# Patient Record
Sex: Male | Born: 1955 | Race: White | Hispanic: No | Marital: Married | Smoking: Never smoker
Health system: Southern US, Community
[De-identification: ages and names within clinical notes are randomized; demographics above are authoritative.]

## PROBLEM LIST (undated history)

## (undated) DIAGNOSIS — C01 Malignant neoplasm of base of tongue: Principal | ICD-10-CM

## (undated) DIAGNOSIS — G4730 Sleep apnea, unspecified: Secondary | ICD-10-CM

## (undated) DIAGNOSIS — Z8719 Personal history of other diseases of the digestive system: Secondary | ICD-10-CM

## (undated) DIAGNOSIS — G4733 Obstructive sleep apnea (adult) (pediatric): Secondary | ICD-10-CM

## (undated) DIAGNOSIS — R5383 Other fatigue: Secondary | ICD-10-CM

## (undated) DIAGNOSIS — Z9989 Dependence on other enabling machines and devices: Secondary | ICD-10-CM

## (undated) DIAGNOSIS — K222 Esophageal obstruction: Secondary | ICD-10-CM

## (undated) DIAGNOSIS — R413 Other amnesia: Secondary | ICD-10-CM

## (undated) DIAGNOSIS — A159 Respiratory tuberculosis unspecified: Secondary | ICD-10-CM

## (undated) DIAGNOSIS — Z8711 Personal history of peptic ulcer disease: Secondary | ICD-10-CM

## (undated) DIAGNOSIS — Z923 Personal history of irradiation: Secondary | ICD-10-CM

## (undated) DIAGNOSIS — J329 Chronic sinusitis, unspecified: Secondary | ICD-10-CM

## (undated) DIAGNOSIS — E079 Disorder of thyroid, unspecified: Secondary | ICD-10-CM

## (undated) HISTORY — PX: UPPER GASTROINTESTINAL ENDOSCOPY: SHX188

## (undated) HISTORY — DX: Malignant neoplasm of base of tongue: C01

## (undated) HISTORY — DX: Other fatigue: R53.83

## (undated) HISTORY — PX: FOOT FRACTURE SURGERY: SHX645

## (undated) HISTORY — DX: Chronic sinusitis, unspecified: J32.9

## (undated) HISTORY — DX: Personal history of irradiation: Z92.3

## (undated) HISTORY — PX: APPENDECTOMY: SHX54

## (undated) HISTORY — PX: HEEL SPUR SURGERY: SHX665

## (undated) HISTORY — PX: INTERPHALANGEAL JOINT ARTHROPLASTY: SUR71

## (undated) HISTORY — DX: Disorder of thyroid, unspecified: E07.9

---

## 1975-02-08 HISTORY — PX: CHEST WALL TUMOR EXCISION: SUR562

## 1980-02-08 HISTORY — PX: VASECTOMY: SHX75

## 1982-02-07 DIAGNOSIS — A159 Respiratory tuberculosis unspecified: Secondary | ICD-10-CM

## 1982-02-07 HISTORY — DX: Respiratory tuberculosis unspecified: A15.9

## 1997-12-06 ENCOUNTER — Emergency Department (HOSPITAL_COMMUNITY): Admission: EM | Admit: 1997-12-06 | Discharge: 1997-12-06 | Payer: Self-pay | Admitting: Emergency Medicine

## 1998-06-05 ENCOUNTER — Ambulatory Visit: Admission: RE | Admit: 1998-06-05 | Discharge: 1998-06-05 | Payer: Self-pay | Admitting: *Deleted

## 1999-05-15 ENCOUNTER — Encounter: Payer: Self-pay | Admitting: Emergency Medicine

## 1999-05-15 ENCOUNTER — Emergency Department (HOSPITAL_COMMUNITY): Admission: EM | Admit: 1999-05-15 | Discharge: 1999-05-15 | Payer: Self-pay | Admitting: *Deleted

## 2006-07-06 ENCOUNTER — Emergency Department (HOSPITAL_COMMUNITY): Admission: EM | Admit: 2006-07-06 | Discharge: 2006-07-07 | Payer: Self-pay | Admitting: Emergency Medicine

## 2007-02-15 ENCOUNTER — Ambulatory Visit (HOSPITAL_BASED_OUTPATIENT_CLINIC_OR_DEPARTMENT_OTHER): Admission: RE | Admit: 2007-02-15 | Discharge: 2007-02-15 | Payer: Self-pay | Admitting: Orthopedic Surgery

## 2010-02-03 ENCOUNTER — Emergency Department (HOSPITAL_COMMUNITY)
Admission: EM | Admit: 2010-02-03 | Discharge: 2010-02-03 | Payer: Self-pay | Source: Home / Self Care | Admitting: Emergency Medicine

## 2010-06-22 NOTE — Op Note (Signed)
NAME:  [name], [name]               ACCOUNT NO.:  [id number]   MEDICAL RECORD NO.:  [id number]          PATIENT TYPE:  AMB   LOCATION:  DSC                          FACILITY:  [facility]   PHYSICIAN:  Katy Fitch. Sypher, M.D. DATE OF BIRTH:  October 15, 1955   DATE OF PROCEDURE:  02/15/2007  DATE OF DISCHARGE:                               OPERATIVE REPORT   PREOPERATIVE DIAGNOSES:  1. Severe pain right long finger metacarpal phalangeal joint with loss      of range of motion due to bone-on-bone arthropathy.  2. 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   POSTOPERATIVE DIAGNOSES:  1. Severe pain right long finger metacarpal phalangeal joint with loss      of range of motion due to bone-on-bone arthropathy.  2. 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   OPERATION:  1. Reconstruction of right long finger metacarpal phalangeal joint      utilizing a size 30 DePuy NeuFlex implant arthroplasty.  2. Injection of right ring finger metacarpal phalangeal joint.   OPERATIONS:  Katy Fitch. Sypher, M.D.   ASSISTANT:  Molly Maduro Dasnoit PA-C.   ANESTHESIA:  General by LMA.   SUPERVISING ANESTHESIOLOGIST:  Dr. Jairo Ben.   INDICATIONS:  [name] [name] is a 55 year old gentleman referred through  the courtesy of Dr. Marcene Corning for evaluation and management of  relentless pain in the right long and ring finger metacarpal phalangeal  joints.  [name] [name] had not responded to nonoperative measures including  activity modification, anti-inflammatory medication and injection  therapy.  Clinical examination revealed signs of subluxation of the  right long finger metacarpal phalangeal joint, intrinsic tightness and  loss of range of motion compared with the opposite left long finger.  Plain x-rays including a [place] view demonstrated significant  subluxation and narrowing of the joint consistent with advanced  osteoarthrosis.   Due to a failure to respond to nonoperative measures, he is brought to  the operating room at this time for reconstruction of his right long  finger metacarpal phalangeal joint.   Due to a similar predicament in the ring finger without evidence of bone-  on-bone arthropathy, we recommended injecting the ring finger metacarpal  phalangeal joint with steroid while under anesthesia.   Preoperatively, questions were invited and answered in detail with Mr.  [name] and his wife [name] [name].   PROCEDURE:  [name] [name] is brought to the operating room and placed in  the supine position upon the table.  Following an anesthesia consult  with Dr. Jairo Ben, general anesthesia by LMA technique was  advised and accepted by [name] [name].  He was brought to room #8, placed in  the supine position on the table and under Dr. Edison Pace direct  supervision, general anesthesia by LMA technique induced.  He was  carefully positioned with his right arm on a hand table and a pneumatic  tourniquet was applied to the proximal right brachium.  There was 1 gram  of vancomycin provided as an IV prophylactic antibiotic preoperatively.  The  right arm was prepped with Betadine soap and solution and sterilely  draped.  Following exsanguination of the right arm with an Esmarch  bandage, the arterial tourniquet was inflated to 240 mmHg.  The  procedure commenced with a long curvilinear incision exposing the  extensor mechanism over the right long finger metacarpal phalangeal  joint.   The extensor mechanism was split in the midline and the capsule exposed.  The capsule was split in the midline and elevated to expose the  collateral ligaments.  The ulnar collateral ligament was released  approximately 60%, the radial collateral ligament released approximately  40%, blunt retractors were placed.  The joint surfaces were inspected.  The dorsal and radial aspect of the long finger metacarpal head  hyaline  cartilage present.  The ulnar and palmar aspect was eburnated bone.  Similarly in the ulnar and inferior quadrant of the long finger proximal  phalanx, there was complete loss of hyaline cartilage.   Taking care to plan the osteotomy of the long finger metacarpal head, a  Glorious Peach was placed to protect the volar plate and flexor tendons followed  by use of an oscillating saw to remove the cartilaginous portion of the  long finger metacarpal head at approximately a 10 degree oblique angle  short palmar.  Remnants of the hyaline cartilage within the joint were  removed and osteophytes removed with a fine rongeur.  A complete  synovectomy was accomplished and the volar plate was gently released  with a Therapist, nutritional.  The metacarpal neck and shaft and the proximal  phalangeal neck and shaft were prepped with hand reamers and a Swanson  high-speed bur to accept a size 30 DePuy NeuFlex trial.  Excellent  stability of the joint was achieved with proper tensioning of the  collateral ligaments.   After irrigation and removal of all debris, the size 30 implant was  placed with no-touch technique followed by repair of the capsule with  figure-of-eight sutures of 4-0 Vicryl, followed by reconstruction of the  extensor mechanism with figure-of-eight sutures of 3-0 Ethibond and a  running 3-0 Ethibond.  The skin was repaired with intradermal 3-0  Prolene.  The ring finger MP joint was then distracted in extension and  injected with a mixture of Depo-Medrol 40 mg/mL and 2% lidocaine for  total volume 1.5 mL.  Good distention the joint was achieved.   The hand was then immobilized in a voluminous gauze dressing with  sandwich splints maintaining the MP joint at 30 degrees flexion.  There  were no apparent complications.   [name] [name] was awakened from anesthesia and transferred to the recovery  room with stable signs.   He will be discharged prescriptions for  1. Doxycycline 1 mg p.o. b.i.d.  x4 days as a prophylactic antibiotic.  2. Motrin 600 mg one p.o. q.6h. p.r.n. pain, 30 tablets with one      refill.  3. Dilaudid 2 mg one or two tablets p.o. q.4-6h. p.r.n. pain, 30      tablets without refill.      Katy Fitch Sypher, M.D.  Electronically Signed    RVS/MEDQ  D:  02/15/2007  T:  02/15/2007  Job:  [id number]   cc:   C. Duane Lope, M.D.

## 2013-09-05 ENCOUNTER — Encounter (HOSPITAL_COMMUNITY): Payer: Self-pay | Admitting: Pharmacy Technician

## 2013-09-05 ENCOUNTER — Other Ambulatory Visit: Payer: Self-pay | Admitting: Otolaryngology

## 2013-09-05 DIAGNOSIS — R590 Localized enlarged lymph nodes: Secondary | ICD-10-CM

## 2013-09-05 DIAGNOSIS — J029 Acute pharyngitis, unspecified: Secondary | ICD-10-CM

## 2013-09-06 ENCOUNTER — Ambulatory Visit
Admission: RE | Admit: 2013-09-06 | Discharge: 2013-09-06 | Disposition: A | Discharge status: Home / Self Care | Payer: BC Managed Care – PPO | Source: Ambulatory Visit | Attending: Otolaryngology | Admitting: Otolaryngology

## 2013-09-06 DIAGNOSIS — R590 Localized enlarged lymph nodes: Secondary | ICD-10-CM

## 2013-09-06 DIAGNOSIS — J029 Acute pharyngitis, unspecified: Secondary | ICD-10-CM

## 2013-09-06 MED ORDER — IOHEXOL 300 MG/ML  SOLN
75.0000 mL | Freq: Once | INTRAMUSCULAR | Status: AC | PRN
  Administered 2013-09-06: 75 mL via INTRAVENOUS

## 2013-09-06 NOTE — H&P (Signed)
Assessment  Referred otalgia, right (388.72) (H92.01). Sore throat (462) (J02.9). Cervical lymphadenopathy (785.6) (R59.0). Orders  CT Neck with contrast; Requested for: 04 Sep 2013. 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 Recommend a CT evaluation followed by endoscopy and biopsy under anesthesia. Reason For Visit  Pt here for evaluation of right ear pain and sore throat. HPI  6-8 month history of right-sided sore throat and referred ear pain. He has been on several different antibiotics and prednisone. Prednisone helps temporarily. Otherwise the symptoms have been persistent. He is also noticed that his sense of taste is diminished. Allergies  Codeine Derivatives; Adverse Reaction; Nausea and Vomiting Velosef CAPS; Allergy; Hives. Current Meds  Aleve 220 MG Oral Tablet;TAKE 1 TABLET TWICE DAILY AS NEEDED.; RPT Ibuprofen TABS;; RPT. Active Problems  Head injury   07 Jul 2006 (959.01) (S09.90XA); playing softball Memory loss   07 Jul 2006 (780.93) (R41.3); head injury. [facility]. Family Hx  No pertinent family history: Mother. Personal Hx  Caffeine use (V49.89) (F15.90) Never a smoker No alcohol use. ROS  Systemic: Not feeling tired (fatigue).  No fever, no night sweats, and no recent weight loss. Head: No headache. Eyes: No eye symptoms. Otolaryngeal: No hearing loss.  Earache.  No tinnitus  and no purulent nasal discharge.  No nasal passage blockage (stuffiness).  Snoring.  No sneezing  and no hoarseness.  Sore throat. Cardiovascular: No chest pain or discomfort  and no palpitations. Pulmonary: No dyspnea, no cough, and no wheezing. Gastrointestinal: Dysphagia.  No heartburn.  No nausea, no abdominal pain, and no melena.  No diarrhea. Genitourinary: No dysuria. Endocrine: No muscle weakness. Musculoskeletal: No calf muscle cramps, no arthralgias, and no soft tissue swelling. Neurological: No dizziness, no fainting, no  tingling, and no numbness. Psychological: No anxiety  and no depression. Skin: No rash. 12 system ROS was obtained and reviewed on the Health Maintenance form dated today.  Positive responses are shown above.  If the symptom is not checked, the patient has denied it. Vital Signs   Recorded by Sallyanne Kuster on 04 Sep 2013 02:52 PM BP:141/80,  HR: 69 b/min,  Height: 5 ft 5 in, Weight: 205 lb , BMI: 34.1 kg/m2,  BMI Calculated: 34.11 ,  BSA Calculated: 2.00. Physical Exam  APPEARANCE: Well developed, well nourished, in no acute distress.  Normal affect, in a pleasant mood.  Oriented to time, place and person. COMMUNICATION: Normal voice   HEAD & FACE:  No scars, lesions or masses of head and face.  Sinuses nontender to palpation.  Salivary glands without mass or tenderness.  Facial strength symmetric.  No facial lesion, scars, or mass. EYES: EOMI with normal primary gaze alignment. Visual acuity grossly intact.  PERRLA EXTERNAL EAR & NOSE: No scars, lesions or masses  EAC & TYMPANIC MEMBRANE:  EAC shows no obstructing lesions or debris and tympanic membranes are normal bilaterally with good movement to insufflation. GROSS HEARING: Normal   TMJ:  Nontender  INTRANASAL EXAM: No polyps or purulence.  NASOPHARYNX: Normal, without lesions. LIPS, TEETH & GUMS: No lip lesions, normal dentition and normal gums. ORAL CAVITY/OROPHARYNX:  Oral mucosa moist without lesion or asymmetry of the palate, tongue, tonsil or posterior pharynx. Intense gag reflex precludes indirect exam. NECK:  Supple without adenopathy or mass. THYROID:  Normal with no masses palpable.  NEUROLOGIC:  No gross CN deficits. No nystagmus noted.   LYMPHATIC: 2 cm level II node anteriorly on the right, otherwise no palpable  nodes. Procedure  Fiberoptic Laryngoscopy Name: [name] Wu     Age: 58 year     The risks and benefits of this procedure have been thoroughly discussed with the patient/parent.  The most commons risks  outlined included but were not limited to: injury  to the nasal mucosa or throat irritation.  The patient/parent was further informed that there are other less common risks.  The patient/parent was given the opportunity to ask questions and all such questions were answered to the patient/parent's satisfaction.  Patient/parent acknowledged the risks and has agreed to proceed.   Performing Provider: Izora Gala The risks of the procedure are minimal and were discussed with the patient today. Pre-op Diagnosis: sore throat  Post-op Diagnosis:Same Allergy:  reviewed allergies as listed Nasal Prep:Lidocaine/Afrin   Procedure:     With the patient seated in the exam chair, the L nasal cavity was intubated with the flexible laryngoscope.  The nasal cavity mucosa, nasopharynx, hypopharynx and larynx were all examined with findings as noted.  The scope was then removed.  The patient tolerated the procedure well without complication or blood loss (unless indicated in findings).   FINDINGS: Nasopharynx has fullness of the lymphoid tissue but with good symmetry and no ulceration. Palatine tonsils are enlarged but symmetric. Base of tongue full, lingual tonsil also enlarged but symmetric. Very crowded oropharynx, difficult to evaluate spaces within structures. Vallecula, larynx, hypopharynx all clear. . Signature  Electronically signed by : Izora Gala  M.D.; 09/04/2013 3:15 PM EST.

## 2013-09-07 HISTORY — PX: TONGUE BIOPSY: SHX1075

## 2013-09-09 ENCOUNTER — Encounter (HOSPITAL_COMMUNITY)
Admission: RE | Admit: 2013-09-09 | Discharge: 2013-09-09 | Disposition: A | Discharge status: Home / Self Care | Payer: BC Managed Care – PPO | Source: Ambulatory Visit | Attending: Anesthesiology | Admitting: Anesthesiology

## 2013-09-09 ENCOUNTER — Encounter (HOSPITAL_COMMUNITY)
Admission: RE | Admit: 2013-09-09 | Discharge: 2013-09-09 | Disposition: A | Discharge status: Home / Self Care | Payer: BC Managed Care – PPO | Source: Ambulatory Visit | Attending: Otolaryngology | Admitting: Otolaryngology

## 2013-09-09 ENCOUNTER — Encounter (HOSPITAL_COMMUNITY): Payer: Self-pay

## 2013-09-09 DIAGNOSIS — Z885 Allergy status to narcotic agent status: Secondary | ICD-10-CM | POA: Diagnosis not present

## 2013-09-09 DIAGNOSIS — C01 Malignant neoplasm of base of tongue: Secondary | ICD-10-CM | POA: Diagnosis not present

## 2013-09-09 DIAGNOSIS — J029 Acute pharyngitis, unspecified: Secondary | ICD-10-CM | POA: Diagnosis present

## 2013-09-09 DIAGNOSIS — G4730 Sleep apnea, unspecified: Secondary | ICD-10-CM | POA: Diagnosis not present

## 2013-09-09 HISTORY — DX: Respiratory tuberculosis unspecified: A15.9

## 2013-09-09 HISTORY — DX: Sleep apnea, unspecified: G47.30

## 2013-09-09 HISTORY — DX: Other amnesia: R41.3

## 2013-09-09 LAB — BASIC METABOLIC PANEL
Anion gap: 10 (ref 5–15)
BUN: 21 mg/dL (ref 6–23)
CO2: 26 mEq/L (ref 19–32)
Calcium: 9.4 mg/dL (ref 8.4–10.5)
Chloride: 106 mEq/L (ref 96–112)
Creatinine, Ser: 1.41 mg/dL — ABNORMAL HIGH (ref 0.50–1.35)
GFR calc Af Amer: 62 mL/min — ABNORMAL LOW (ref >90)
GFR calc non Af Amer: 53 mL/min — ABNORMAL LOW (ref >90)
Glucose, Bld: 91 mg/dL (ref 70–99)
Potassium: 4.9 mEq/L (ref 3.7–5.3)
Sodium: 142 mEq/L (ref 137–147)

## 2013-09-09 LAB — CBC
HCT: 47.9 % (ref 39.0–52.0)
Hemoglobin: 16.3 g/dL (ref 13.0–17.0)
MCH: 31.5 pg (ref 26.0–34.0)
MCHC: 34 g/dL (ref 30.0–36.0)
MCV: 92.5 fL (ref 78.0–100.0)
Platelets: 269 10*3/uL (ref 150–400)
RBC: 5.18 MIL/uL (ref 4.22–5.81)
RDW: 13.1 % (ref 11.5–15.5)
WBC: 8.9 10*3/uL (ref 4.0–10.5)

## 2013-09-09 NOTE — Pre-Procedure Instructions (Signed)
[name] Wu  09/09/2013   Your procedure is scheduled on:  Thursday August 6 th at 0730 AM  Report to [facility] at 0530 AM.  Call this number if you have problems the morning of surgery: [phone]   Remember:   Do not eat food or drink liquids after midnight.   Take these medicines the morning of surgery with A SIP OF WATER: Tylenol if needed for pain  Stop Ibuprofen, Advil ,Motrin now.  Do not wear jewelry.  Do not wear lotions, powders, or colognes. You may wear deodorant.             Men may shave face and neck.  Do not bring valuables to the hospital.  [facility] is not responsible  for any belongings or valuables.               Contacts, dentures or bridgework may not be worn into surgery.  Leave suitcase in the car. After surgery it may be brought to your room.  For patients admitted to the hospital, discharge time is determined by your  treatment team.               Patients discharged the day of surgery will not be allowed to drive home.    Special Instructions:  - Preparing for Surgery  Before surgery, you can play an important role.  Because skin is not sterile, your skin needs to be as free of germs as possible.  You can reduce the number of germs on you skin by washing with CHG (chlorahexidine gluconate) soap before surgery.  CHG is an antiseptic cleaner which kills germs and bonds with the skin to continue killing germs even after washing.  Please DO NOT use if you have an allergy to CHG or antibacterial soaps.  If your skin becomes reddened/irritated stop using the CHG and inform your nurse when you arrive at Short Stay.  Do not shave (including legs and underarms) for at least 48 hours prior to the first CHG shower.  You may shave your face.  Please follow these instructions carefully:   1.  Shower with CHG Soap the night before surgery and the                                morning of Surgery.  2.  If you choose to wash your  hair, wash your hair first as usual with your       normal shampoo.  3.  After you shampoo, rinse your hair and body thoroughly to remove the                      Shampoo.  4.  Use CHG as you would any other liquid soap.  You can apply chg directly       to the skin and wash gently with scrungie or a clean washcloth.  5.  Apply the CHG Soap to your body ONLY FROM THE NECK DOWN.        Do not use on open wounds or open sores.  Avoid contact with your eyes,       ears, mouth and genitals (private parts).  Wash genitals (private parts)       with your normal soap.  6.  Wash thoroughly, paying special attention to the area where your surgery        will be performed.  7.  Thoroughly rinse your body with warm water from the neck down.  8.  DO NOT shower/wash with your normal soap after using and rinsing off       the CHG Soap.  9.  Pat yourself dry with a clean towel.            10.  Wear clean pajamas.            11.  Place clean sheets on your bed the night of your first shower and do not        sleep with pets.  Day of Surgery  Do not apply any lotions/deoderants the morning of surgery.  Please wear clean clothes to the hospital/surgery center.      Please read over the following fact sheets that you were given: Pain Booklet, Coughing and Deep Breathing and Surgical Site Infection Prevention

## 2013-09-09 NOTE — Progress Notes (Signed)
No sleep study at [facility]. Instructed patient to bring mask for CPAP day of surgery.

## 2013-09-10 NOTE — Progress Notes (Signed)
Call to pt., to inquire about where his sleep study was done. Last study was 10 yrs. Ago, states he thinks it was done in [place].  Setting on machine- 3.

## 2013-09-11 NOTE — Progress Notes (Signed)
Called and talked with patient's wife concerning surgery time change. Patient is to call OR at [phone] at 0800 AM for arrival time. Wife verbalized understanding.

## 2013-09-12 ENCOUNTER — Ambulatory Visit (HOSPITAL_COMMUNITY)
Admission: RE | Admit: 2013-09-12 | Discharge: 2013-09-12 | Disposition: A | Discharge status: Home Health | Payer: BC Managed Care – PPO | Source: Ambulatory Visit | Attending: Otolaryngology | Admitting: Otolaryngology

## 2013-09-12 ENCOUNTER — Encounter (HOSPITAL_COMMUNITY): Payer: Self-pay | Admitting: *Deleted

## 2013-09-12 ENCOUNTER — Encounter (HOSPITAL_COMMUNITY): Payer: BC Managed Care – PPO | Admitting: Anesthesiology

## 2013-09-12 ENCOUNTER — Encounter (HOSPITAL_COMMUNITY)
Admission: RE | Disposition: A | Discharge status: Home Health | Payer: Self-pay | Source: Ambulatory Visit | Attending: Otolaryngology

## 2013-09-12 ENCOUNTER — Ambulatory Visit (HOSPITAL_COMMUNITY): Payer: BC Managed Care – PPO | Admitting: Anesthesiology

## 2013-09-12 DIAGNOSIS — C01 Malignant neoplasm of base of tongue: Secondary | ICD-10-CM | POA: Insufficient documentation

## 2013-09-12 DIAGNOSIS — Z885 Allergy status to narcotic agent status: Secondary | ICD-10-CM | POA: Insufficient documentation

## 2013-09-12 DIAGNOSIS — G4730 Sleep apnea, unspecified: Secondary | ICD-10-CM | POA: Insufficient documentation

## 2013-09-12 HISTORY — PX: ESOPHAGOSCOPY: SHX5534

## 2013-09-12 HISTORY — PX: DIRECT LARYNGOSCOPY: SHX5326

## 2013-09-12 SURGERY — LARYNGOSCOPY, DIRECT
Anesthesia: General

## 2013-09-12 MED ORDER — PROMETHAZINE HCL 25 MG RE SUPP: 25.0000 mg | Freq: Four times a day (QID) | RECTAL | Status: DC | PRN

## 2013-09-12 MED ORDER — LIDOCAINE HCL (CARDIAC) 20 MG/ML IV SOLN
INTRAVENOUS | Status: AC
  Filled 2013-09-12: qty 5

## 2013-09-12 MED ORDER — EPINEPHRINE HCL (NASAL) 0.1 % NA SOLN
NASAL | Status: AC
  Filled 2013-09-12: qty 30

## 2013-09-12 MED ORDER — MIDAZOLAM HCL 2 MG/2ML IJ SOLN
INTRAMUSCULAR | Status: AC
  Filled 2013-09-12: qty 2

## 2013-09-12 MED ORDER — PROPOFOL 10 MG/ML IV BOLUS
INTRAVENOUS | Status: DC | PRN
  Administered 2013-09-12: 150 mg via INTRAVENOUS
  Administered 2013-09-12: 50 mg via INTRAVENOUS

## 2013-09-12 MED ORDER — FENTANYL CITRATE 0.05 MG/ML IJ SOLN
INTRAMUSCULAR | Status: DC
Start: 2013-09-12 — End: 2013-09-12
  Filled 2013-09-12: qty 2

## 2013-09-12 MED ORDER — LACTATED RINGERS IV SOLN
INTRAVENOUS | Status: DC | PRN
  Administered 2013-09-12 (×2): via INTRAVENOUS

## 2013-09-12 MED ORDER — LACTATED RINGERS IV SOLN
Freq: Once | INTRAVENOUS | Status: AC
  Administered 2013-09-12: 15:00:00 via INTRAVENOUS

## 2013-09-12 MED ORDER — LIDOCAINE-EPINEPHRINE 1 %-1:100000 IJ SOLN
INTRAMUSCULAR | Status: AC
  Filled 2013-09-12: qty 1

## 2013-09-12 MED ORDER — ONDANSETRON HCL 4 MG/2ML IJ SOLN
INTRAMUSCULAR | Status: DC | PRN
  Administered 2013-09-12: 4 mg via INTRAVENOUS

## 2013-09-12 MED ORDER — SUCCINYLCHOLINE CHLORIDE 20 MG/ML IJ SOLN
INTRAMUSCULAR | Status: DC | PRN
  Administered 2013-09-12 (×2): 100 mg via INTRAVENOUS

## 2013-09-12 MED ORDER — LIDOCAINE HCL (CARDIAC) 20 MG/ML IV SOLN
INTRAVENOUS | Status: DC | PRN
  Administered 2013-09-12: 70 mg via INTRAVENOUS

## 2013-09-12 MED ORDER — PROPOFOL 10 MG/ML IV BOLUS
INTRAVENOUS | Status: AC
  Filled 2013-09-12: qty 20

## 2013-09-12 MED ORDER — FENTANYL CITRATE 0.05 MG/ML IJ SOLN
INTRAMUSCULAR | Status: DC | PRN
  Administered 2013-09-12: 50 ug via INTRAVENOUS
  Administered 2013-09-12: 100 ug via INTRAVENOUS

## 2013-09-12 MED ORDER — MIDAZOLAM HCL 5 MG/5ML IJ SOLN
INTRAMUSCULAR | Status: DC | PRN
  Administered 2013-09-12: 2 mg via INTRAVENOUS

## 2013-09-12 MED ORDER — FENTANYL CITRATE 0.05 MG/ML IJ SOLN
INTRAMUSCULAR | Status: AC
  Filled 2013-09-12: qty 5

## 2013-09-12 MED ORDER — EPINEPHRINE HCL (NASAL) 0.1 % NA SOLN
NASAL | Status: DC | PRN
  Administered 2013-09-12: 5 mL via NASAL

## 2013-09-12 MED ORDER — FENTANYL CITRATE 0.05 MG/ML IJ SOLN
25.0000 ug | INTRAMUSCULAR | Status: DC | PRN
  Administered 2013-09-12: 50 ug via INTRAVENOUS

## 2013-09-12 MED ORDER — HYDROCODONE-ACETAMINOPHEN 7.5-325 MG PO TABS: 1.0000 | ORAL_TABLET | Freq: Four times a day (QID) | ORAL | Status: DC | PRN

## 2013-09-12 SURGICAL SUPPLY — 20 items
BALLN PULM 15 16.5 18X75 (BALLOONS)
BALLOON PULM 15 16.5 18X75 (BALLOONS) IMPLANT
CANISTER SUCTION 2500CC (MISCELLANEOUS) ×2 IMPLANT
COVER TABLE BACK 60X90 (DRAPES) ×2 IMPLANT
DRAPE PROXIMA HALF (DRAPES) ×2 IMPLANT
GAUZE SPONGE 4X4 12PLY STRL (GAUZE/BANDAGES/DRESSINGS) ×2 IMPLANT
GLOVE ECLIPSE 7.5 STRL STRAW (GLOVE) ×2 IMPLANT
GUARD TEETH (MISCELLANEOUS) ×1 IMPLANT
KIT BASIN OR (CUSTOM PROCEDURE TRAY) ×2 IMPLANT
KIT ROOM TURNOVER OR (KITS) ×2 IMPLANT
MARKER SKIN DUAL TIP RULER LAB (MISCELLANEOUS) IMPLANT
NS IRRIG 1000ML POUR BTL (IV SOLUTION) ×2 IMPLANT
PAD ARMBOARD 7.5X6 YLW CONV (MISCELLANEOUS) ×4 IMPLANT
PATTIES SURGICAL .5 X3 (DISPOSABLE) IMPLANT
SPECIMEN JAR SMALL (MISCELLANEOUS) IMPLANT
SYR CONTROL 10ML LL (SYRINGE) IMPLANT
SYR TB 1ML LUER SLIP (SYRINGE) IMPLANT
TOWEL OR 17X24 6PK STRL BLUE (TOWEL DISPOSABLE) ×2 IMPLANT
TUBE CONNECTING 12X1/4 (SUCTIONS) ×2 IMPLANT
WATER STERILE IRR 1000ML POUR (IV SOLUTION) ×2 IMPLANT

## 2013-09-12 NOTE — Anesthesia Preprocedure Evaluation (Addendum)
Anesthesia Evaluation  Patient identified by MRN, date of birth, ID band Patient awake  General Assessment Comment:History noted. CE  Reviewed: Allergy & Precautions, H&P , NPO status , Patient's Chart, lab work & pertinent test results  Airway Mallampati: II TM Distance: >3 FB Neck ROM: Full    Dental  (+) Poor Dentition, Missing, Dental Advisory Given   Pulmonary sleep apnea ,          Cardiovascular Rhythm:Regular Rate:Normal     Neuro/Psych    GI/Hepatic   Endo/Other    Renal/GU      Musculoskeletal   Abdominal   Peds  Hematology   Anesthesia Other Findings   Reproductive/Obstetrics                          Anesthesia Physical Anesthesia Plan  ASA: III  Anesthesia Plan: General   Post-op Pain Management:    Induction: Intravenous  Airway Management Planned: Oral ETT  Additional Equipment:   Intra-op Plan:   Post-operative Plan: Possible Post-op intubation/ventilation  Informed Consent: I have reviewed the patients History and Physical, chart, labs and discussed the procedure including the risks, benefits and alternatives for the proposed anesthesia with the patient or authorized representative who has indicated his/her understanding and acceptance.   Dental advisory given  Plan Discussed with: CRNA and Anesthesiologist  Anesthesia Plan Comments:        Anesthesia Quick Evaluation

## 2013-09-12 NOTE — Op Note (Signed)
OPERATIVE REPORT  DATE OF SURGERY: 09/12/2013  PATIENT:  [name] Wu,  58 y.o. male  PRE-OPERATIVE DIAGNOSIS:  SORE THROAT  POST-OPERATIVE DIAGNOSIS:  SORE THROAT  PROCEDURE:  Procedure(s): DIRECT LARYNGOSCOPY WITH BIOPSY  ESOPHAGOSCOPY  SURGEON:  Beckie Salts, MD  ASSISTANTS: none  ANESTHESIA:   General   EBL:  20 ml  DRAINS: none  LOCAL MEDICATIONS USED:  None  SPECIMEN:  Right base of tongue biopsy  COUNTS:  Correct  PROCEDURE DETAILS: The patient was taken to the operating room and placed on the operating table in the supine position. Following induction of general endotracheal anesthesia the table was turned 90 and the patient was draped in a standard fashion. A maxillary tooth protector was used throughout the case.  1. Rigid esophagoscopy. A cervical esophagoscope was entered into the oral cavity passed through cricopharyngeus and into the upper esophagus. It was slowly withdrawn while circumferentially inspecting the mucosa. Secretions were suctioned. There were no lesions identified.  2. Direct laryngoscopy with biopsy. Jako laryngoscope was used to view the larynx hypopharynx and the oropharynx. Larynx and hypopharynx were free of any lesions. The oropharynx revealed a firm friable mass involving the lateral base of tongue and inferior tonsillar fossa. Multiple biopsies were taken. It did not extend beyond the aforementioned sites. Topical adrenaline was used on pledgets for hemostasis and suction cautery used as well for completion of hemostasis. The scope was removed. The patient was awakened extubated and transferred to recovery in stable condition    PATIENT DISPOSITION:  To PACU, stable

## 2013-09-12 NOTE — Interval H&P Note (Signed)
History and Physical Interval Note:  09/12/2013 4:03 PM  [name] Wu  has presented today for surgery, with the diagnosis of SORE THROAT  The various methods of treatment have been discussed with the patient and family. After consideration of risks, benefits and other options for treatment, the patient has consented to  Procedure(s): DIRECT LARYNGOSCOPY WITH BIOPSY  (N/A) ESOPHAGOSCOPY (N/A) as a surgical intervention .  The patient's history has been reviewed, patient examined, no change in status, stable for surgery.  I have reviewed the patient's chart and labs.  Questions were answered to the patient's satisfaction.     ROSEN, JEFRY

## 2013-09-12 NOTE — Transfer of Care (Signed)
Immediate Anesthesia Transfer of Care Note  Patient: [name] Wu  Procedure(s) Performed: Procedure(s): DIRECT LARYNGOSCOPY WITH BIOPSY  (N/A) ESOPHAGOSCOPY (N/A)  Patient Location: PACU  Anesthesia Type:General  Level of Consciousness: awake, alert , oriented and patient cooperative  Airway & Oxygen Therapy: Patient Spontanous Breathing and Patient connected to nasal cannula oxygen  Post-op Assessment: Report given to PACU RN and Post -op Vital signs reviewed and stable  Post vital signs: Reviewed and stable  Complications: No apparent anesthesia complications

## 2013-09-12 NOTE — Discharge Instructions (Signed)
Resume normal activity tomorrow  Laryngoscopy A laryngoscopy is a procedure performed to view the back of the throat, vocal cords, and voice box (larynx). It may be done in order to figure out why a person has:  A cough.  Voice changes, such as new hoarseness.  Throat pain.  Problems swallowing. During a laryngoscopy, tissue samples (biopsies) can be taken or foreign bodies can be removed. A laryngoscopy can be done in the caregiver's office. It may be performed with a hand-held mirror, or it may require the use of a fiberoptic scope. Under some circumstances, it may be done in a hospital with medicine to help you sleep (general anesthesia). LET YOUR CAREGIVER KNOW ABOUT:   Allergies.  Medicines taken, including herbs, eyedrops, over-the-counter medicines, and creams.  Use of steroids (by mouth or creams).  Previous problems with anesthetics or numbing medicines.  History of bleeding or blood problems.  History of blood clots.  Possibility of pregnancy, if this applies.  Previous surgery.  Other health problems. RISKS AND COMPLICATIONS   Pain.  Gagging.  Vomiting.  Swelling.  Bleeding.  Problems from anesthesia. BEFORE THE PROCEDURE  Several days before the procedure, you may have blood tests to make sure the blood clots normally. You may be asked to stop taking blood thinners, aspirin, and/or nonsteroidal anti-inflammatory drugs (NSAIDs) before the procedure. Do not give aspirin to children. If general anesthesia is going to be used, you will usually be asked to stop eating and drinking at least 8 hours before the procedure. Have someone go with you to the procedure in order to drive you home afterward. PROCEDURE  If you are having the procedure in the caregiver's office, it is usually performed while sitting up in a special exam chair with a headrest. A numbing medicine will be sprayed into the mouth and on the back of the throat. Your caregiver will use a gauze pad  to hold the tongue out of the way. A mirror will be held at the back of the throat to allow your caregiver to see down the throat. You may be asked to make certain sounds so that vocal cord movement can be observed. If a fiberoptic scope is being used, it will be inserted into either the nose or the mouth and slipped into the throat. Your caregiver can look through an eyepiece or can see an image projected on a monitor. Pieces of tissue can be taken (biopsies) or foreign bodies removed. If you need to have the procedure performed under general anesthesia, you will be lying down on a special operating table. The same kinds of procedures will be followed, but you will not be aware of them. AFTER THE PROCEDURE   When laryngoscopy is done with only local numbing, it usually does not require any changes to your activity level after the procedure is complete.  You may have a sore throat.  You may be asked to rest the voice for some length of time after the procedure.  Do not smoke.  Follow your caregiver's directions regarding eating and drinking after the procedure.  If you had a biopsy taken, your caregiver may advise trying to avoid coughing, whispering, or clearing the throat.  If you were given a general anesthetic or a medicine to help you relax (sedative), you will be sleepy.  There may be some pain or you may feel sick to your stomach (nauseous), but this can usually be controlled with medicines taken by mouth.  You will stay in the  recovery room until awake and able to drink fluids.  You can go back to his or her usual level of activity within several days. HOME CARE INSTRUCTIONS   Take all medicines exactly as directed.  Follow any prescribed diet.  Follow instructions regarding rest (including voice rest) and physical activity.  Follow instructions for the use of throat lozenges or gargles. SEEK IMMEDIATE MEDICAL CARE IF:   You have severe pain.  You have new bleeding during  coughing, spitting, or vomiting.  You develop nausea and vomiting.  You develop new problems with swallowing.  You have difficulty breathing or have shortness of breath.  You have chest pain.  Your voice changes.  You have a fever.  You develop a cough. MAKE SURE YOU:   Understand these instructions.  Will watch your condition.  Will get help right away if you are not doing well or get worse. Document Released: 04/20/2009 Document Revised: 06/10/2013 Document Reviewed: 04/20/2009 [facility] Patient Information 2015 [place], [place]. This information is not intended to replace advice given to you by your health care provider. Make sure you discuss any questions you have with your health care provider. General Anesthesia, Care After Refer to this sheet in the next few weeks. These instructions provide you with information on caring for yourself after your procedure. Your health care provider may also give you more specific instructions. Your treatment has been planned according to current medical practices, but problems sometimes occur. Call your health care provider if you have any problems or questions after your procedure. WHAT TO EXPECT AFTER THE PROCEDURE After the procedure, it is typical to experience:  Sleepiness.  Nausea and vomiting. HOME CARE INSTRUCTIONS  For the first 24 hours after general anesthesia:  Have a responsible person with you.  Do not drive a car. If you are alone, do not take public transportation.  Do not drink alcohol.  Do not take medicine that has not been prescribed by your health care provider.  Do not sign important papers or make important decisions.  You may resume a normal diet and activities as directed by your health care provider.  Change bandages (dressings) as directed.  If you have questions or problems that seem related to general anesthesia, call the hospital and ask for the anesthetist or anesthesiologist on call. SEEK MEDICAL  CARE IF:  You have nausea and vomiting that continue the day after anesthesia.  You develop a rash. SEEK IMMEDIATE MEDICAL CARE IF:   You have difficulty breathing.  You have chest pain.  You have any allergic problems. Document Released: 05/02/2000 Document Revised: 01/29/2013 Document Reviewed: 08/09/2012 [facility] Patient Information 2015 [place], [place]. This information is not intended to replace advice given to you by your health care provider. Make sure you discuss any questions you have with your health care provider.

## 2013-09-13 ENCOUNTER — Encounter (HOSPITAL_COMMUNITY): Payer: Self-pay | Admitting: Otolaryngology

## 2013-09-13 DIAGNOSIS — C01 Malignant neoplasm of base of tongue: Secondary | ICD-10-CM

## 2013-09-13 HISTORY — DX: Malignant neoplasm of base of tongue: C01

## 2013-09-13 NOTE — Anesthesia Postprocedure Evaluation (Signed)
  Anesthesia Post-op Note  Patient: [name] Wu  Procedure(s) Performed: Procedure(s): DIRECT LARYNGOSCOPY WITH BIOPSY  (N/A) ESOPHAGOSCOPY (N/A)  Patient Location: PACU  Anesthesia Type:General  Level of Consciousness: awake  Airway and Oxygen Therapy: Patient Spontanous Breathing  Post-op Pain: mild  Post-op Assessment: Post-op Vital signs reviewed  Post-op Vital Signs: Reviewed  Last Vitals:  Filed Vitals:   09/12/13 1812  BP: 141/90  Pulse: 68  Temp: 36.2 C  Resp: 15    Complications: No apparent anesthesia complications

## 2013-09-20 ENCOUNTER — Encounter: Payer: Self-pay | Admitting: Radiation Oncology

## 2013-09-20 NOTE — Progress Notes (Signed)
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 Microscopic Comment Addendum: Immunohistochemistry is performed and the tumor shows diffuse, strong positivity with p16 (HPV).   Nutrition Status:  Weight changes:  none  Swallowing status: occasional difficulty swallowing, drinking pureed foods, but eats some solids as well  Plans, if any, for PEG tube: no  Tobacco/Marijuana/Snuff/ETOH use: no history of use  Past/Anticipated interventions by otolaryngology, if any: biopsy  Past/Anticipated interventions by medical oncology, if any: has not been seen,  no appointment scheduled as of 09/24/13  Referrals yet, to any of the following?  Social Work? no  Dentistry? Dr Enrique Sack 09/24/13  Swallowing therapy? no  Nutrition? no  Med/Onc? no  PEG placement? no  SAFETY ISSUES:  Prior radiation? no  Pacemaker/ICD? no  Possible current pregnancy? na  Is the patient on methotrexate? no  Current Complaints / other details:  Married, Midwife for [employer], children, grandchildren Patient states the right side of his face is in constant pain from beside ear to chin, 7/10 on pain scale despite taking Tramadol/ Ibuprofen every 6 hrs and Hydrocodone nightly. He states he has noticed white coating on tongue x 1 month with "different taste" in his mouth. Some white exudate noted towards back of tongue, foul odor to breath.

## 2013-09-23 ENCOUNTER — Telehealth: Payer: Self-pay | Admitting: *Deleted

## 2013-09-23 NOTE — Telephone Encounter (Signed)
Called pt to introduce myself as the oncology nurse navigator that works with Drs. Valere Dross and [place], pt at work, spoke with his wife.  I indicated that I would be joining them tomorrow during his appt with Dr. Valere Dross and Wednesday when they meet with Dr. Alvy Bimler.   I answered the wife's question about the usual tmt for this type of cancer, explained arrival/registration procedure. She verbalized understanding and expressed appreciation for my call.  Initiating navigation as L1 patient (new patient) with this encounter.  Gayleen Orem, RN, BSN, [place] at [place] [phone]

## 2013-09-24 ENCOUNTER — Other Ambulatory Visit: Payer: Self-pay | Admitting: Hematology and Oncology

## 2013-09-24 ENCOUNTER — Ambulatory Visit (HOSPITAL_BASED_OUTPATIENT_CLINIC_OR_DEPARTMENT_OTHER): Payer: BC Managed Care – PPO | Admitting: Hematology and Oncology

## 2013-09-24 ENCOUNTER — Ambulatory Visit (HOSPITAL_COMMUNITY): Payer: Self-pay | Admitting: Dentistry

## 2013-09-24 ENCOUNTER — Telehealth: Payer: Self-pay | Admitting: Hematology and Oncology

## 2013-09-24 ENCOUNTER — Ambulatory Visit
Admission: RE | Admit: 2013-09-24 | Discharge: 2013-09-24 | Disposition: A | Discharge status: Home / Self Care | Payer: BC Managed Care – PPO | Source: Ambulatory Visit | Attending: Radiation Oncology | Admitting: Radiation Oncology

## 2013-09-24 ENCOUNTER — Encounter (HOSPITAL_COMMUNITY): Payer: Self-pay | Admitting: Dentistry

## 2013-09-24 ENCOUNTER — Encounter: Payer: Self-pay | Admitting: *Deleted

## 2013-09-24 ENCOUNTER — Other Ambulatory Visit: Payer: Self-pay | Admitting: *Deleted

## 2013-09-24 ENCOUNTER — Encounter: Payer: Self-pay | Admitting: Radiation Oncology

## 2013-09-24 ENCOUNTER — Ambulatory Visit: Payer: BC Managed Care – PPO

## 2013-09-24 ENCOUNTER — Encounter: Payer: Self-pay | Admitting: Hematology and Oncology

## 2013-09-24 ENCOUNTER — Ambulatory Visit: Payer: Self-pay | Admitting: Hematology and Oncology

## 2013-09-24 VITALS — BP 131/78 | HR 58 | Temp 98.6°F | Resp 18 | Ht 65.0 in | Wt 214.0 lb

## 2013-09-24 VITALS — BP 120/87 | HR 71 | Temp 98.4°F | Resp 20 | Ht 65.0 in | Wt 214.6 lb

## 2013-09-24 VITALS — BP 126/84 | HR 60 | Temp 98.2°F

## 2013-09-24 DIAGNOSIS — K036 Deposits [accretions] on teeth: Secondary | ICD-10-CM

## 2013-09-24 DIAGNOSIS — Z9089 Acquired absence of other organs: Secondary | ICD-10-CM | POA: Diagnosis not present

## 2013-09-24 DIAGNOSIS — M264 Malocclusion, unspecified: Secondary | ICD-10-CM

## 2013-09-24 DIAGNOSIS — Z9852 Vasectomy status: Secondary | ICD-10-CM | POA: Diagnosis not present

## 2013-09-24 DIAGNOSIS — Z0189 Encounter for other specified special examinations: Secondary | ICD-10-CM

## 2013-09-24 DIAGNOSIS — R7989 Other specified abnormal findings of blood chemistry: Secondary | ICD-10-CM | POA: Insufficient documentation

## 2013-09-24 DIAGNOSIS — R070 Pain in throat: Secondary | ICD-10-CM | POA: Insufficient documentation

## 2013-09-24 DIAGNOSIS — C029 Malignant neoplasm of tongue, unspecified: Secondary | ICD-10-CM | POA: Diagnosis not present

## 2013-09-24 DIAGNOSIS — K08409 Partial loss of teeth, unspecified cause, unspecified class: Secondary | ICD-10-CM

## 2013-09-24 DIAGNOSIS — K0530 Chronic periodontitis, unspecified: Secondary | ICD-10-CM

## 2013-09-24 DIAGNOSIS — R1310 Dysphagia, unspecified: Secondary | ICD-10-CM | POA: Insufficient documentation

## 2013-09-24 DIAGNOSIS — K121 Other forms of stomatitis: Secondary | ICD-10-CM | POA: Insufficient documentation

## 2013-09-24 DIAGNOSIS — M270 Developmental disorders of jaws: Secondary | ICD-10-CM

## 2013-09-24 DIAGNOSIS — R799 Abnormal finding of blood chemistry, unspecified: Secondary | ICD-10-CM

## 2013-09-24 DIAGNOSIS — Z510 Encounter for antineoplastic radiation therapy: Secondary | ICD-10-CM | POA: Diagnosis present

## 2013-09-24 DIAGNOSIS — C01 Malignant neoplasm of base of tongue: Secondary | ICD-10-CM

## 2013-09-24 DIAGNOSIS — IMO0002 Reserved for concepts with insufficient information to code with codable children: Secondary | ICD-10-CM

## 2013-09-24 DIAGNOSIS — C099 Malignant neoplasm of tonsil, unspecified: Secondary | ICD-10-CM

## 2013-09-24 MED ORDER — HYDROCODONE-ACETAMINOPHEN 7.5-325 MG PO TABS: 1.0000 | ORAL_TABLET | Freq: Four times a day (QID) | ORAL | Status: DC | PRN

## 2013-09-24 MED ORDER — TRAMADOL HCL 50 MG PO TABS: 50.0000 mg | ORAL_TABLET | Freq: Four times a day (QID) | ORAL | Status: DC | PRN

## 2013-09-24 NOTE — Progress Notes (Signed)
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 male is here because of newly diagnosed squamous cell carcinoma. According to the patient, the first initial presentation was due to persistent sore throat, swelling of the right cheek and neck and recent hoarseness. He was treated with multiple courses of prednisone and antibiotic therapy with brief resolution of pain and swelling. He also complained of brief episode of hoarseness of voice and significant painful swallowing. 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 There are right level 2 and level 3 adenopathy as described and right peritracheal lymph node within the superior mediastinum   09/12/2013 Surgery Laryngoscopy showed the oropharynx revealed a firm friable mass involving the lateral base of tongue and inferior tonsillar fossa.    09/13/2013 Pathology Results Accession: [id number] biopsy confirmed squamous cell carcinoma, HPV positive    he denies any hearing deficit, difficulties with chewing food, or abnormal weight loss.  MEDICAL HISTORY:  Past Medical History  Diagnosis Date  . Sleep apnea     uses CPAP  . Tuberculosis 1984    INH x 84yrs  . Amnesia     after being hitting head playing baseball, lasted 1 night  . Malignant neoplasm of base of tongue 09/13/13    right base of tongue- inv squamous cell      SURGICAL HISTORY: Past Surgical History  Procedure Laterality Date  . Appendectomy  1956    at [facility]  . Finger surgery Right     knuckle removed  . Foot surgery Left     x2  . Tumor removal      left chest-bengin  . Vasectomy    . Direct laryngoscopy N/A 09/12/2013    Procedure: DIRECT LARYNGOSCOPY WITH BIOPSY ;  Surgeon: Izora Gala, MD;  Location: [place];  Service: ENT;  Laterality: N/A;  . Esophagoscopy N/A 09/12/2013    Procedure: ESOPHAGOSCOPY;  Surgeon: Izora Gala, MD;  Location: [facility];  Service: ENT;  Laterality: N/A;    SOCIAL HISTORY: History   Social History  . Marital Status: Married    Spouse Name: N/A    Number of Children: 12  . Years of Education: N/A   Occupational History  .  [employer]   Social History Main Topics  . Smoking status: Never Smoker   . Smokeless tobacco: Never Used  . Alcohol Use: No  . Drug Use: No  . Sexual Activity: Not on file   Other Topics Concern  . Not on file   Social History Narrative   09/24/2013   Patient is married.   Patient has 2 children from his previous marriage and 3 stepchildren from this married. Patient has been married to his current wife for approximately 2 years.   Patient has never smoked, never used smokeless tobacco, and indicates that he does not use illicit drugs or alcohol.    FAMILY HISTORY:  Family History  Problem Relation Age of Onset  . Cancer Paternal Grandfather     ALLERGIES:  is allergic to codeine and velosef.  MEDICATIONS:  Current Outpatient Prescriptions  Medication Sig Dispense Refill  . acetaminophen (TYLENOL) 325 MG tablet Take 650 mg by mouth every 6 (six) hours as needed for mild pain, moderate pain or fever.      Marland Kitchen HYDROcodone-acetaminophen (NORCO) 7.5-325 MG per tablet Take 1 tablet by mouth every 6 (six) hours as needed for moderate pain.  80 tablet  0  . ibuprofen (ADVIL,MOTRIN) 200 MG tablet Take 600 mg by mouth every 6 (six) hours as needed for fever,  headache or mild pain.      . promethazine (PHENERGAN) 25 MG suppository Place 1 suppository (25 mg total) rectally every 6 (six) hours as needed for nausea or vomiting.  12 suppository  1  . traMADol (ULTRAM) 50 MG tablet Take 1 tablet (50 mg total) by mouth every 6 (six) hours as needed for moderate pain.  60 tablet  0   No current facility-administered medications for this visit.    REVIEW OF SYSTEMS:   Constitutional: Denies fevers, chills or abnormal night sweats Eyes: Denies blurriness of vision, double vision or watery eyes Respiratory: Denies cough, dyspnea or wheezes Cardiovascular: Denies palpitation, chest discomfort or lower extremity swelling Gastrointestinal:  Denies nausea, heartburn or change in bowel habits Skin: Denies abnormal skin rashes Neurological:Denies numbness, tingling or new weaknesses Behavioral/Psych: Mood is stable, no new changes  All other systems were reviewed with the patient and are negative.  PHYSICAL EXAMINATION: ECOG PERFORMANCE STATUS: 1 - Symptomatic but completely ambulatory  Filed Vitals:   09/24/13 1137  BP: 131/78  Pulse: 58  Temp: 98.6 F (37 C)  Resp: 18   Filed Weights   09/24/13 1137  Weight: 214 lb (97.07 kg)    GENERAL:alert, no distress and comfortable she is mildly obese SKIN: skin color, texture, turgor are normal, no rashes or significant lesions EYES: normal, conjunctiva are pink and non-injected, sclera clear OROPHARYNX:no exudate, no erythema and lips, buccal mucosa, and tongue normal  NECK: supple, thyroid normal size, non-tender, without nodularity LYMPH:  Significant swelling of the right side of his temple region with associated right cervical lymphadenopathy. LUNGS: clear to auscultation and percussion with normal breathing effort HEART: regular rate & rhythm and no murmurs and no lower extremity edema ABDOMEN:abdomen soft, non-tender and normal bowel sounds Musculoskeletal:no cyanosis of digits and no clubbing   PSYCH: alert & oriented x 3 with fluent speech NEURO: no focal motor/sensory deficits  LABORATORY DATA:  I have reviewed the data as listed Lab Results  Component Value Date   WBC 8.9 09/09/2013   HGB 16.3 09/09/2013   HCT 47.9 09/09/2013   MCV 92.5 09/09/2013   PLT 269 09/09/2013   Lab Results  Component Value Date   NA 142 09/09/2013   K 4.9 09/09/2013   CL 106 09/09/2013   CO2 26 09/09/2013    RADIOGRAPHIC STUDIES: I have personally reviewed the radiological images as listed and agreed with the findings in the report. Dg Chest 2 View  09/09/2013   CLINICAL DATA:  Sleep apnea.  History of tuberculosis.  EXAM: CHEST  2 VIEW  COMPARISON:  Abdominal CT 02/03/2010.  FINDINGS: The heart size and mediastinal contours are normal. The lungs are clear. There is no pleural effusion or pneumothorax. No acute osseous findings are identified.  IMPRESSION: No active cardiopulmonary process.   Electronically Signed  By: Camie Patience M.D.   On: 09/09/2013 14:14   Ct Soft Tissue Neck W Contrast  09/06/2013   CLINICAL DATA:  Cervical adenopathy. Right-sided sore throat. A preop exam for biopsy.  EXAM: CT NECK WITH CONTRAST  TECHNIQUE: Multidetector CT imaging of the neck was performed using the standard protocol following the bolus administration of intravenous contrast.  CONTRAST:  51mL OMNIPAQUE IOHEXOL 300 MG/ML  SOLN  COMPARISON:  Cervical spine CT 07/07/2006  FINDINGS: A heterogeneously enhancing right level 2 lymph node measures 2.0 x 1.5 x 1.2 cm. A right level 3 node or nodal mass measures 3.0 x 1.6 x 2.3 cm. This may represent up to 3 separate adjacent nodes. No other pathologically enlarged nodes are present in the neck. There is no significant left-sided adenopathy.  A base of tongue lesion is present on the right, measuring 2.0 x 3.1 x 4.2 cm. No other significant mucosal submucosal lesions are present.  The larynx is within normal limits. The vocal cords are midline and symmetric.  The thyroid is normal. The  right peritracheal lymph node at the thoracic inlet measures 2.1 x 1.6 cm. No other significant mediastinal lymph nodes are present.  Multilevel degenerative changes are present within the cervical spine with endplate sclerotic changes and uncovertebral spurring at C3-4 through C6-7. Osseous foraminal narrowing is present on the left at C3-4 and C6-7 and on the right at C4-5. Advanced degenerative change first is a remote tip of the dens fracture is noted. The lung apices are clear.  IMPRESSION: 1. 2.0 x 3.1 x 4.2 cm right base of tongue enhancing mass lesion is most concerning for a squamous cell cancer. 2. Right level 2 and level 3 adenopathy as described. 3. Right peritracheal lymph node within the superior mediastinum. 4. Moderate spondylosis of the cervical spine.   Electronically Signed   By: Lawrence Santiago M.D.   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   Prognosis would depend on the results for the PET/CT scan, to be discussed and reviewed in the next visit.   Treatment options would include chemotherapy only, radiation only or chemotherapy in combination with radiation therapy.    In preparation for treatment, the patient will need the following tests or referrals, to be arranged: General surgery consultation for port placement and feeding tube placement, chemotherapy education class and blood work. I will see him back in 2 weeks for further assessment and discussion the plan of care.    Throat pain in adult I discussed pain management with the patient. He is comfortable with the current pain medication regimen and declined in treatment dose of narcotics prescription. I refilled both tramadol and Percocet for him.  Dysphagia I recommend frequent small meals. He has been referred to see a speech and language therapist.  Elevated serum creatinine I do not know  whether this is chronic or related to recent dehydration. I will recheck it next week. Persistent elevated creatinine will make me very concerned about prescribing cisplatin based therapy for him.     Orders Placed This Encounter  Procedures  . CBC with Differential    Standing Status: Standing     Number of Occurrences: 9     Standing Expiration Date: 09/25/2014  . Comprehensive metabolic panel    Standing Status: Standing     Number of Occurrences: 9  Standing Expiration Date: 09/25/2014  . Ambulatory referral to General Surgery    Referral Priority:  Urgent    Referral Type:  Surgical    Referral Reason:  Specialty Services Required    Requested Specialty:  General Surgery    Number of Visits Requested:  1    All questions were answered. The patient knows to call the clinic with any problems, questions or concerns. I spent 55 minutes counseling the patient face to face. The total time spent in the appointment was 70 minutes and more than 50% was on counseling.     [facility], [place], MD 09/24/2013 9:27 PM

## 2013-09-24 NOTE — Progress Notes (Signed)
Met with patient and his wife during initial consult with Dr. Alvy Bimler to provide support and continuity from this morning's appt with Dr. Valere Dross. Assisted with post-consult appt scheduling.  Gayleen Orem, RN, BSN, [place] at [place] [phone]

## 2013-09-24 NOTE — Progress Notes (Signed)
Please see the Nurse Progress Note in the MD Initial Consult Encounter for this patient. 

## 2013-09-24 NOTE — Patient Instructions (Signed)

## 2013-09-24 NOTE — Assessment & Plan Note (Signed)
I do not know whether this is chronic or related to recent dehydration. I will recheck it next week. Persistent elevated creatinine will make me very concerned about prescribing cisplatin based therapy for him.

## 2013-09-24 NOTE — Assessment & Plan Note (Signed)
Stage of the disease is to be determined, a PET/CT scan has been ordered.   Prognosis would depend on the results for the PET/CT scan, to be discussed and reviewed in the next visit.   Treatment options would include chemotherapy only, radiation only or chemotherapy in combination with radiation therapy.    In preparation for treatment, the patient will need the following tests or referrals, to be arranged: General surgery consultation for port placement and feeding tube placement, chemotherapy education class and blood work. I will see him back in 2 weeks for further assessment and discussion the plan of care.

## 2013-09-24 NOTE — Assessment & Plan Note (Signed)
I discussed pain management with the patient. He is comfortable with the current pain medication regimen and declined in treatment dose of narcotics prescription. I refilled both tramadol and Percocet for him.

## 2013-09-24 NOTE — Addendum Note (Signed)
Encounter addended by: Andria Rhein, RN on: 09/24/2013 11:01 AM<BR>     Documentation filed: Charges VN, Visit Diagnoses

## 2013-09-24 NOTE — Assessment & Plan Note (Signed)
I recommend frequent small meals. He has been referred to see a speech and language therapist.

## 2013-09-24 NOTE — Telephone Encounter (Signed)
LEFT MESSAGE AND GAVE NP APPT FOR 08/18 @ 2:45 W/DR. [place]

## 2013-09-24 NOTE — Progress Notes (Signed)
DENTAL CONSULTATION  Date of Consultation:  09/24/2013 Patient Name:   CLIF SERIO Date of Birth:   1956-01-18 Medical Record Number: [id number]  VITALS: BP 126/84  Pulse 60  Temp(Src) 98.2 F (36.8 C) (Oral)   CHIEF COMPLAINT: Patient was referred for a medically necessary pre-chemoradiation therapy dental protocol examination.  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 Patient with anticipated chemoradiation therapy. Patient now seen as part of a medically necessary pre-chemoradiation therapy dental protocol examination.  The patient currently denies acute toothache, swellings, or abscesses. Patient was last seen in March of 2015 to have a lower left molar extracted. This was tooth #19. The extraction was performed by Dr. Lewanda Rife with [facility]. Patient denies having any complications associated with that dental extraction. Patient also had a dental exam and cleaning with Dr. Oneida Arenas, his primary Dentist on April 10, 2013. Patient indicates that he sees the dentist every 6 months for an exam and cleaning.  Patient has an upper acrylic partial denture replacing tooth #7 that seems to be OK".      PROBLEM LIST: Patient Active Problem List   Diagnosis Date Noted  . 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 Sleep apnea     uses CPAP  . Tuberculosis 1984    INH x 52yrs  . Amnesia     after being hitting head playing baseball, lasted 1 night  . Malignant neoplasm of base of tongue 09/13/13    right base of tongue- inv squamous cell     PSH: Past Surgical History  Procedure Laterality Date  . Appendectomy  1956    at [facility]  . Finger surgery Right     knuckle removed  . Foot surgery Left     x2  . Tumor removal      left chest-bengin  . Vasectomy    . Direct laryngoscopy N/A 09/12/2013    Procedure:  DIRECT LARYNGOSCOPY WITH BIOPSY ;  Surgeon: Izora Gala, MD;  Location: [place];  Service: ENT;  Laterality: N/A;  . Esophagoscopy N/A 09/12/2013    Procedure: ESOPHAGOSCOPY;  Surgeon: Izora Gala, MD;  Location: [place];  Service: ENT;  Laterality: N/A;    ALLERGIES: Allergies  Allergen Reactions  . Codeine Nausea And Vomiting  . Velosef [Cephradine] Rash    MEDICATIONS: Current Outpatient Prescriptions  Medication Sig Dispense Refill  . acetaminophen (TYLENOL) 325 MG tablet Take 650 mg by mouth every 6 (six) hours as needed for mild pain, moderate pain or fever.      Marland Kitchen HYDROcodone-acetaminophen (NORCO) 7.5-325 MG per tablet Take 1 tablet by mouth every 6 (six) hours as needed for moderate pain.  30 tablet  0  . ibuprofen (ADVIL,MOTRIN) 200 MG tablet Take 600 mg by mouth every 6 (six) hours as needed for fever, headache or mild pain.      . promethazine (PHENERGAN) 25 MG suppository Place 1 suppository (25 mg total) rectally every 6 (six) hours as needed for nausea or vomiting.  12 suppository  1  . traMADol (ULTRAM) 50 MG tablet Take by mouth every 6 (six) hours as needed.       No current facility-administered medications for this visit.    LABS: Lab Results  Component Value Date   WBC 8.9 09/09/2013   HGB 16.3 09/09/2013  HCT 47.9 09/09/2013   MCV 92.5 09/09/2013   PLT 269 09/09/2013      Component Value Date/Time   NA 142 09/09/2013 1339   K 4.9 09/09/2013 1339   CL 106 09/09/2013 1339   CO2 26 09/09/2013 1339   GLUCOSE 91 09/09/2013 1339   BUN 21 09/09/2013 1339   CREATININE 1.41* 09/09/2013 1339   CALCIUM 9.4 09/09/2013 1339   GFRNONAA 53* 09/09/2013 1339   GFRAA 62* 09/09/2013 1339   No results found for this basename: INR, PROTIME   No results found for this basename: PTT    SOCIAL HISTORY: History   Social History  . Marital Status: Married    Spouse Name: N/A    Number of Children: 17  . Years of Education: N/A   Occupational History  .  [employer]   Social  History Main Topics  . Smoking status: Never Smoker   . Smokeless tobacco: Never Used  . Alcohol Use: No  . Drug Use: No  . Sexual Activity: Not on file   Other Topics Concern  . Not on file   Social History Narrative   09/24/2013   Patient is married.   Patient has 2 children from his previous marriage and 3 stepchildren from this married. Patient has been married to his current wife for approximately 2 years.   Patient has never smoked, never used smokeless tobacco, and indicates that he does not use illicit drugs or alcohol.    FAMILY HISTORY: Family History  Problem Relation Age of Onset  . Cancer Paternal Grandfather     REVIEW OF SYSTEMS: Reviewed with patient and included in dental record.   DENTAL HISTORY: CHIEF COMPLAINT: Patient was referred for a medically necessary pre-chemoradiation therapy dental protocol examination.  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 Patient with anticipated chemoradiation therapy. Patient now seen as part of a medically necessary pre-chemoradiation therapy dental protocol examination.  The patient currently denies acute toothache, swellings, or abscesses. Patient was last seen in March of 2015 to have a lower left molar extracted. This was tooth #19. The extraction was performed by Dr. Lewanda Rife with [facility]. Patient denies having any complications associated with that dental extraction. Patient also had a dental exam and cleaning with Dr. Oneida Arenas, his primary Dentist on April 10, 2013. Patient indicates that he sees the dentist every 6 months for an exam and cleaning.  Patient has an upper acrylic partial denture replacing tooth #7 that seems to be OK".     DENTAL EXAMINATION: GENERAL: The patient is a well-developed, well-nourished male in no acute distress. HEAD AND NECK: The patient has right neck adenopathy. The patient denies acute TMJ  symptoms. INTRAORAL EXAM: The patient has normal saliva. The patient has bilateral mandibular lingual tori. The patient has erythema underneath the existing maxillary acrylic partial denture. Patient was instructed to take the partial denture out at bedtime and was given oral hygiene instructions. The patient has a severe gag reflex making obtaining dental examination and radiographs difficult. DENTITION: The patient is missing tooth numbers 1, 2, 7, 15, 16, 17, 19, and 32. Tooth #5 has significant rotation and malocclusion. PERIODONTAL: The patient has chronic periodontitis with plaque and calculus relations, selective areas gingival recession and incipient mandibular anterior tooth mobility.  DENTAL CARIES/SUBOPTIMAL RESTORATIONS: There are no obvious dental caries are noted. ENDODONTIC: The patient currently denies acute pulpitis symptoms. I do not  see any evidence of periapical pathology. CROWN AND BRIDGE: There are no crown or bridge restorations. PROSTHODONTIC: The patient has a maxillary acrylic partial denture replacing tooth #7. The patient has denture stomatitis underneath the existing maxillary acrylic partial denture. Patient was instructed to take the maxillary acrylic partial denture out at bedtime. Patient was also instructed on oral hygiene measures to improve soft tissues in this area. OCCLUSION: The patient has a poor occlusal scheme secondary to multiple missing teeth, supra-eruption and drifting of the unopposed teeth into the edentulous areas, rotation of tooth #5, and lack of replacement of all missing teeth with dental prostheses.  RADIOGRAPHIC INTERPRETATION:  An orthopantogram was taken today and supplemented with 6 periapical radiographs and two panoramic bitewings. I was unable to obtain a full series of dental radiographs secondary to significant gag reflex. There are multiple missing teeth. There is supra-eruption and drifting of the unopposed teeth into the edentulous areas.  There is incipient to moderate bone loss. Multiple dental restorations are noted. Healing in the recent dental extraction site #19 is noted.   ASSESSMENTS: 1. 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 Pre-chemoradiation therapy dental protocol 3. Chronic periodontitis with bone loss 4. Gingival recession 5. Accretions 6. Incipient tooth mobility 7. Bilateral mandibular lingual tori 8. Multiple missing teeth 9. Maxillary acrylic partial denture replacing tooth #7 10. Denture stomatitis underneath the maxillary acrylic partial denture 11. Supra-eruption and drifting of the unopposed teeth into the edentulous areas 12. Poor occlusal scheme but a stable malocclusion 13. Severe gag reflex  PLAN/RECOMMENDATIONS: 1. I discussed the risks, benefits, and complications of various treatment options with the patient in relationship to his medical and dental conditions, anticipated chemoradiation therapy, and chemoradiation therapy side effects to include xerostomia, radiation caries, trismus, mucositis, taste changes, gum and jawbone changes, and risk for infection, bleeding, and osteoradionecrosis. We discussed various treatment options to include no treatment, multiple extractions with alveoloplasty, bilateral mandibular lingual tori, periodontal therapy, dental restorations, root canal therapy, crown and bridge therapy, implant therapy, and replacement of missing teeth as indicated.  We also discussed fabrication of fluoride trays and scatter protection devices.The patient currently wishes to proceed with referral to an oral surgeon, Dr. Consuella Lose, for evaluation for extraction of tooth numbers 18, 30, and 31 with alveoloplasty and bilateral mandibular lingual tori reductions.  The patient is scheduled for consultation this Friday at 10:30 AM on 09/27/2013. The patient also will see if Dr. Oneida Arenas is able to see the patient for periodontal therapy this coming Thursday or Friday  morning.  The patient will then return to dental medicine for insertion of fluoride trays and scatter protection devices prior to the simulation appointment with Dr. Valere Dross. Patient scheduled to see Dr. Alvy Bimler today and have a PET scan on Wednesday at 1 PM. The patient and wife have been informed of these appointment dates.  2. Discussion of findings with medical team and coordination of future medical and dental care as needed.  I spent 75 minutes face to face with patient and more than 50% of time was spent in counseling and /or coordination of care.   Lenn Cal, DDS

## 2013-09-24 NOTE — Progress Notes (Signed)
[facility] NEW PATIENT EVALUATION  Name: [name] Wu MRN: [id number]  Date:   09/24/2013           DOB: 1955/11/11  Status: outpatient   CC: [place], Luane School, NP  Izora Gala, MD , Dr. Heath Lark, Dr. 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 male who is seen today through the courtesy of Dr. Constance Holster for consideration of radiation therapy in the management of his stage IV (T3 N2b MX) squamous cell carcinoma the right tonsil/base of tongue. He gives an 8 month history of intermittent sore throats and otalgia. After failing to respond to corticosteroids and antibiotics he was referred to Dr. Constance Holster for a further evaluation. When he was seen on 09/04/2013 he had flexible laryngoscopy, and there was fullness noted along the base of tongue but there is no significant asymmetry. A CT of his neck on 09/06/2013 showed a 2.0 x 3.1 x 4.2 cm right base of tongue enhancing mass lesion concerning for carcinoma. Right level II and level III adenopathy was seen with a level II node measuring 2.0 x 1.5 x 1.2 cm and a right level III node measuring 3.0 x 1.6 x 2.3 cm. There also appeared to be a 2.1 x 1.6 cm right peritracheal lymph node at the thoracic inlet. On 09/12/2013 he underwent direct laryngoscopy/endoscopy with biopsy. Along the right oropharynx was a firm friable mass involving the lateral base of the tongue and inferior tonsillar fossa. Biopsy was diagnostic for invasive squamous cell carcinoma which was P16 positive. He is been taking hydrocodone/APAP, ibuprofen, and tramadol when necessary pain. He is not had any weight loss of the past 3 months. His appetite is fair to good. He sees Dr. Enrique Sack of dental oncology later today. His appointment with Dr. Alvy Bimler is pending, and his staging PET scan is  also pending.  PREVIOUS RADIATION THERAPY: No   PAST MEDICAL HISTORY:  has a past medical history of Sleep apnea; Tuberculosis (1984); Amnesia; and Malignant neoplasm of base of tongue (09/13/13).     PAST SURGICAL HISTORY:  Past Surgical History  Procedure Laterality Date  . Appendectomy  1956    at [facility]  . Finger surgery Right     knuckle removed  . Foot surgery Left     x2  . Tumor removal      left chest-bengin  . Vasectomy    . Direct laryngoscopy N/A 09/12/2013    Procedure: DIRECT LARYNGOSCOPY WITH BIOPSY ;  Surgeon: Izora Gala, MD;  Location: [place];  Service: ENT;  Laterality: N/A;  . Esophagoscopy N/A 09/12/2013    Procedure: ESOPHAGOSCOPY;  Surgeon: Izora Gala, MD;  Location: [place];  Service: ENT;  Laterality: N/A;     FAMILY HISTORY: family history includes Cancer in his paternal grandfather. Is peripheral grandfather had some type of malignancy. His mother is alive and well at 92 and his father is alive and well at 58. Strong family history of heart disease.   SOCIAL HISTORY:  reports that he has never smoked. He has never used smokeless tobacco. He reports that he does not drink alcohol or use illicit drugs. Remarried 2 years ago, 2 children from his first marriage. He works as a Information systems manager.   ALLERGIES: Codeine and Velosef  MEDICATIONS:  Current Outpatient Prescriptions  Medication Sig Dispense Refill  . acetaminophen (TYLENOL) 325 MG tablet Take 650 mg by mouth every 6 (six) hours as needed for mild pain, moderate pain or fever.      Marland Kitchen HYDROcodone-acetaminophen (NORCO) 7.5-325 MG per tablet Take 1 tablet by mouth every 6 (six) hours as needed for moderate pain.  30 tablet  0  . ibuprofen (ADVIL,MOTRIN) 200 MG tablet Take 600 mg by mouth every 6 (six) hours as needed for fever, headache or mild pain.      . promethazine (PHENERGAN) 25 MG suppository Place 1 suppository (25 mg total) rectally every 6 (six) hours as needed for nausea or vomiting.  12  suppository  1  . traMADol (ULTRAM) 50 MG tablet Take by mouth every 6 (six) hours as needed.       No current facility-administered medications for this encounter.     REVIEW OF SYSTEMS:  Pertinent items are noted in HPI.    PHYSICAL EXAM:  height is 5\' 5"  (1.651 m) and weight is 214 lb 9.6 oz (97.342 kg). His oral temperature is 98.4 F (36.9 C). His blood pressure is 120/87 and his pulse is 71. His respiration is 20.   Alert and oriented 58 year old white male appearing his stated age. Head and neck examination: On inspection oral cavity, there are numerous restorations and missing teeth. There is slightly whitish discoloration of his oral tongue. He has a brisk gag reflex but I am able to see erythema and ulcerations along the right inferior tonsil. Indirect mirror examination is difficult secondary to a brisk gag reflex and I do not fully visualize the base of tongue. Cranial nerves intact. Nodes: he has a broad neck. There are is a 2.5 cm right level II node, but I can feel a 2 cm right level III node. No adenopathy on the left.   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 Even though the epicenter of his disease appears to be along the lateral right base of tongue, it is traditional to label his diagnosis as a tonsil primary with secondary involvement of his base of tongue. I explained to the patient and his wife that we need to proceed with a PET scan as soon as possible for further staging and planning purposes. He'll see Dr. Enrique Sack today, her for Dr. Alvy Bimler later this week. We'll have him see  nutrition, speech therapy, and also physical therapy. I see that Dr. Alvy Bimler will schedule a Port-A-Cath and PEG tube. We discussed the role of chemoradiation in this setting. We discussed the potential acute and late toxicities of radiation therapy. My initial concern is that he has mediastinal adenopathy, which can certainly be treated, but may indicate a higher risk for hematogenous spread. I just spoke with Dr. Enrique Sack who recommends a left mandibular molar extraction. Once we know the date of his dental surgery, we can schedule him for CT simulation. Again, we need a PET scan as soon as possible.  PLAN: As discussed above.  I spent 60 minutes minutes face to face with the patient and more than 50% of that time was spent in counseling and/or coordination of care.

## 2013-09-24 NOTE — Progress Notes (Signed)
Patient's wife brought a 'Critical Illness Claim Form' to me late this afternoon for Dr. Charlton Amor completion.  She also brought a copy of his HCPOA and Living Will for his records.  I will forward these items to [facility] tomorrow morning.  Gayleen Orem, RN, BSN, [place] at [place] [phone]

## 2013-09-24 NOTE — Progress Notes (Signed)
Met with patient and his wife during initial consult with Dr. Valere Dross.  Further explained my role as their Navigator, provided contact information, encouraged them to contact me with questions/concerns as treatments/procedures begin.  Provided introductory explanation of radiation treatment including SIM planning and fitting of head mask, showed them example of mask.  They indicated understanding.    Provided them a tour of SIM and Tomo areas, explained treatments and arrival procedures.  They verbalized understanding.  Escorted them to [facility], showed them location of Radiology for upcoming PET, explained arrival procedure;  escorted them to Dr. Ritta Slot office for scheduled appt.   Gayleen Orem, RN, BSN, [place] at [place] [phone]

## 2013-09-25 ENCOUNTER — Ambulatory Visit (HOSPITAL_COMMUNITY)
Admission: RE | Admit: 2013-09-25 | Discharge: 2013-09-25 | Disposition: A | Discharge status: Home / Self Care | Payer: BC Managed Care – PPO | Source: Ambulatory Visit | Attending: Radiation Oncology | Admitting: Radiation Oncology

## 2013-09-25 DIAGNOSIS — C099 Malignant neoplasm of tonsil, unspecified: Secondary | ICD-10-CM | POA: Diagnosis not present

## 2013-09-25 DIAGNOSIS — R599 Enlarged lymph nodes, unspecified: Secondary | ICD-10-CM | POA: Insufficient documentation

## 2013-09-25 LAB — GLUCOSE, CAPILLARY: Glucose-Capillary: 104 mg/dL — ABNORMAL HIGH (ref 70–99)

## 2013-09-25 MED ORDER — FLUDEOXYGLUCOSE F - 18 (FDG) INJECTION
12.2000 | Freq: Once | INTRAVENOUS | Status: AC | PRN
  Administered 2013-09-25: 12.2 via INTRAVENOUS

## 2013-09-26 ENCOUNTER — Telehealth (INDEPENDENT_AMBULATORY_CARE_PROVIDER_SITE_OTHER): Payer: Self-pay

## 2013-09-26 ENCOUNTER — Ambulatory Visit: Payer: BC Managed Care – PPO | Attending: Radiation Oncology | Admitting: Physical Therapy

## 2013-09-26 DIAGNOSIS — IMO0001 Reserved for inherently not codable concepts without codable children: Secondary | ICD-10-CM | POA: Insufficient documentation

## 2013-09-26 DIAGNOSIS — I890 Lymphedema, not elsewhere classified: Secondary | ICD-10-CM | POA: Insufficient documentation

## 2013-09-26 NOTE — Progress Notes (Signed)
To provide support, encouragement and care continuity, met with patient and his wife during appt with Dr. Alvy Bimler.  Assisted them through appt scheduling afterwards.  Gayleen Orem, RN, BSN, [place] at [place] [phone]

## 2013-09-27 NOTE — Telephone Encounter (Signed)
No notes in this encounter. 

## 2013-09-30 ENCOUNTER — Telehealth (HOSPITAL_COMMUNITY): Payer: Self-pay | Admitting: Dentistry

## 2013-09-30 ENCOUNTER — Other Ambulatory Visit: Payer: BC Managed Care – PPO

## 2013-09-30 ENCOUNTER — Encounter: Payer: Self-pay | Admitting: Radiation Oncology

## 2013-09-30 ENCOUNTER — Ambulatory Visit: Payer: BC Managed Care – PPO | Admitting: Nutrition

## 2013-09-30 ENCOUNTER — Encounter (INDEPENDENT_AMBULATORY_CARE_PROVIDER_SITE_OTHER): Payer: Self-pay | Admitting: General Surgery

## 2013-09-30 ENCOUNTER — Other Ambulatory Visit (HOSPITAL_BASED_OUTPATIENT_CLINIC_OR_DEPARTMENT_OTHER): Payer: BC Managed Care – PPO

## 2013-09-30 ENCOUNTER — Ambulatory Visit (INDEPENDENT_AMBULATORY_CARE_PROVIDER_SITE_OTHER): Payer: BC Managed Care – PPO | Admitting: General Surgery

## 2013-09-30 VITALS — BP 130/82 | HR 73 | Temp 98.7°F | Ht 65.0 in | Wt 216.0 lb

## 2013-09-30 DIAGNOSIS — C01 Malignant neoplasm of base of tongue: Secondary | ICD-10-CM

## 2013-09-30 LAB — CBC WITH DIFFERENTIAL/PLATELET
BASO%: 0.9 % (ref 0.0–2.0)
Basophils Absolute: 0.1 10*3/uL (ref 0.0–0.1)
EOS%: 5 % (ref 0.0–7.0)
Eosinophils Absolute: 0.4 10*3/uL (ref 0.0–0.5)
HCT: 45 % (ref 38.4–49.9)
HGB: 14.8 g/dL (ref 13.0–17.1)
LYMPH%: 24.2 % (ref 14.0–49.0)
MCH: 30.6 pg (ref 27.2–33.4)
MCHC: 32.9 g/dL (ref 32.0–36.0)
MCV: 93.1 fL (ref 79.3–98.0)
MONO#: 0.9 10*3/uL (ref 0.1–0.9)
MONO%: 11.3 % (ref 0.0–14.0)
NEUT#: 4.5 10*3/uL (ref 1.5–6.5)
NEUT%: 58.6 % (ref 39.0–75.0)
Platelets: 300 10*3/uL (ref 140–400)
RBC: 4.83 10*6/uL (ref 4.20–5.82)
RDW: 12.9 % (ref 11.0–14.6)
WBC: 7.6 10*3/uL (ref 4.0–10.3)
lymph#: 1.9 10*3/uL (ref 0.9–3.3)

## 2013-09-30 LAB — COMPREHENSIVE METABOLIC PANEL (CC13)
ALT: 15 U/L (ref 0–55)
AST: 15 U/L (ref 5–34)
Albumin: 3.5 g/dL (ref 3.5–5.0)
Alkaline Phosphatase: 66 U/L (ref 40–150)
Anion Gap: 7 mEq/L (ref 3–11)
BUN: 18.7 mg/dL (ref 7.0–26.0)
CO2: 27 mEq/L (ref 22–29)
Calcium: 9.2 mg/dL (ref 8.4–10.4)
Chloride: 108 mEq/L (ref 98–109)
Creatinine: 1.4 mg/dL — ABNORMAL HIGH (ref 0.7–1.3)
Glucose: 102 mg/dl (ref 70–140)
Potassium: 4.3 mEq/L (ref 3.5–5.1)
Sodium: 142 mEq/L (ref 136–145)
Total Bilirubin: 0.57 mg/dL (ref 0.20–1.20)
Total Protein: 6.7 g/dL (ref 6.4–8.3)

## 2013-09-30 NOTE — Assessment & Plan Note (Addendum)
Will plan port a cath and g tube at first available opportunity.    Reviewed port and g tube surgery, risks, benefits.   Discussed risks of bleeding, infection, damage to adjacent structures, malfunction, need for additional procedures, and other.  We will do this at the first available opportunity.  His mass is not visible anteriorly.  Anesthesia may need to evaluate his airway up front.

## 2013-09-30 NOTE — Progress Notes (Signed)
58 year old male diagnosed with tonsil cancer.  He is a patient of Dr. Alvy Bimler.  Past medical history includes sleep apnea, tuberculosis, and amnesia.  Medications include Phenergan.  Labs were reviewed.  Height: 65 inches. Weight: 214 pounds. Usual body weight: 224 pounds September 12, 2013. BMI: 35.61.  Denies nutrition issues.  He is eating a regular diet.  He has no difficulty swallowing or chewing at this time.  Nutrition diagnosis: Predicted suboptimal energy intake related to new diagnosis of tonsil cancer and associated treatments as evidenced by the history of this condition for which research shows suboptimal energy intake.  Intervention: Patient educated on adequate calories and protein in small, frequent meals and snacks.  Educated patient on high-protein foods. Educated patient on strategies for nausea and constipation.  Provided fact sheets for patient.   Encouraged patient to strive for weight maintenance. Provided oral nutrition supplement samples, and recipes. Questions answered.  Teach back method used.  Contact information was given.  Monitoring, evaluation,Goals: Patient will tolerate adequate calories and protein to promote weight maintenance.  Next visit: To be scheduled  **Disclaimer: This note was dictated with voice recognition software. Similar sounding words can inadvertently be transcribed and this note may contain transcription errors which may not have been corrected upon publication of note.**

## 2013-09-30 NOTE — Patient Instructions (Signed)
Implanted Port Insertion An implanted port is a central line that has a round shape and is placed under the skin. It is used as a long-term IV access for:   Medicines, such as chemotherapy.   Fluids.   Liquid nutrition, such as total parenteral nutrition (TPN).   Blood samples.  LET YOUR HEALTH CARE PROVIDER KNOW ABOUT:  Allergies to food or medicine.   Medicines taken, including vitamins, herbs, eye drops, creams, and over-the-counter medicines.   Any allergies to heparin.  Use of steroids (by mouth or creams).   Previous problems with anesthetics or numbing medicines.   History of bleeding problems or blood clots.   Previous surgery.   Other health problems, including diabetes and kidney problems.   Possibility of pregnancy, if this applies. RISKS AND COMPLICATIONS Generally, this is a safe procedure. However, as with any procedure, problems can occur. Possible problems include:  Damage to the blood vessel, bruising, or bleeding at the puncture site.   Infection.  Blood clot in the vessel that the port is in.  Breakdown of the skin over your port.  Very rarely a person may develop a condition called a pneumothorax, a collection of air in the chest that may cause one of the lungs to collapse. The placement of these catheters with the appropriate imaging guidance significantly decreases the risk of a pneumothorax.  BEFORE THE PROCEDURE   Your health care provider may want you to have blood tests. These tests can help tell how well your kidneys and liver are working. They can also show how well your blood clots.   If you take blood thinners (anticoagulant medicines), ask your health care provider when you should stop taking them.   Make arrangements for someone to drive you home. This is necessary if you have been sedated for your procedure.  PROCEDURE  Port insertion usually takes about 30-45 minutes.   An IV needle will be inserted in your arm.  Medicine for pain and medicine to help relax you (sedative) will flow directly into your body through this needle.   You will lie on an exam table, and you will be connected to monitors to keep track of your heart rate, blood pressure, and breathing throughout the procedure.  An oxygen monitoring device may be attached to your finger. Oxygen will be given.   Everything will be kept as germ free (sterile) as possible during the procedure. The skin near the point of the incision will be cleansed with antiseptic, and the area will be draped with sterile towels. The skin and deeper tissues over the port area will be made numb with a local anesthetic.  Two small cuts (incisions) will be made in the skin to insert the port. One will be made in the neck to obtain access to the vein where the catheter will lie.   Because the port reservoir will be placed under the skin, a small skin incision will be made in the upper chest, and a small pocket for the port will be made under the skin. The catheter that will be connected to the port tunnels to a large central vein in the chest. A small, raised area will remain on your body at the site of the reservoir when the procedure is complete.  The port placement will be done under imaging guidance to ensure the proper placement.  The reservoir has a silicone covering that can be punctured with a special needle.   The port will be flushed with normal   saline, and blood will be drawn to make sure it is working properly.  There will be nothing remaining outside the skin when the procedure is finished.   Incisions will be held together by stitches, surgical glue, or a special tape. AFTER THE PROCEDURE  You will stay in a recovery area until the anesthesia has worn off. Your blood pressure and pulse will be checked.  A final chest X-ray will be taken to check the placement of the port and to ensure that there is no injury to your lung.     Gastrostomy  Tube Home Guide A gastrostomy tube is a tube that is surgically placed into the stomach. It is also called a "G-tube." G-tubes are used when a person is unable to eat and drink enough on their own to stay healthy. The tube is inserted into the stomach through a small cut (incision) in the skin. This tube is used for:  Feeding.  Giving medication. GASTROSTOMY TUBE CARE  Wash your hands with soap and water.  Remove the old dressing (if any). Some styles of G-tubes may need a dressing inserted between the skin and the G-tube. Other types of G-tubes do not require a dressing. Ask your health care provider if a dressing is needed.  Check the area where the tube enters the skin (insertion site) for redness, swelling, or pus-like (purulent) drainage. A small amount of clear or tan liquid drainage is normal. Check to make sure scar tissue (skin) is not growing around the insertion site. This could have a raised, bumpy appearance.  A cotton swab can be used to clean the skin around the tube:  When the G-tube is first put in, a normal saline solution or water can be used to clean the skin.  Mild soap and warm water can be used when the skin around the G-tube site has healed.  Roll the cotton swab around the G-tube insertion site to remove any drainage or crusting at the insertion site. STOMACH RESIDUALS Feeding tube residuals are the amount of liquids that are in the stomach at any given time. Residuals may be checked before giving feedings, medications, or as instructed by your health care provider.  Ask your health care provider if there are instances when you would not start tube feedings depending on the amount or type of contents withdrawn from the stomach.  Check residuals by attaching a syringe to the G-tube and pulling back on the syringe plunger. Note the amount, and return the residual back into the stomach. FLUSHING THE G-TUBE  The G-tube should be periodically flushed with clean warm  water to keep it from clogging.  Flush the G-tube after feedings or medications. Draw up 30 mL of warm water in a syringe. Connect the syringe to the G-tube and slowly push the water into the tube.  Do not push feedings, medications, or flushes rapidly. Flush the G-tube gently and slowly.  Only use syringes made for G-tubes to flush medications or feedings.  Your health care provider may want the G-tube flushed more often or with more water. If this is the case, follow your health care provider's instructions. FEEDINGS Your health care provider will determine whether feedings are given as a bolus (a certain amount given at one time and at scheduled times) or whether feedings will be given continuously on a feeding pump.   Formulas should be given at room temperature.  If feedings are continuous, no more than 4 hours worth of feedings should be placed  in the feeding bag. This helps prevent spoilage or accidental excess infusion.  Cover and place unused formula in the refrigerator.  If feedings are continuous, stop the feedings when medications or flushes are given. Be sure to restart the feedings.  Feeding bags and syringes should be replaced as instructed by your health care provider. GIVING MEDICATION   In general, it is best if all medications are in a liquid form for G-tube administration. Liquid medications are less likely to clog the G-tube.  Mix the liquid medication with 30 mL (or amount recommended by your health care provider) of warm water.  Draw up the medication into the syringe.  Attach the syringe to the G-tube and slowly push the mixture into the G-tube.  After giving the medication, draw up 30 mL of warm water in the syringe and slowly flush the G-tube.  For pills or capsules, check with your health care provider first before crushing medications. Some pills are not effective if they are crushed. Some capsules are sustained-release medications.  If appropriate,  crush the pill or capsule and mix with 30 mL of warm water. Using the syringe, slowly push the medication through the tube, then flush the tube with another 30 mL of tap water. G-TUBE PROBLEMS G-tube was pulled out.  Cause: May have been pulled out accidentally.  Solutions: Cover the opening with clean dressing and tape. Call your health care provider right away. The G-tube should be put in as soon as possible (within 4 hours) so the G-tube opening (tract) does not close. The G-tube needs to be put in at a health care setting. An X-ray needs to be done to confirm placement before the G-tube can be used again. Redness, irritation, soreness, or foul odor around the gastrostomy site.  Cause: May be caused by leakage or infection.  Solutions: Call your health care provider right away. Large amount of leakage of fluid or mucus-like liquid present (a large amount means it soaks clothing).  Cause: Many reasons could cause the G-tube to leak.  Solutions: Call your health care provider to discuss the amount of leakage. Skin or scar tissue appears to be growing where tube enters skin.   Cause: Tissue growth may develop around the insertion site if the G-tube is moved or pulled on excessively.  Solutions: Secure tube with tape so that excess movement does not occur. Call your health care provider. G-tube is clogged.  Cause: Thick formula or medication.  Solutions: Try to slowly push warm water into the tube with a large syringe. Never try to push any object into the tube to unclog it. Do not force fluid into the G-tube. If you are unable to unclog the tube, call your health care provider right away. TIPS  Head of bed (HOB) position refers to the upright position of a person's upper body.  When giving medications or a feeding bolus, keep the [facility] up as told by your health care provider. Do this during the feeding and for 1 hour after the feeding or medication administration.  If continuous  feedings are being given, it is best to keep the [facility] up as told by your health care provider. When ADLs (activities of daily living) are performed and the [facility] needs to be flat, be sure to turn the feeding pump off. Restart the feeding pump when the [facility] is returned to the recommended height.  Do not pull or put tension on the tube.  To prevent fluid backflow, kink the G-tube before removing  the cap or disconnecting a syringe.  Check the G-tube length every day. Measure from the insertion site to the end of the G-tube. If the length is longer than previous measurements, the tube may be coming out. Call your health care provider if you notice increasing G-tube length.  Oral care, such as brushing teeth, must be continued.  You may need to remove excess air (vent) from the G-tube. Your health care provider will tell you if this is needed.  Always call your health care provider if you have questions or problems with the G-tube. SEEK IMMEDIATE MEDICAL CARE IF:   You have severe abdominal pain, tenderness, or abdominal bloating (distension).  You have nausea or vomiting.  You are constipated or have problems moving your bowels.  The G-tube insertion site is red, swollen, has a foul smell, or has yellow or brown drainage.  You have difficulty breathing or shortness of breath.  You have a fever.  You have a large amount of feeding tube residuals.  The G-tube is clogged and cannot be flushed. MAKE SURE YOU:   Understand these instructions.  Will watch your condition.  Will get help right away if you are not doing well or get worse. Marland Kitchen

## 2013-09-30 NOTE — Progress Notes (Signed)
CC: Dr. Jorja Loa, Dr. Heath Lark   Dr. Enrique Sack informed me that the patient's surgery will be on August 27. Will have him return for CT simulation in the afternoon of September 1 or September 2. I anticipate beginning his radiation therapy along with chemotherapy on September 14.

## 2013-09-30 NOTE — Telephone Encounter (Signed)
09/30/2013  Patient:            [name] Wu Date of Birth:  10-12-1955 MRN:                [id number]  I called the patient's mobile phone and discussed plan of care with the patient's wife, Marcellas Marchant. She indicated that the patient was only having extraction of tooth numbers 18, 30, 31 and not having the bilateral mandibular lingual tori reductions. I informed her that the tori reductions should be performed (in my opinion) if he is ever contemplating fabrication of a lower partial denture. The wife indicated that she will discuss this with her husband further and contact dental medicine with her husband's decision on whether to have the bilateral mandibular lingual tori reductions or not. Currently the patient is scheduled for oral surgery on Thursday, August 27 at 8:30 AM with Dr. Lewanda Rife. Lenn Cal, DDS

## 2013-09-30 NOTE — Progress Notes (Signed)
Chief Complaint  Patient presents with  . Eval for [facility]    Referring MD: Gorsuch  HISTORY:  The patient is a 58 year old male who is referred by Dr. 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 He has need for adjuvant therapy and feeding tube placement.  He has not had any recent weight loss. He is not ever had difficulty with his airway. Has had prior abdominal surgery. He had a ruptured appendix and required 2 surgeries for this.  He does abdominal pain, nausea, or vomiting. He is currently having a small amount of difficulty eating.   Past Medical History  Diagnosis Date  . Sleep apnea     uses CPAP  . Tuberculosis 1984    INH x 5yrs  . Amnesia     after being hitting head playing baseball, lasted 1 night  . Malignant neoplasm of base of tongue 09/13/13    right base of tongue- inv squamous cell     Past Surgical History  Procedure Laterality Date  . Appendectomy  1956    at [facility]  . Finger surgery Right     knuckle removed  . Foot surgery Left     x2  . Tumor removal      left chest-bengin  . Vasectomy    . Direct laryngoscopy N/A 09/12/2013    Procedure: DIRECT LARYNGOSCOPY WITH BIOPSY ;  Surgeon: Izora Gala, MD;  Location: [facility];  Service: ENT;  Laterality: N/A;  . Esophagoscopy N/A 09/12/2013    Procedure: ESOPHAGOSCOPY;  Surgeon: Izora Gala, MD;  Location: [facility] OR;  Service: ENT;  Laterality: N/A;    Current Outpatient Prescriptions  Medication Sig Dispense Refill  . HYDROcodone-acetaminophen (NORCO) 7.5-325 MG per tablet Take 1 tablet by mouth every 6 (six) hours as needed for moderate pain.  80 tablet  0  . ibuprofen (ADVIL,MOTRIN) 200 MG tablet Take 600 mg by mouth every 6 (six) hours as needed for fever, headache or mild pain.      . traMADol (ULTRAM) 50 MG tablet Take 1 tablet (50 mg total) by mouth every 6 (six) hours as needed for moderate pain.  60 tablet  0  . promethazine (PHENERGAN) 25 MG suppository Place 1 suppository (25 mg  total) rectally every 6 (six) hours as needed for nausea or vomiting.  12 suppository  1   No current facility-administered medications for this visit.     Allergies  Allergen Reactions  . Codeine Nausea And Vomiting  . Velosef [Cephradine] Rash     Family History  Problem Relation Age of Onset  . Cancer Paternal Grandfather      History   Social History  . Marital Status: Married    Spouse Name: N/A    Number of Children: 61  . Years of Education: N/A   Occupational History  .  [employer]   Social History Main Topics  . Smoking status: Never Smoker   . Smokeless tobacco: Never Used  . Alcohol Use: No  . Drug Use: No  . Sexual Activity: None   Other Topics Concern  . None   Social History Narrative   09/24/2013   Patient is married.   Patient has 2 children from his previous marriage and 3 stepchildren from this married. Patient has been married to his current wife for approximately 2 years.   Patient has never smoked, never used smokeless tobacco, and indicates that he does not use illicit drugs or alcohol.  REVIEW OF SYSTEMS - PERTINENT POSITIVES ONLY: 12 point review of systems negative other than HPI and PMH except for sore throat, trouble swallowing.  EXAM: Filed Vitals:   09/30/13 1331  BP: 130/82  Pulse: 73  Temp: 98.7 F (37.1 C)    Wt Readings from Last 3 Encounters:  09/30/13 216 lb (97.977 kg)  09/24/13 214 lb (97.07 kg)  09/12/13 224 lb 11.2 oz (101.923 kg)     Gen:  No acute distress.  Well nourished and well groomed.   Neurological: Alert and oriented to person, place, and time. Coordination normal.  Head: Normocephalic and atraumatic.  Eyes: Conjunctivae are normal. Pupils are equal, round, and reactive to light. No scleral icterus.  Neck: Normal range of motion. Neck supple. No tracheal deviation or thyromegaly present.  Cardiovascular: Normal rate, regular rhythm, normal heart sounds and intact distal pulses.   Exam reveals no gallop and no friction rub.  No murmur heard. Respiratory: Effort normal.  No respiratory distress. No chest wall tenderness. Breath sounds normal.  No wheezes, rales or rhonchi.  GI: Soft. Bowel sounds are normal. The abdomen is soft and nontender.  There is no rebound and no guarding. prior RLQ scar and right paramedian infraumbilical scar. Musculoskeletal: Normal range of motion. Extremities are nontender.  Lymphadenopathy: No cervical, preauricular, postauricular or axillary adenopathy is present Skin: Skin is warm and dry. No rash noted. No diaphoresis. No erythema. No pallor. No clubbing, cyanosis, or edema.   Psychiatric: Normal mood and affect. Behavior is normal. Judgment and thought content normal.    LABORATORY RESULTS: Available labs are reviewed   Recent Results (from the past 2160 hour(s))  BASIC METABOLIC PANEL     Status: Abnormal   Collection Time    09/09/13  1:39 PM      Result Value Ref Range   Sodium 142  137 - 147 mEq/L   Potassium 4.9  3.7 - 5.3 mEq/L   Chloride 106  96 - 112 mEq/L   CO2 26  19 - 32 mEq/L   Glucose, Bld 91  70 - 99 mg/dL   BUN 21  6 - 23 mg/dL   Creatinine, Ser 1.41 (*) 0.50 - 1.35 mg/dL   Calcium 9.4  8.4 - 10.5 mg/dL   GFR calc non Af Amer 53 (*) >90 mL/min   GFR calc Af Amer 62 (*) >90 mL/min   Comment: (NOTE)     The eGFR has been calculated using the CKD EPI equation.     This calculation has not been validated in all clinical situations.     eGFR's persistently <90 mL/min signify possible Chronic Kidney     Disease.   Anion gap 10  5 - 15  CBC     Status: None   Collection Time    09/09/13  1:39 PM      Result Value Ref Range   WBC 8.9  4.0 - 10.5 K/uL   RBC 5.18  4.22 - 5.81 MIL/uL   Hemoglobin 16.3  13.0 - 17.0 g/dL   HCT 47.9  39.0 - 52.0 %   MCV 92.5  78.0 - 100.0 fL   MCH 31.5  26.0 - 34.0 pg   MCHC 34.0  30.0 - 36.0 g/dL   RDW 13.1  11.5 - 15.5 %   Platelets 269  150 - 400 K/uL  GLUCOSE, CAPILLARY      Status: Abnormal   Collection Time    09/25/13 12:41 PM  Result Value Ref Range   Glucose-Capillary 104 (*) 70 - 99 mg/dL  CBC WITH DIFFERENTIAL     Status: None   Collection Time    09/30/13  9:52 AM      Result Value Ref Range   WBC 7.6  4.0 - 10.3 10e3/uL   NEUT# 4.5  1.5 - 6.5 10e3/uL   HGB 14.8  13.0 - 17.1 g/dL   HCT 45.0  38.4 - 49.9 %   Platelets 300  140 - 400 10e3/uL   MCV 93.1  79.3 - 98.0 fL   MCH 30.6  27.2 - 33.4 pg   MCHC 32.9  32.0 - 36.0 g/dL   RBC 4.83  4.20 - 5.82 10e6/uL   RDW 12.9  11.0 - 14.6 %   lymph# 1.9  0.9 - 3.3 10e3/uL   MONO# 0.9  0.1 - 0.9 10e3/uL   Eosinophils Absolute 0.4  0.0 - 0.5 10e3/uL   Basophils Absolute 0.1  0.0 - 0.1 10e3/uL   NEUT% 58.6  39.0 - 75.0 %   LYMPH% 24.2  14.0 - 49.0 %   MONO% 11.3  0.0 - 14.0 %   EOS% 5.0  0.0 - 7.0 %   BASO% 0.9  0.0 - 2.0 %  COMPREHENSIVE METABOLIC PANEL (WG95)     Status: Abnormal   Collection Time    09/30/13  9:52 AM      Result Value Ref Range   Sodium 142  136 - 145 mEq/L   Potassium 4.3  3.5 - 5.1 mEq/L   Chloride 108  98 - 109 mEq/L   CO2 27  22 - 29 mEq/L   Glucose 102  70 - 140 mg/dl   BUN 18.7  7.0 - 26.0 mg/dL   Creatinine 1.4 (*) 0.7 - 1.3 mg/dL   Total Bilirubin 0.57  0.20 - 1.20 mg/dL   Alkaline Phosphatase 66  40 - 150 U/L   AST 15  5 - 34 U/L   ALT 15  0 - 55 U/L   Total Protein 6.7  6.4 - 8.3 g/dL   Albumin 3.5  3.5 - 5.0 g/dL   Calcium 9.2  8.4 - 10.4 mg/dL   Anion Gap 7  3 - 11 mEq/L     RADIOLOGY RESULTS: See E-Chart or I-Site for most recent results.  Images and reports are reviewed.  Dg Chest 2 View  09/09/2013   CLINICAL DATA:  Sleep apnea.  History of tuberculosis.  EXAM: CHEST  2 VIEW  COMPARISON:  Abdominal CT 02/03/2010.  FINDINGS: The heart size and mediastinal contours are normal. The lungs are clear. There is no pleural effusion or pneumothorax. No acute osseous findings are identified.  IMPRESSION: No active cardiopulmonary process.   Electronically Signed    By: Camie Patience M.D.   On: 09/09/2013 14:14   Ct Soft Tissue Neck W Contrast  09/06/2013   CLINICAL DATA:  Cervical adenopathy. Right-sided sore throat. A preop exam for biopsy.  EXAM: CT NECK WITH CONTRAST  TECHNIQUE: Multidetector CT imaging of the neck was performed using the standard protocol following the bolus administration of intravenous contrast.  CONTRAST:  55mL OMNIPAQUE IOHEXOL 300 MG/ML  SOLN  COMPARISON:  Cervical spine CT 07/07/2006  FINDINGS: A heterogeneously enhancing right level 2 lymph node measures 2.0 x 1.5 x 1.2 cm. A right level 3 node or nodal mass measures 3.0 x 1.6 x 2.3 cm. This may represent up to 3 separate adjacent nodes. No other pathologically enlarged  nodes are present in the neck. There is no significant left-sided adenopathy.  A base of tongue lesion is present on the right, measuring 2.0 x 3.1 x 4.2 cm. No other significant mucosal submucosal lesions are present.  The larynx is within normal limits. The vocal cords are midline and symmetric.  The thyroid is normal. The right peritracheal lymph node at the thoracic inlet measures 2.1 x 1.6 cm. No other significant mediastinal lymph nodes are present.  Multilevel degenerative changes are present within the cervical spine with endplate sclerotic changes and uncovertebral spurring at C3-4 through C6-7. Osseous foraminal narrowing is present on the left at C3-4 and C6-7 and on the right at C4-5. Advanced degenerative change first is a remote tip of the dens fracture is noted. The lung apices are clear.  IMPRESSION: 1. 2.0 x 3.1 x 4.2 cm right base of tongue enhancing mass lesion is most concerning for a squamous cell cancer. 2. Right level 2 and level 3 adenopathy as described. 3. Right peritracheal lymph node within the superior mediastinum. 4. Moderate spondylosis of the cervical spine.   Electronically Signed   By: Lawrence Santiago M.D.   On: 09/06/2013 13:44   Nm Pet Image Initial (pi) Skull Base To Thigh  09/25/2013    CLINICAL DATA:  Initial treatment strategy for base of tongue mass.  EXAM: NUCLEAR MEDICINE PET SKULL BASE TO THIGH  TECHNIQUE: 12.2 mCi F-18 FDG was injected intravenously. Full-ring PET imaging was performed from the skull base to thigh after the radiotracer. CT data was obtained and used for attenuation correction and anatomic localization.  FASTING BLOOD GLUCOSE:  Value: 104 mg/dl  COMPARISON:  None.  FINDINGS: NECK  Increased radiotracer uptake associated with the right base of tongue mass has an SUV max equal to 14.8. There are several enlarged and hypermetabolic right sided level-II lymph nodes. Index lymph node measures 1.8 cm and has an SUV max equal to 11.1. No contralateral hypermetabolic adenopathy.  CHEST  There is a 1.2 cm right paratracheal lymph node, image 57/series 4. This has an SUV max equal to 1.9. No suspicious pulmonary nodules on the CT scan.  ABDOMEN/PELVIS  No abnormal hypermetabolic activity within the liver, pancreas, adrenal glands, or spleen. No hypermetabolic lymph nodes in the abdomen or pelvis.  SKELETON  No focal hypermetabolic activity to suggest skeletal metastasis.  IMPRESSION: 1. Right base of tongue mass exhibits intense FDG uptake compatible with primary head neck carcinoma. 2. Hypermetabolic right level to adenopathy. 3. Right paratracheal lymph node is abnormal by size criteria but does not exhibit significant malignant range FDG uptake. This is equivocal for lymph node metastasis.   Electronically Signed   By: Kerby Moors M.D.   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    Reviewed port and g tube surgery, risks, benefits.   Discussed risks of bleeding, infection, damage to adjacent structures, malfunction, need for additional procedures, and other.  We will do this at the first available opportunity.  His mass is not visible anteriorly.   Anesthesia may need to evaluate his airway up front.       Milus Height MD [facility]      Visit Diagnoses: 1. 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

## 2013-10-02 ENCOUNTER — Telehealth (HOSPITAL_COMMUNITY): Payer: Self-pay

## 2013-10-02 NOTE — Telephone Encounter (Signed)
10/02/2013    Called patient to discuss plan of treatment for removal of tori. Patient's wife , May Ozment confirmed that patient will not have tori removed.  LRI

## 2013-10-07 ENCOUNTER — Ambulatory Visit: Payer: BC Managed Care – PPO

## 2013-10-07 DIAGNOSIS — IMO0001 Reserved for inherently not codable concepts without codable children: Secondary | ICD-10-CM | POA: Diagnosis not present

## 2013-10-08 ENCOUNTER — Encounter: Payer: Self-pay | Admitting: *Deleted

## 2013-10-08 ENCOUNTER — Telehealth: Payer: Self-pay | Admitting: Hematology and Oncology

## 2013-10-08 ENCOUNTER — Other Ambulatory Visit (HOSPITAL_COMMUNITY): Payer: Self-pay | Admitting: Dentistry

## 2013-10-08 ENCOUNTER — Encounter: Payer: Self-pay | Admitting: Hematology and Oncology

## 2013-10-08 ENCOUNTER — Encounter (HOSPITAL_COMMUNITY): Payer: Self-pay | Admitting: Dentistry

## 2013-10-08 ENCOUNTER — Ambulatory Visit (HOSPITAL_COMMUNITY): Payer: Medicaid - Dental | Admitting: Dentistry

## 2013-10-08 ENCOUNTER — Ambulatory Visit
Admission: RE | Admit: 2013-10-08 | Discharge: 2013-10-08 | Disposition: A | Discharge status: Home / Self Care | Payer: BC Managed Care – PPO | Source: Ambulatory Visit | Attending: Radiation Oncology | Admitting: Radiation Oncology

## 2013-10-08 ENCOUNTER — Ambulatory Visit (HOSPITAL_BASED_OUTPATIENT_CLINIC_OR_DEPARTMENT_OTHER): Payer: BC Managed Care – PPO | Admitting: Hematology and Oncology

## 2013-10-08 VITALS — BP 121/71 | HR 67 | Temp 98.4°F

## 2013-10-08 VITALS — BP 118/83 | HR 66 | Temp 98.5°F | Resp 19 | Ht 65.0 in | Wt 215.5 lb

## 2013-10-08 DIAGNOSIS — K08409 Partial loss of teeth, unspecified cause, unspecified class: Secondary | ICD-10-CM

## 2013-10-08 DIAGNOSIS — C01 Malignant neoplasm of base of tongue: Secondary | ICD-10-CM

## 2013-10-08 DIAGNOSIS — M270 Developmental disorders of jaws: Secondary | ICD-10-CM

## 2013-10-08 DIAGNOSIS — Z510 Encounter for antineoplastic radiation therapy: Secondary | ICD-10-CM | POA: Diagnosis not present

## 2013-10-08 DIAGNOSIS — R7989 Other specified abnormal findings of blood chemistry: Secondary | ICD-10-CM

## 2013-10-08 DIAGNOSIS — R070 Pain in throat: Secondary | ICD-10-CM

## 2013-10-08 DIAGNOSIS — Z09 Encounter for follow-up examination after completed treatment for conditions other than malignant neoplasm: Secondary | ICD-10-CM

## 2013-10-08 DIAGNOSIS — R799 Abnormal finding of blood chemistry, unspecified: Secondary | ICD-10-CM

## 2013-10-08 DIAGNOSIS — Z0189 Encounter for other specified special examinations: Secondary | ICD-10-CM

## 2013-10-08 MED ORDER — HYDROCODONE-ACETAMINOPHEN 7.5-325 MG PO TABS: 1.0000 | ORAL_TABLET | Freq: Four times a day (QID) | ORAL | Status: DC | PRN

## 2013-10-08 MED ORDER — LIDOCAINE-PRILOCAINE 2.5-2.5 % EX CREA: 1.0000 "application " | TOPICAL_CREAM | CUTANEOUS | Status: DC | PRN

## 2013-10-08 MED ORDER — TRAMADOL HCL 50 MG PO TABS: 50.0000 mg | ORAL_TABLET | Freq: Four times a day (QID) | ORAL | Status: DC | PRN

## 2013-10-08 MED ORDER — ONDANSETRON HCL 8 MG PO TABS: 8.0000 mg | ORAL_TABLET | Freq: Three times a day (TID) | ORAL | Status: DC | PRN

## 2013-10-08 MED ORDER — PROCHLORPERAZINE MALEATE 10 MG PO TABS: 10.0000 mg | ORAL_TABLET | Freq: Four times a day (QID) | ORAL | Status: DC | PRN

## 2013-10-08 MED ORDER — SODIUM FLUORIDE 1.1 % DT GEL: DENTAL | Status: DC

## 2013-10-08 NOTE — Assessment & Plan Note (Signed)
I discussed pain management with the patient. He is comfortable with the current pain medication regimen and declined higher dose of narcotics prescription. I refilled both tramadol and Percocet for him.

## 2013-10-08 NOTE — Progress Notes (Signed)
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 There are right level 2 and level 3 adenopathy as described and right peritracheal lymph node within the superior mediastinum   09/12/2013 Surgery Laryngoscopy showed the oropharynx revealed a firm friable mass involving the lateral base of tongue and inferior tonsillar fossa.    09/13/2013 Pathology Results Accession: [id number] biopsy confirmed squamous cell carcinoma, HPV positive   10/03/2013 Procedure He had dental extraction    INTERVAL HISTORY: Please see below for problem oriented charting. He returns today to followup on test results. His pain is under control and he has not lost any weight.  REVIEW OF SYSTEMS:   Constitutional: Denies fevers, chills or abnormal weight loss Eyes: Denies blurriness of vision Respiratory: Denies cough, dyspnea or wheezes Cardiovascular: Denies palpitation, chest discomfort or lower extremity swelling Gastrointestinal:  Denies nausea, heartburn or change in bowel habits Skin: Denies abnormal skin rashes Lymphatics: Denies new lymphadenopathy or easy bruising Neurological:Denies numbness, tingling or new weaknesses Behavioral/Psych: Mood is stable, no new changes  All other systems were reviewed with the patient and are negative.  I have reviewed the past medical history, past surgical history, social history and family history with the patient and they are unchanged from previous note.  ALLERGIES:  is allergic to codeine and  velosef.  MEDICATIONS:  Current Outpatient Prescriptions  Medication Sig Dispense Refill  . HYDROcodone-acetaminophen (NORCO) 7.5-325 MG per tablet Take 1 tablet by mouth every 6 (six) hours as needed for moderate pain.  80 tablet  0  . ibuprofen (ADVIL,MOTRIN) 200 MG tablet Take 600 mg by mouth every 6 (six) hours as needed for fever, headache or mild pain.      . promethazine (PHENERGAN) 25 MG suppository Place 1 suppository (25 mg total) rectally every 6 (six) hours as needed for nausea or vomiting.  12 suppository  1  . sodium fluoride (FLUORISHIELD) 1.1 % GEL dental gel Instill one drop per toothspace in fluoride trays. Place over teeth for 5 minutes. Remove. Spit out excess. Do not swallow. Repeat nightly.  120 mL  prn  . traMADol (ULTRAM) 50 MG tablet Take 1 tablet (50 mg total) by mouth every 6 (six) hours as needed for moderate pain.  60 tablet  0  . lidocaine-prilocaine (EMLA) cream Apply 1 application topically as needed.  30 g  6   No current facility-administered medications for this visit.    PHYSICAL EXAMINATION: ECOG PERFORMANCE STATUS: 1 - Symptomatic but completely ambulatory  Filed Vitals:   10/08/13 1319  BP: 118/83  Pulse: 66  Temp: 98.5 F (36.9 C)  Resp: 19   Filed Weights   10/08/13 1319  Weight: 215 lb 8 oz (97.75 kg)    GENERAL:alert, no distress and comfortable SKIN: skin color, texture, turgor are normal, no rashes or significant lesions EYES: normal, Conjunctiva are pink and non-injected, sclera clear Musculoskeletal:no cyanosis of digits and no clubbing  NEURO: alert & oriented x 3 with fluent speech, no focal motor/sensory deficits  LABORATORY DATA:  I have reviewed the data as  listed    Component Value Date/Time   NA 142 09/30/2013 0952   NA 142 09/09/2013 1339   K 4.3 09/30/2013 0952   K 4.9 09/09/2013 1339   CL 106 09/09/2013 1339   CO2 27 09/30/2013 0952   CO2 26 09/09/2013 1339   GLUCOSE 102 09/30/2013 0952   GLUCOSE 91 09/09/2013 1339   BUN  18.7 09/30/2013 0952   BUN 21 09/09/2013 1339   CREATININE 1.4* 09/30/2013 0952   CREATININE 1.41* 09/09/2013 1339   CALCIUM 9.2 09/30/2013 0952   CALCIUM 9.4 09/09/2013 1339   PROT 6.7 09/30/2013 0952   ALBUMIN 3.5 09/30/2013 0952   AST 15 09/30/2013 0952   ALT 15 09/30/2013 0952   ALKPHOS 66 09/30/2013 0952   BILITOT 0.57 09/30/2013 0952   GFRNONAA 53* 09/09/2013 1339   GFRAA 62* 09/09/2013 1339    No results found for this basename: SPEP, UPEP,  kappa and lambda light chains    Lab Results  Component Value Date   WBC 7.6 09/30/2013   NEUTROABS 4.5 09/30/2013   HGB 14.8 09/30/2013   HCT 45.0 09/30/2013   MCV 93.1 09/30/2013   PLT 300 09/30/2013      Chemistry      Component Value Date/Time   NA 142 09/30/2013 0952   NA 142 09/09/2013 1339   K 4.3 09/30/2013 0952   K 4.9 09/09/2013 1339   CL 106 09/09/2013 1339   CO2 27 09/30/2013 0952   CO2 26 09/09/2013 1339   BUN 18.7 09/30/2013 0952   BUN 21 09/09/2013 1339   CREATININE 1.4* 09/30/2013 0952   CREATININE 1.41* 09/09/2013 1339      Component Value Date/Time   CALCIUM 9.2 09/30/2013 0952   CALCIUM 9.4 09/09/2013 1339   ALKPHOS 66 09/30/2013 0952   AST 15 09/30/2013 0952   ALT 15 09/30/2013 0952   BILITOT 0.57 09/30/2013 0952      ASSESSMENT & PLAN:  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 I plan to start him on 10/21/2013. We discussed the role of chemotherapy. The intent is for cure.  We discussed some of the risks, benefits, side-effects of cisplatin. Some of the short term side-effects included, though not limited to, including weight loss, life threatening infections, risk of allergic reactions, need for transfusions of blood products, nausea, vomiting, change in bowel habits, loss of hair, admission to hospital for various reasons, and risks of death.   Long term side-effects are also discussed including risks of infertility, permanent damage to nerve  function, hearing loss, chronic fatigue, kidney damage with possibility needing hemodialysis, and rare secondary malignancy including bone marrow disorders.  The patient is aware that the response rates discussed earlier is not guaranteed.  After a long discussion, patient made an informed decision to proceed with the prescribed plan of care and went ahead to sign the consent form today.   Patient education material was dispensed.  Treatment decision is based on NCCN guidelines, 100 mg/m2 every 3 weeks for total of 3 cycles with concurrent radiation treatment. References as follows:  Postoperative Concurrent Radiotherapy and Chemotherapy for High-Risk Squamous-Cell Carcinoma of the Head and Neck Ulice Dash S. Burt Knack, M.D., Lucille Passy. Georjean Mode, Ph.D., Arlene A. Jasper Loser, M.D., Blase Mess, M.D., Darrick Penna. Megan Salon, M.D., [place] Jonna Munro, M.D., Domingo Cocking. Lyndon Code, M.D., Sallye Lat, M.D., Luciano Cutter, M.D., Polly Cobia, M.D., Minerva Ends, M.D., Fulton Mole.  Patrina Levering, M.D., K.S. Mariane Duval, M.D., Trinidad Curet, M.D., Meryl Dare, M.D., and Danae Orleans. Fu, M.D. for the [facility] [address] [employer] [facility] 2004; [phone] 6, 2004DOI: [id number]     Throat pain in adult I discussed pain management with the patient. He is comfortable with the current pain medication regimen and declined higher dose of narcotics prescription. I refilled both tramadol and Percocet for him.    Elevated serum creatinine This is stable. I recommend discontinuation of ibuprofen and all over-the-counter medication. After his treatment is started, he would need weekly blood work to monitor his kidney function carefully.   No orders of the defined types were placed in this encounter.   All questions were answered. The patient knows to call the clinic with any problems, questions or concerns. No barriers to learning was detected. I spent 40 minutes counseling the patient face to  face. The total time spent in the appointment was 55 minutes and more than 50% was on counseling and review of test results     [facility], [place], MD 10/08/2013 8:29 PM

## 2013-10-08 NOTE — Assessment & Plan Note (Signed)
He is waiting for port placement and feeding tube before the start of treatment. I plan to start him on 10/21/2013. We discussed the role of chemotherapy. The intent is for cure.  We discussed some of the risks, benefits, side-effects of cisplatin. Some of the short term side-effects included, though not limited to, including weight loss, life threatening infections, risk of allergic reactions, need for transfusions of blood products, nausea, vomiting, change in bowel habits, loss of hair, admission to hospital for various reasons, and risks of death.   Long term side-effects are also discussed including risks of infertility, permanent damage to nerve function, hearing loss, chronic fatigue, kidney damage with possibility needing hemodialysis, and rare secondary malignancy including bone marrow disorders.  The patient is aware that the response rates discussed earlier is not guaranteed.  After a long discussion, patient made an informed decision to proceed with the prescribed plan of care and went ahead to sign the consent form today.   Patient education material was dispensed.  Treatment decision is based on NCCN guidelines, 100 mg/m2 every 3 weeks for total of 3 cycles with concurrent radiation treatment. References as follows:  Postoperative Concurrent Radiotherapy and Chemotherapy for High-Risk Squamous-Cell Carcinoma of the Head and Neck Ulice Dash S. Burt Knack, M.D., Lucille Passy. Georjean Mode, Ph.D., Arlene A. Jasper Loser, M.D., Blase Mess, M.D., Darrick Penna. Megan Salon, M.D., [place] Jonna Munro, M.D., Domingo Cocking. Lyndon Code, M.D., Sallye Lat, M.D., Luciano Cutter, M.D., Polly Cobia, M.D., Minerva Ends, M.D., Sandre Kitty, M.D., K.S. Mariane Duval, M.D., Trinidad Curet, M.D., Meryl Dare, M.D., and Danae Orleans. Fu, M.D. for the [facility] [address] [facility] 2004; [id number], 2004DOI: [id number]

## 2013-10-08 NOTE — Patient Instructions (Signed)
Plan: 1. Proceed with insertion of fluoride trays and scatter protection devices. Patient then to have simulation later today with Dr. Valere Dross. 2. Brush teeth after meals and at bedtime. Floss at bedtime. 3. Use fluoride in trays at bedtime as instructed. 4. Use trismus device and exercises as instructed daily 5. Continue saltwater rinses as needed to aid healing for the dental extractions 6 . A prescription for FluoriSHIELD was sent to the outpatient pharmacy at [facility]. 7. Patient to be cleared for radiation therapy by Dr. Richrd Sox  Trismus is a condition where the jaw does not allow the mouth to open as wide as it usually does.  This can happen almost suddenly, or in other cases the process is so slow, it is hard to notice it-until it is too far along.  When the jaw joints and/or muscles have been exposed to radiation treatments, the onset of Trismus is very slow.  This is because the muscles are losing their stretching ability over a long period of time, as long as 2 YEARS after the end of radiation.  It is therefore important to exercise these muscles and joints.  TRISMUS EXERCISES   Stack of tongue depressors measuring the same or a little less than the last documented MIO (Maximum Interincisal Opening).  Secure them with a rubber band on both ends.  Place the stack in the patient's mouth, supporting the other end.  Allow 30 seconds for muscle stretching.  Rest for a few seconds.  Repeat 3-5 times  For all radiation patients, this exercise is recommended in the mornings and evenings unless otherwise instructed.  The exercise should be done for a period of 2 YEARS after the end of radiation.  MIO should be checked routinely on recall dental visits by the general dentist or the hospital dentist.  The patient is advised to report any changes, soreness, or difficulties encountered when doing the exercises.  FLUORIDE TRAYS PATIENT INSTRUCTIONS    Obtain  prescription from the pharmacy.  Don't be surprised if it needs to be ordered.  Be sure to let the pharmacy know when you are close to needing a new refill for them to have it ready for you without interruption of Fluoride use.  The best time to use your Fluoride is before bed time.  You must brush your teeth very well and floss before using the Fluoride in order to get the best use out of the Fluoride treatments.  Place 1 drop of Fluoride gel per tooth in the tray.  Place the tray on your lower teeth and your upper teeth.  Make sure the trays are seated all the way.  Remember, they only fit one way on your teeth.  Insert for 5 full minutes.  At the end of the 5 minutes, take the trays out.  SPIT OUT excess. .  Do NOT rinse your mouth!  Do NOT eat or drink after treatments for at least 30 minutes.  This is why the best time for your treatments is before bedtime.  Clean the inside of your Fluoride trays using COLD WATER and a toothbrush.  In order to keep your Trays from discoloring and free from odors, soak them overnight in denture cleaners such as Efferdent.  Do not use bleach or non denture products.  Store the trays in a safe dry place AWAY from any heat until your next treatment.  If anything happens to your Fluoride trays, or they don't fit as well after any dental  work, please let us know as soon as possible.

## 2013-10-08 NOTE — Assessment & Plan Note (Signed)
This is stable. I recommend discontinuation of ibuprofen and all over-the-counter medication. After his treatment is started, he would need weekly blood work to monitor his kidney function carefully.

## 2013-10-08 NOTE — Progress Notes (Signed)
10/08/2013  Patient:            [name] Wu Date of Birth:  [birth year] MRN:                [id number]  BP 121/71  Pulse 67  Temp(Src) 98.4 F (36.9 C) (Oral)  [name] now presents for insertion of upper and lower fluoride trays and scatter protection devices. The patient recently had tooth numbers 18, 30, and 31 extracted with alveoloplasty by Dr. Lewanda Rife on 8//27/15.  Patient refused to proceed with bilateral mandibular tori reductions at the time of the surgery. Patient also had his teeth cleaned by Dr. Oneida Arenas on Wednesday, October 02, 2013.  SUBJECTIVE: Patient denies having any significant oral discomfort from the surgery. OBJECTIVE: There is no sign of infection, heme, or ooze. Sutures present on right side. 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  Patient cautioned about traumatizing mandibular edentulous alveolar ridges with food.  ASSESSMENT: 1. Loss of teeth secondary to extraction as part of a preradiation therapy dental protocol  Plan: 1. Proceed with insertion of fluoride trays and scatter protection devices. Patient then to have simulation later today with Dr. Valere Dross. 2. Brush teeth after meals and at bedtime. Floss at bedtime. 3. Use fluoride in trays at bedtime as instructed. 4. Use trismus device and exercises as instructed daily 5. Continue salt water rinses as needed to aid healing for the dental extractions 6 . A prescription for FluoriSHIELD was sent to the outpatient pharmacy at [facility]. 7. Patient to be cleared for radiation therapy by Dr. Lewanda Rife  PROCEDURE: Appliances were tried in and adjusted as needed. [place] Trismus device was fabricated 40 mm using 25 sticks. Postop instructions were provided and a written and verbal format concerning the use and care of appliances. All questions were answered. Patient to return to clinic for periodic oral examination in approximately 2-3 weeks during radiation  therapy. Patient to call if questions or problems arise before then.  Lenn Cal, DDS

## 2013-10-08 NOTE — Telephone Encounter (Signed)
gv and printed appt sched and avs for pt for Sept.....sed added tx °

## 2013-10-10 ENCOUNTER — Other Ambulatory Visit: Payer: Self-pay | Admitting: Hematology and Oncology

## 2013-10-10 ENCOUNTER — Telehealth: Payer: Self-pay | Admitting: *Deleted

## 2013-10-10 NOTE — Telephone Encounter (Signed)
Patient's wife called to inform that surgery for PEG and port placement is scheduled for 9/10.  She requested that [place] for PEG instructed be arranged accordingly.  Dr. Jacklynn Lewis and RN Cameo informed.  Gayleen Orem, RN, BSN, [place] at [place] [phone]

## 2013-10-11 ENCOUNTER — Encounter (HOSPITAL_COMMUNITY): Payer: Self-pay | Admitting: Pharmacy Technician

## 2013-10-11 NOTE — Progress Notes (Signed)
To provide support and encouragement, care continuity and to assess for needs, met with patient during [place] appt with Dr. Alvy Bimler.  Patient wife agreed to inform me when appt for [facility] and PEG placement scheduled so arrangements can be made for home health visit.  Gayleen Orem, RN, BSN, [place] at [place] [phone]

## 2013-10-15 ENCOUNTER — Other Ambulatory Visit: Payer: Self-pay | Admitting: *Deleted

## 2013-10-15 DIAGNOSIS — C01 Malignant neoplasm of base of tongue: Secondary | ICD-10-CM

## 2013-10-15 NOTE — Progress Notes (Signed)
Left VM for Lurlean Leyden, RN w/ [facility] to notify of new order for PEG tube teaching.

## 2013-10-16 ENCOUNTER — Telehealth: Payer: Self-pay | Admitting: Hematology and Oncology

## 2013-10-16 ENCOUNTER — Encounter (HOSPITAL_COMMUNITY): Payer: Self-pay

## 2013-10-16 ENCOUNTER — Encounter (HOSPITAL_COMMUNITY)
Admission: RE | Admit: 2013-10-16 | Discharge: 2013-10-16 | Disposition: A | Discharge status: Home / Self Care | Payer: BC Managed Care – PPO | Source: Ambulatory Visit | Attending: General Surgery | Admitting: General Surgery

## 2013-10-16 DIAGNOSIS — G4730 Sleep apnea, unspecified: Secondary | ICD-10-CM | POA: Diagnosis not present

## 2013-10-16 DIAGNOSIS — C01 Malignant neoplasm of base of tongue: Secondary | ICD-10-CM | POA: Diagnosis present

## 2013-10-16 DIAGNOSIS — Z8611 Personal history of tuberculosis: Secondary | ICD-10-CM | POA: Diagnosis not present

## 2013-10-16 LAB — CBC
HCT: 42.6 % (ref 39.0–52.0)
Hemoglobin: 14.5 g/dL (ref 13.0–17.0)
MCH: 31 pg (ref 26.0–34.0)
MCHC: 34 g/dL (ref 30.0–36.0)
MCV: 91 fL (ref 78.0–100.0)
Platelets: 269 10*3/uL (ref 150–400)
RBC: 4.68 MIL/uL (ref 4.22–5.81)
RDW: 12.9 % (ref 11.5–15.5)
WBC: 7.3 10*3/uL (ref 4.0–10.5)

## 2013-10-16 LAB — BASIC METABOLIC PANEL
Anion gap: 13 (ref 5–15)
BUN: 14 mg/dL (ref 6–23)
CO2: 24 mEq/L (ref 19–32)
Calcium: 9.3 mg/dL (ref 8.4–10.5)
Chloride: 102 mEq/L (ref 96–112)
Creatinine, Ser: 1.19 mg/dL (ref 0.50–1.35)
GFR calc Af Amer: 76 mL/min — ABNORMAL LOW (ref >90)
GFR calc non Af Amer: 66 mL/min — ABNORMAL LOW (ref >90)
Glucose, Bld: 98 mg/dL (ref 70–99)
Potassium: 4.4 mEq/L (ref 3.7–5.3)
Sodium: 139 mEq/L (ref 137–147)

## 2013-10-16 MED ORDER — CIPROFLOXACIN IN D5W 400 MG/200ML IV SOLN
400.0000 mg | INTRAVENOUS | Status: AC
  Administered 2013-10-17: 400 mg via INTRAVENOUS
  Filled 2013-10-16: qty 200

## 2013-10-16 NOTE — Telephone Encounter (Signed)
s.w. pt and advised on new sched...they will pick up new sched friday

## 2013-10-16 NOTE — Progress Notes (Signed)
Primary -milsap No cardiologist No recent cardiac testing

## 2013-10-16 NOTE — Pre-Procedure Instructions (Signed)
[name] Wu  10/16/2013   Your procedure is scheduled on:  Thursday, September 10th  Report to [facility] Admitting at 1030 AM.  Call this number if you have problems the morning of surgery: [phone]   Remember:   Do not eat food or drink liquids after midnight.   Take these medicines the morning of surgery with A SIP OF WATER: hydrocodone if needed, zofran if needed   Do not wear jewelry.  Do not wear lotions, powders, or perfumes. You may wear deodorant.  Do not shave 48 hours prior to surgery. Men may shave face and neck.  Do not bring valuables to the hospital.  [facility] is not responsible  for any belongings or valuables.               Contacts, dentures or bridgework may not be worn into surgery.  Leave suitcase in the car. After surgery it may be brought to your room.  For patients admitted to the hospital, discharge time is determined by your  treatment team.               Patients discharged the day of surgery will not be allowed to drive home.  Please read over the following fact sheets that you were given: Pain Booklet, Coughing and Deep Breathing and Surgical Site Infection Prevention [place] - Preparing for Surgery  Before surgery, you can play an important role.  Because skin is not sterile, your skin needs to be as free of germs as possible.  You can reduce the number of germs on you skin by washing with CHG (chlorahexidine gluconate) soap before surgery.  CHG is an antiseptic cleaner which kills germs and bonds with the skin to continue killing germs even after washing.  Please DO NOT use if you have an allergy to CHG or antibacterial soaps.  If your skin becomes reddened/irritated stop using the CHG and inform your nurse when you arrive at Short Stay.  Do not shave (including legs and underarms) for at least 48 hours prior to the first CHG shower.  You may shave your face.  Please follow these instructions carefully:   1.  Shower with CHG  Soap the night before surgery and the morning of Surgery.  2.  If you choose to wash your hair, wash your hair first as usual with your normal shampoo.  3.  After you shampoo, rinse your hair and body thoroughly to remove the shampoo.  4.  Use CHG as you would any other liquid soap.  You can apply CHG directly to the skin and wash gently with scrungie or a clean washcloth.  5.  Apply the CHG Soap to your body ONLY FROM THE NECK DOWN.  Do not use on open wounds or open sores.  Avoid contact with your eyes, ears, mouth and genitals (private parts).  Wash genitals (private parts) with your normal soap.  6.  Wash thoroughly, paying special attention to the area where your surgery will be performed.  7.  Thoroughly rinse your body with warm water from the neck down.  8.  DO NOT shower/wash with your normal soap after using and rinsing off the CHG Soap.  9.  Pat yourself dry with a clean towel.            10.  Wear clean pajamas.            11.  Place clean sheets on your bed the night of your first  shower and do not sleep with pets.  Day of Surgery  Do not apply any lotions/deoderants the morning of surgery.  Please wear clean clothes to the hospital/surgery center.

## 2013-10-17 ENCOUNTER — Encounter: Payer: Self-pay | Admitting: Radiation Oncology

## 2013-10-17 ENCOUNTER — Encounter (HOSPITAL_COMMUNITY): Payer: BC Managed Care – PPO | Admitting: Certified Registered"

## 2013-10-17 ENCOUNTER — Ambulatory Visit (HOSPITAL_COMMUNITY): Payer: BC Managed Care – PPO

## 2013-10-17 ENCOUNTER — Encounter (HOSPITAL_COMMUNITY)
Admission: RE | Disposition: A | Discharge status: Home / Self Care | Payer: Self-pay | Source: Ambulatory Visit | Attending: General Surgery

## 2013-10-17 ENCOUNTER — Ambulatory Visit (HOSPITAL_COMMUNITY)
Admission: RE | Admit: 2013-10-17 | Discharge: 2013-10-18 | Disposition: A | Discharge status: Home / Self Care | Payer: BC Managed Care – PPO | Source: Ambulatory Visit | Attending: General Surgery | Admitting: General Surgery

## 2013-10-17 ENCOUNTER — Ambulatory Visit (HOSPITAL_COMMUNITY): Payer: BC Managed Care – PPO | Admitting: Certified Registered"

## 2013-10-17 ENCOUNTER — Encounter (HOSPITAL_COMMUNITY): Payer: Self-pay | Admitting: *Deleted

## 2013-10-17 DIAGNOSIS — C01 Malignant neoplasm of base of tongue: Secondary | ICD-10-CM | POA: Diagnosis not present

## 2013-10-17 DIAGNOSIS — Z510 Encounter for antineoplastic radiation therapy: Secondary | ICD-10-CM | POA: Diagnosis not present

## 2013-10-17 DIAGNOSIS — G4730 Sleep apnea, unspecified: Secondary | ICD-10-CM | POA: Insufficient documentation

## 2013-10-17 DIAGNOSIS — C029 Malignant neoplasm of tongue, unspecified: Secondary | ICD-10-CM

## 2013-10-17 DIAGNOSIS — Z8611 Personal history of tuberculosis: Secondary | ICD-10-CM | POA: Insufficient documentation

## 2013-10-17 HISTORY — PX: GASTROSTOMY TUBE PLACEMENT: SHX655

## 2013-10-17 HISTORY — PX: GASTROJEJUNOSTOMY: SHX1697

## 2013-10-17 HISTORY — DX: Personal history of other diseases of the digestive system: Z87.19

## 2013-10-17 HISTORY — DX: Dependence on other enabling machines and devices: Z99.89

## 2013-10-17 HISTORY — PX: PORTACATH PLACEMENT: SHX2246

## 2013-10-17 HISTORY — DX: Obstructive sleep apnea (adult) (pediatric): G47.33

## 2013-10-17 HISTORY — DX: Personal history of peptic ulcer disease: Z87.11

## 2013-10-17 LAB — CBC
HCT: 46.3 % (ref 39.0–52.0)
Hemoglobin: 15.5 g/dL (ref 13.0–17.0)
MCH: 30.9 pg (ref 26.0–34.0)
MCHC: 33.5 g/dL (ref 30.0–36.0)
MCV: 92.2 fL (ref 78.0–100.0)
Platelets: 282 10*3/uL (ref 150–400)
RBC: 5.02 MIL/uL (ref 4.22–5.81)
RDW: 13 % (ref 11.5–15.5)
WBC: 11.6 10*3/uL — ABNORMAL HIGH (ref 4.0–10.5)

## 2013-10-17 LAB — CREATININE, SERUM
Creatinine, Ser: 1.12 mg/dL (ref 0.50–1.35)
GFR calc Af Amer: 82 mL/min — ABNORMAL LOW (ref >90)
GFR calc non Af Amer: 71 mL/min — ABNORMAL LOW (ref >90)

## 2013-10-17 SURGERY — INSERTION, TUNNELED CENTRAL VENOUS DEVICE, WITH PORT
Anesthesia: General | Site: Chest

## 2013-10-17 MED ORDER — LACTATED RINGERS IV SOLN
INTRAVENOUS | Status: DC
  Administered 2013-10-17: 11:00:00 via INTRAVENOUS

## 2013-10-17 MED ORDER — NEOSTIGMINE METHYLSULFATE 10 MG/10ML IV SOLN
INTRAVENOUS | Status: DC | PRN
  Administered 2013-10-17: 5 mg via INTRAVENOUS

## 2013-10-17 MED ORDER — ONDANSETRON HCL 4 MG PO TABS: 4.0000 mg | ORAL_TABLET | Freq: Four times a day (QID) | ORAL | Status: DC | PRN

## 2013-10-17 MED ORDER — FENTANYL CITRATE 0.05 MG/ML IJ SOLN
INTRAMUSCULAR | Status: AC
  Filled 2013-10-17: qty 5

## 2013-10-17 MED ORDER — ONDANSETRON HCL 4 MG PO TABS: 8.0000 mg | ORAL_TABLET | Freq: Three times a day (TID) | ORAL | Status: DC | PRN

## 2013-10-17 MED ORDER — LIDOCAINE HCL 1 % IJ SOLN
INTRAMUSCULAR | Status: DC | PRN
  Administered 2013-10-17: 13:00:00

## 2013-10-17 MED ORDER — OXYCODONE HCL 5 MG PO TABS: 5.0000 mg | ORAL_TABLET | Freq: Once | ORAL | Status: DC | PRN

## 2013-10-17 MED ORDER — TRAMADOL HCL 50 MG PO TABS: 50.0000 mg | ORAL_TABLET | Freq: Four times a day (QID) | ORAL | Status: DC | PRN

## 2013-10-17 MED ORDER — FENTANYL CITRATE 0.05 MG/ML IJ SOLN
INTRAMUSCULAR | Status: DC | PRN
  Administered 2013-10-17 (×2): 50 ug via INTRAVENOUS
  Administered 2013-10-17: 150 ug via INTRAVENOUS

## 2013-10-17 MED ORDER — MIDAZOLAM HCL 2 MG/2ML IJ SOLN
INTRAMUSCULAR | Status: AC
  Filled 2013-10-17: qty 2

## 2013-10-17 MED ORDER — ONDANSETRON HCL 4 MG/2ML IJ SOLN
INTRAMUSCULAR | Status: DC | PRN
  Administered 2013-10-17: 4 mg via INTRAVENOUS

## 2013-10-17 MED ORDER — ONDANSETRON HCL 4 MG/2ML IJ SOLN
INTRAMUSCULAR | Status: AC
Start: 2013-10-17 — End: 2013-10-17
  Filled 2013-10-17: qty 2

## 2013-10-17 MED ORDER — DEXAMETHASONE SODIUM PHOSPHATE 10 MG/ML IJ SOLN
INTRAMUSCULAR | Status: DC | PRN
  Administered 2013-10-17: 10 mg via INTRAVENOUS

## 2013-10-17 MED ORDER — ROCURONIUM BROMIDE 50 MG/5ML IV SOLN
INTRAVENOUS | Status: AC
  Filled 2013-10-17: qty 1

## 2013-10-17 MED ORDER — ENOXAPARIN SODIUM 40 MG/0.4ML ~~LOC~~ SOLN
40.0000 mg | SUBCUTANEOUS | Status: DC
  Filled 2013-10-17: qty 0.4

## 2013-10-17 MED ORDER — LACTATED RINGERS IV SOLN
INTRAVENOUS | Status: DC | PRN
  Administered 2013-10-17 (×2): via INTRAVENOUS

## 2013-10-17 MED ORDER — MIDAZOLAM HCL 5 MG/5ML IJ SOLN
INTRAMUSCULAR | Status: DC | PRN
  Administered 2013-10-17: 2 mg via INTRAVENOUS

## 2013-10-17 MED ORDER — SODIUM FLUORIDE 1.1 % DT GEL
1.0000 "application " | Freq: Every day | DENTAL | Status: DC
  Filled 2013-10-17: qty 56

## 2013-10-17 MED ORDER — MORPHINE SULFATE 2 MG/ML IJ SOLN: 1.0000 mg | INTRAMUSCULAR | Status: DC | PRN

## 2013-10-17 MED ORDER — FENTANYL CITRATE 0.05 MG/ML IJ SOLN: 25.0000 ug | INTRAMUSCULAR | Status: DC | PRN

## 2013-10-17 MED ORDER — SODIUM CHLORIDE 0.9 % IR SOLN
Status: DC | PRN
  Administered 2013-10-17 (×2)

## 2013-10-17 MED ORDER — GLYCOPYRROLATE 0.2 MG/ML IJ SOLN
INTRAMUSCULAR | Status: DC | PRN
  Administered 2013-10-17: .8 mg via INTRAVENOUS

## 2013-10-17 MED ORDER — HEPARIN SOD (PORK) LOCK FLUSH 100 UNIT/ML IV SOLN
INTRAVENOUS | Status: DC | PRN
  Administered 2013-10-17: 500 [IU] via INTRAVENOUS

## 2013-10-17 MED ORDER — LIDOCAINE HCL (CARDIAC) 20 MG/ML IV SOLN
INTRAVENOUS | Status: AC
  Filled 2013-10-17: qty 5

## 2013-10-17 MED ORDER — PROCHLORPERAZINE MALEATE 10 MG PO TABS
10.0000 mg | ORAL_TABLET | Freq: Four times a day (QID) | ORAL | Status: DC | PRN
  Filled 2013-10-17: qty 1

## 2013-10-17 MED ORDER — BUPIVACAINE-EPINEPHRINE 0.25% -1:200000 IJ SOLN: INTRAMUSCULAR | Status: DC | PRN

## 2013-10-17 MED ORDER — SUCCINYLCHOLINE CHLORIDE 20 MG/ML IJ SOLN
INTRAMUSCULAR | Status: DC | PRN
  Administered 2013-10-17: 100 mg via INTRAVENOUS

## 2013-10-17 MED ORDER — DIPHENHYDRAMINE HCL 50 MG/ML IJ SOLN
10.0000 mg | Freq: Once | INTRAMUSCULAR | Status: AC
  Administered 2013-10-17: 10 mg via INTRAVENOUS

## 2013-10-17 MED ORDER — INFLUENZA VAC SPLIT QUAD 0.5 ML IM SUSY
0.5000 mL | PREFILLED_SYRINGE | INTRAMUSCULAR | Status: AC
  Administered 2013-10-18: 0.5 mL via INTRAMUSCULAR
  Filled 2013-10-17: qty 0.5

## 2013-10-17 MED ORDER — CHLORHEXIDINE GLUCONATE 0.12 % MT SOLN: 15.0000 mL | Freq: Every day | OROMUCOSAL | Status: DC

## 2013-10-17 MED ORDER — LIDOCAINE HCL (CARDIAC) 20 MG/ML IV SOLN
INTRAVENOUS | Status: DC | PRN
  Administered 2013-10-17: 100 mg via INTRAVENOUS

## 2013-10-17 MED ORDER — PROMETHAZINE HCL 25 MG RE SUPP: 25.0000 mg | Freq: Four times a day (QID) | RECTAL | Status: DC | PRN

## 2013-10-17 MED ORDER — CIPROFLOXACIN IN D5W 400 MG/200ML IV SOLN
400.0000 mg | Freq: Two times a day (BID) | INTRAVENOUS | Status: AC
  Administered 2013-10-17: 400 mg via INTRAVENOUS
  Filled 2013-10-17: qty 200

## 2013-10-17 MED ORDER — ROCURONIUM BROMIDE 100 MG/10ML IV SOLN
INTRAVENOUS | Status: DC | PRN
  Administered 2013-10-17: 25 mg via INTRAVENOUS
  Administered 2013-10-17: 40 mg via INTRAVENOUS
  Administered 2013-10-17: 10 mg via INTRAVENOUS

## 2013-10-17 MED ORDER — MEPERIDINE HCL 25 MG/ML IJ SOLN: 6.2500 mg | INTRAMUSCULAR | Status: DC | PRN

## 2013-10-17 MED ORDER — ONDANSETRON HCL 4 MG/2ML IJ SOLN: 4.0000 mg | Freq: Four times a day (QID) | INTRAMUSCULAR | Status: DC | PRN

## 2013-10-17 MED ORDER — KCL IN DEXTROSE-NACL 20-5-0.45 MEQ/L-%-% IV SOLN
INTRAVENOUS | Status: AC
  Administered 2013-10-17: 19:00:00 via INTRAVENOUS
  Filled 2013-10-17 (×2): qty 1000

## 2013-10-17 MED ORDER — PROMETHAZINE HCL 25 MG/ML IJ SOLN: 6.2500 mg | INTRAMUSCULAR | Status: DC | PRN

## 2013-10-17 MED ORDER — OXYCODONE HCL 5 MG/5ML PO SOLN: 5.0000 mg | Freq: Once | ORAL | Status: DC | PRN

## 2013-10-17 MED ORDER — 0.9 % SODIUM CHLORIDE (POUR BTL) OPTIME
TOPICAL | Status: DC | PRN
  Administered 2013-10-17: 1000 mL

## 2013-10-17 MED ORDER — ARTIFICIAL TEARS OP OINT
TOPICAL_OINTMENT | OPHTHALMIC | Status: DC | PRN
  Administered 2013-10-17: 1 via OPHTHALMIC

## 2013-10-17 MED ORDER — PROPOFOL 10 MG/ML IV BOLUS
INTRAVENOUS | Status: AC
  Filled 2013-10-17: qty 20

## 2013-10-17 MED ORDER — HYDROCODONE-ACETAMINOPHEN 7.5-325 MG PO TABS
1.0000 | ORAL_TABLET | Freq: Four times a day (QID) | ORAL | Status: DC | PRN
  Administered 2013-10-17 – 2013-10-18 (×2): 1 via ORAL
  Filled 2013-10-17: qty 2
  Filled 2013-10-17: qty 1

## 2013-10-17 MED ORDER — PROPOFOL 10 MG/ML IV BOLUS
INTRAVENOUS | Status: DC | PRN
  Administered 2013-10-17: 130 mg via INTRAVENOUS

## 2013-10-17 SURGICAL SUPPLY — 113 items
ADH SKN CLS APL DERMABOND .7 (GAUZE/BANDAGES/DRESSINGS) ×4
APPLIER CLIP LOGIC TI 5 (MISCELLANEOUS) IMPLANT
APPLIER CLIP ROT 10 11.4 M/L (STAPLE)
APR CLP MED LRG 11.4X10 (STAPLE)
APR CLP MED LRG 33X5 (MISCELLANEOUS)
BAG DECANTER FOR FLEXI CONT (MISCELLANEOUS) ×3 IMPLANT
BAG URINE DRAINAGE (UROLOGICAL SUPPLIES) ×1 IMPLANT
BLADE SURG 11 STRL SS (BLADE) ×1 IMPLANT
BLADE SURG 15 STRL LF DISP TIS (BLADE) IMPLANT
BLADE SURG 15 STRL SS (BLADE) ×3
CABLE HIGH FREQUENCY MONO STRZ (ELECTRODE) IMPLANT
CANISTER SUCTION 2500CC (MISCELLANEOUS) ×3 IMPLANT
CHLORAPREP W/TINT 10.5 ML (MISCELLANEOUS) ×3 IMPLANT
CHLORAPREP W/TINT 26ML (MISCELLANEOUS) ×3 IMPLANT
CLAMP ENDO BABCK 10MM (STAPLE) IMPLANT
CLIP APPLIE ROT 10 11.4 M/L (STAPLE) IMPLANT
COVER SURGICAL LIGHT HANDLE (MISCELLANEOUS) ×3 IMPLANT
COVER TRANSDUCER ULTRASND (DRAPES) IMPLANT
CRADLE DONUT ADULT HEAD (MISCELLANEOUS) ×3 IMPLANT
DECANTER SPIKE VIAL GLASS SM (MISCELLANEOUS) ×6 IMPLANT
DERMABOND ADVANCED (GAUZE/BANDAGES/DRESSINGS) ×2
DERMABOND ADVANCED .7 DNX12 (GAUZE/BANDAGES/DRESSINGS) ×2 IMPLANT
DEVICE SUT CK QUICK LOAD MINI (Prosthesis & Implant Heart) ×1 IMPLANT
DEVICE SUTURE ENDOST 10MM (ENDOMECHANICALS) ×1 IMPLANT
DISSECTOR BLUNT TIP ENDO 5MM (MISCELLANEOUS) IMPLANT
DRAIN CHANNEL 19F RND (DRAIN) IMPLANT
DRAIN PENROSE 1/2X36 STERILE (WOUND CARE) IMPLANT
DRAPE C-ARM 42X72 X-RAY (DRAPES) ×3 IMPLANT
DRAPE CHEST BREAST 15X10 FENES (DRAPES) ×3 IMPLANT
DRAPE UTILITY 15X26 W/TAPE STR (DRAPE) ×6 IMPLANT
DRAPE WARM FLUID 44X44 (DRAPE) IMPLANT
ELECT CAUTERY BLADE 6.4 (BLADE) ×1 IMPLANT
ELECT COATED BLADE 2.86 ST (ELECTRODE) ×3 IMPLANT
ELECT REM PT RETURN 9FT ADLT (ELECTROSURGICAL) ×3
ELECTRODE REM PT RTRN 9FT ADLT (ELECTROSURGICAL) ×2 IMPLANT
EVACUATOR SILICONE 100CC (DRAIN) IMPLANT
GAUZE SPONGE 4X4 12PLY STRL (GAUZE/BANDAGES/DRESSINGS) ×1 IMPLANT
GAUZE SPONGE 4X4 16PLY XRAY LF (GAUZE/BANDAGES/DRESSINGS) ×4 IMPLANT
GLOVE BIO SURGEON STRL SZ 6 (GLOVE) ×3 IMPLANT
GLOVE BIO SURGEON STRL SZ 6.5 (GLOVE) ×5 IMPLANT
GLOVE BIO SURGEON STRL SZ7 (GLOVE) ×3 IMPLANT
GLOVE BIOGEL PI IND STRL 6.5 (GLOVE) ×2 IMPLANT
GLOVE BIOGEL PI IND STRL 7.0 (GLOVE) IMPLANT
GLOVE BIOGEL PI IND STRL 8 (GLOVE) ×2 IMPLANT
GLOVE BIOGEL PI INDICATOR 6.5 (GLOVE) ×1
GLOVE BIOGEL PI INDICATOR 7.0 (GLOVE) ×5
GLOVE BIOGEL PI INDICATOR 8 (GLOVE) ×1
GLOVE ECLIPSE 8.0 STRL XLNG CF (GLOVE) ×3 IMPLANT
GOWN STRL REUS W/ TWL LRG LVL3 (GOWN DISPOSABLE) ×4 IMPLANT
GOWN STRL REUS W/ TWL XL LVL3 (GOWN DISPOSABLE) ×2 IMPLANT
GOWN STRL REUS W/TWL 2XL LVL3 (GOWN DISPOSABLE) ×3 IMPLANT
GOWN STRL REUS W/TWL LRG LVL3 (GOWN DISPOSABLE) ×6
GOWN STRL REUS W/TWL XL LVL3 (GOWN DISPOSABLE) ×3
KIT BASIN OR (CUSTOM PROCEDURE TRAY) ×3 IMPLANT
KIT DEVICE SUT COR-KNOT MIS 5 (INSTRUMENTS) ×1 IMPLANT
KIT PORT POWER 8FR ISP CVUE (Catheter) ×1 IMPLANT
KIT PORT POWER 9.6FR MRI PREA (Catheter) IMPLANT
KIT PORT POWER ISP 8FR (Catheter) IMPLANT
KIT POWER CATH 8FR (Catheter) IMPLANT
KIT ROOM TURNOVER OR (KITS) ×3 IMPLANT
LEGGING LITHOTOMY PAIR STRL (DRAPES) ×3 IMPLANT
LIGASURE 5MM LAPAROSCOPIC (INSTRUMENTS) IMPLANT
NDL HYPO 25GX1X1/2 BEV (NEEDLE) ×2 IMPLANT
NDL SPNL 22GX3.5 QUINCKE BK (NEEDLE) IMPLANT
NEEDLE 22X1 1/2 (OR ONLY) (NEEDLE) ×3 IMPLANT
NEEDLE HYPO 25GX1X1/2 BEV (NEEDLE) ×3 IMPLANT
NEEDLE SPNL 22GX3.5 QUINCKE BK (NEEDLE) ×3 IMPLANT
NS IRRIG 1000ML POUR BTL (IV SOLUTION) ×3 IMPLANT
PACK SURGICAL SETUP 50X90 (CUSTOM PROCEDURE TRAY) ×3 IMPLANT
PAD ARMBOARD 7.5X6 YLW CONV (MISCELLANEOUS) ×6 IMPLANT
PENCIL BUTTON HOLSTER BLD 10FT (ELECTRODE) ×4 IMPLANT
RELOAD ENDO STITCH 2.0 (ENDOMECHANICALS) ×15
RELOAD SUT SNGL STCH BLK 2-0 (ENDOMECHANICALS) IMPLANT
SCALPEL HARMONIC ACE (MISCELLANEOUS) ×3 IMPLANT
SCISSORS LAP 5X35 DISP (ENDOMECHANICALS) ×3 IMPLANT
SET IRRIG TUBING LAPAROSCOPIC (IRRIGATION / IRRIGATOR) ×3 IMPLANT
SET IRRIG Y TYPE TUR BLADDER L (SET/KITS/TRAYS/PACK) ×1 IMPLANT
SLEEVE ENDOPATH XCEL 5M (ENDOMECHANICALS) ×6 IMPLANT
STAPLER VISISTAT 35W (STAPLE) IMPLANT
SUT ETHIBOND CT1 BRD #0 30IN (SUTURE) ×12 IMPLANT
SUT ETHIBOND NAB CT1 #1 30IN (SUTURE) IMPLANT
SUT ETHILON 2 0 FS 18 (SUTURE) ×2 IMPLANT
SUT MNCRL AB 4-0 PS2 18 (SUTURE) ×4 IMPLANT
SUT MON AB 4-0 PC3 18 (SUTURE) ×3 IMPLANT
SUT PROLENE 2 0 SH DA (SUTURE) ×6 IMPLANT
SUT RELOAD ENDO STITCH 2.0 (ENDOMECHANICALS) ×10
SUT SILK 2 0 SH (SUTURE) IMPLANT
SUT SURGIDAC NAB ES-9 0 48 120 (SUTURE) IMPLANT
SUT VIC AB 2-0 SH 27 (SUTURE)
SUT VIC AB 2-0 SH 27X BRD (SUTURE) IMPLANT
SUT VIC AB 3-0 SH 27 (SUTURE) ×3
SUT VIC AB 3-0 SH 27X BRD (SUTURE) ×2 IMPLANT
SUT VICRYL 0 TIES 12 18 (SUTURE) IMPLANT
SUTURE RELOAD ENDO STITCH 2.0 (ENDOMECHANICALS) ×10 IMPLANT
SYR 20ML ECCENTRIC (SYRINGE) ×6 IMPLANT
SYR 5ML LUER SLIP (SYRINGE) ×3 IMPLANT
SYR CONTROL 10ML LL (SYRINGE) ×3 IMPLANT
TIP INNERVISION DETACH 40FR (MISCELLANEOUS) IMPLANT
TIP INNERVISION DETACH 50FR (MISCELLANEOUS) IMPLANT
TIP INNERVISION DETACH 56FR (MISCELLANEOUS) IMPLANT
TIPS INNERVISION DETACH 40FR (MISCELLANEOUS)
TOWEL OR 17X24 6PK STRL BLUE (TOWEL DISPOSABLE) ×3 IMPLANT
TOWEL OR 17X26 10 PK STRL BLUE (TOWEL DISPOSABLE) ×3 IMPLANT
TRAY FOLEY CATH 14FRSI W/METER (CATHETERS) ×3 IMPLANT
TRAY LAPAROSCOPIC (CUSTOM PROCEDURE TRAY) ×3 IMPLANT
TROCAR KII 12MM C0R29 THR SEP (TROCAR) ×3 IMPLANT
TROCAR XCEL BLUNT TIP 100MML (ENDOMECHANICALS) ×1 IMPLANT
TROCAR XCEL NON-BLD 5MMX100MML (ENDOMECHANICALS) ×3 IMPLANT
TUBE CONNECTING 12X1/4 (SUCTIONS) IMPLANT
TUBING FILTER THERMOFLATOR (ELECTROSURGICAL) ×3 IMPLANT
TUBING INSUFF HIGH FLOW RTP (TUBING) ×1 IMPLANT
WATER STERILE IRR 1000ML POUR (IV SOLUTION) IMPLANT
YANKAUER SUCT BULB TIP NO VENT (SUCTIONS) IMPLANT

## 2013-10-17 NOTE — Discharge Instructions (Addendum)
[facility], [place] [phone]  ABDOMINAL SURGERY: POST OP INSTRUCTIONS  Always review your discharge instruction sheet given to you by the facility where your surgery was performed.  IF YOU HAVE DISABILITY OR FAMILY LEAVE FORMS, YOU MUST BRING THEM TO THE OFFICE FOR PROCESSING.  PLEASE DO NOT GIVE THEM TO YOUR DOCTOR.  1. A prescription for pain medication may be given to you upon discharge.  Take your pain medication as prescribed, if needed.  If narcotic pain medicine is not needed, then you may take acetaminophen (Tylenol) or ibuprofen (Advil) as needed. 2. Take your usually prescribed medications unless otherwise directed. 3. If you need a refill on your pain medication, please contact your pharmacy. They will contact our office to request authorization.  Prescriptions will not be filled after 5pm or on week-ends. 4. You should follow a light diet the first few days after arrival home, such as soup and crackers, pudding, etc.unless your doctor has advised otherwise. A high-fiber, low fat diet can be resumed as tolerated.   Be sure to include lots of fluids daily. Most patients will experience some swelling and bruising on the chest and neck area.  Ice packs will help.  Swelling and bruising can take several days to resolve 5. Most patients will experience some swelling and bruising in the area of the incision. Ice pack will help. Swelling and bruising can take several days to resolve..  6. It is common to experience some constipation if taking pain medication after surgery.  Increasing fluid intake and taking a stool softener will usually help or prevent this problem from occurring.  A mild laxative (Milk of Magnesia or Miralax) should be taken according to package directions if there are no bowel movements after 48 hours. 7.  You may have steri-strips (small skin tapes) in place directly over the incision.  These strips should be left on the skin for 10-14 days.  If your  surgeon used skin glue on the incision, you may shower in 48 hours.  The glue will flake off over the next 2-3 weeks.  Any sutures or staples will be removed at the office during your follow-up visit. You may find that a light gauze bandage over your incision may keep your staples from being rubbed or pulled. You may shower and replace the bandage daily. 8. ACTIVITIES:  You may resume regular (light) daily activities beginning the next day--such as daily self-care, walking, climbing stairs--gradually increasing activities as tolerated.  You may have sexual intercourse when it is comfortable.  Refrain from any heavy lifting or straining until approved by your doctor. a. You may drive when you no longer are taking prescription pain medication, you can comfortably wear a seatbelt, and you can safely maneuver your car and apply brakes b. Return to Work: __________3-7 weeks if applicable_________________________ 9. You should see your doctor in the office for a follow-up appointment approximately two weeks after your surgery.  Make sure that you call for this appointment within a day or two after you arrive home to insure a convenient appointment time. OTHER INSTRUCTIONS:  _____________________________________________________________ _____________________________________________________________  WHEN TO CALL YOUR DOCTOR: 1. Fever over 101.0 2. Inability to urinate 3. Nausea and/or vomiting 4. Extreme swelling or bruising 5. Continued bleeding from incision. 6. Increased pain, redness, or drainage from the incision. 7. Difficulty swallowing or breathing 8. Muscle cramping or spasms. 9. Numbness or tingling in hands or feet or around lips.  The clinic staff is  available to answer your questions during regular business hours.  Please dont hesitate to call and ask to speak to one of the nurses if you have concerns.  For further questions, please visit [email or web address]   Gastrostomy  Tube Home Guide A gastrostomy tube is a tube that is surgically placed into the stomach. It is also called a "G-tube." G-tubes are used when a person is unable to eat and drink enough on their own to stay healthy. The tube is inserted into the stomach through a small cut (incision) in the skin. This tube is used for:  Feeding.  Giving medication. GASTROSTOMY TUBE CARE  Wash your hands with soap and water.  Remove the old dressing (if any). Some styles of G-tubes may need a dressing inserted between the skin and the G-tube. Other types of G-tubes do not require a dressing. Ask your health care provider if a dressing is needed.  Check the area where the tube enters the skin (insertion site) for redness, swelling, or pus-like (purulent) drainage. A small amount of clear or tan liquid drainage is normal. Check to make sure scar tissue (skin) is not growing around the insertion site. This could have a raised, bumpy appearance.  A cotton swab can be used to clean the skin around the tube:  When the G-tube is first put in, a normal saline solution or water can be used to clean the skin.  Mild soap and warm water can be used when the skin around the G-tube site has healed.  Roll the cotton swab around the G-tube insertion site to remove any drainage or crusting at the insertion site. STOMACH RESIDUALS Feeding tube residuals are the amount of liquids that are in the stomach at any given time. Residuals may be checked before giving feedings, medications, or as instructed by your health care provider.  Ask your health care provider if there are instances when you would not start tube feedings depending on the amount or type of contents withdrawn from the stomach.  Check residuals by attaching a syringe to the G-tube and pulling back on the syringe plunger. Note the amount, and return the residual back into the stomach. FLUSHING THE G-TUBE  The G-tube should be periodically flushed with clean warm  water to keep it from clogging.  Flush the G-tube after feedings or medications. Draw up 30 mL of warm water in a syringe. Connect the syringe to the G-tube and slowly push the water into the tube.  Do not push feedings, medications, or flushes rapidly. Flush the G-tube gently and slowly.  Only use syringes made for G-tubes to flush medications or feedings.  Your health care provider may want the G-tube flushed more often or with more water. If this is the case, follow your health care provider's instructions. FEEDINGS Your health care provider will determine whether feedings are given as a bolus (a certain amount given at one time and at scheduled times) or whether feedings will be given continuously on a feeding pump.   Formulas should be given at room temperature.  If feedings are continuous, no more than 4 hours worth of feedings should be placed in the feeding bag. This helps prevent spoilage or accidental excess infusion.  Cover and place unused formula in the refrigerator.  If feedings are continuous, stop the feedings when medications or flushes are given. Be sure to restart the feedings.  Feeding bags and syringes should be replaced as instructed by your health care provider. GIVING MEDICATION  In general, it is best if all medications are in a liquid form for G-tube administration. Liquid medications are less likely to clog the G-tube.  Mix the liquid medication with 30 mL (or amount recommended by your health care provider) of warm water.  Draw up the medication into the syringe.  Attach the syringe to the G-tube and slowly push the mixture into the G-tube.  After giving the medication, draw up 30 mL of warm water in the syringe and slowly flush the G-tube.  For pills or capsules, check with your health care provider first before crushing medications. Some pills are not effective if they are crushed. Some capsules are sustained-release medications.  If appropriate,  crush the pill or capsule and mix with 30 mL of warm water. Using the syringe, slowly push the medication through the tube, then flush the tube with another 30 mL of tap water. G-TUBE PROBLEMS G-tube was pulled out.  Cause: May have been pulled out accidentally.  Solutions: Cover the opening with clean dressing and tape. Call your health care provider right away. The G-tube should be put in as soon as possible (within 4 hours) so the G-tube opening (tract) does not close. The G-tube needs to be put in at a health care setting. An X-ray needs to be done to confirm placement before the G-tube can be used again. Redness, irritation, soreness, or foul odor around the gastrostomy site.  Cause: May be caused by leakage or infection.  Solutions: Call your health care provider right away. Large amount of leakage of fluid or mucus-like liquid present (a large amount means it soaks clothing).  Cause: Many reasons could cause the G-tube to leak.  Solutions: Call your health care provider to discuss the amount of leakage. Skin or scar tissue appears to be growing where tube enters skin.   Cause: Tissue growth may develop around the insertion site if the G-tube is moved or pulled on excessively.  Solutions: Secure tube with tape so that excess movement does not occur. Call your health care provider. G-tube is clogged.  Cause: Thick formula or medication.  Solutions: Try to slowly push warm water into the tube with a large syringe. Never try to push any object into the tube to unclog it. Do not force fluid into the G-tube. If you are unable to unclog the tube, call your health care provider right away. TIPS  Head of bed (HOB) position refers to the upright position of a person's upper body.  When giving medications or a feeding bolus, keep the [facility] up as told by your health care provider. Do this during the feeding and for 1 hour after the feeding or medication administration.  If continuous  feedings are being given, it is best to keep the [facility] up as told by your health care provider. When ADLs (activities of daily living) are performed and the [facility] needs to be flat, be sure to turn the feeding pump off. Restart the feeding pump when the [facility] is returned to the recommended height.  Do not pull or put tension on the tube.  To prevent fluid backflow, kink the G-tube before removing the cap or disconnecting a syringe.  Check the G-tube length every day. Measure from the insertion site to the end of the G-tube. If the length is longer than previous measurements, the tube may be coming out. Call your health care provider if you notice increasing G-tube length.  Oral care, such as brushing teeth, must be continued.  You may need to remove excess air (vent) from the G-tube. Your health care provider will tell you if this is needed.  Always call your health care provider if you have questions or problems with the G-tube. SEEK IMMEDIATE MEDICAL CARE IF:   You have severe abdominal pain, tenderness, or abdominal bloating (distension).  You have nausea or vomiting.  You are constipated or have problems moving your bowels.  The G-tube insertion site is red, swollen, has a foul smell, or has yellow or brown drainage.  You have difficulty breathing or shortness of breath.  You have a fever.  You have a large amount of feeding tube residuals.  The G-tube is clogged and cannot be flushed. MAKE SURE YOU:   Understand these instructions.  Will watch your condition.  Will get help right away if you are not doing well or get worse. Document Released: 04/04/2001 Document Revised: 06/10/2013 Document Reviewed: 10/01/2012 [facility] Patient Information 2015 [place], [place]. This information is not intended to replace advice given to you by your health care provider. Make sure you discuss any questions you have with your health care provider.

## 2013-10-17 NOTE — Op Note (Signed)
PREOPERATIVE DIAGNOSIS:  Tongue cancer     POSTOPERATIVE DIAGNOSIS:  Same     PROCEDURE: Left subclavian port placement, Bard ClearVue  Power Port, MRI safe, 8-French, laparoscopic G tube placement, 22 Fr.      SURGEON:  Stark Klein, MD      ANESTHESIA:  General   FINDINGS:  Good venous return, easy flush, and tip of the catheter and   SVC 28 cm.      SPECIMEN:  None.      ESTIMATED BLOOD LOSS:  Minimal.      COMPLICATIONS:  None known.      PROCEDURE:  Pt was identified in the holding area and taken to   the operating room, where patient was placed supine on the operating room   table.  General anesthesia was induced.  Patient's arms were tucked and the upper   chest and neck were prepped and draped in sterile fashion.  Time-out was   performed according to the surgical safety check list.  When all was   correct, we continued.   Local anesthetic was administered over this   area at the angle of the clavicle.  The vein was accessed with 3 passes of the needle. There was good venous return and the wire passed easily with no ectopy.   Fluoroscopy was used to confirm that the wire was in the vena cava.      The patient was placed back level and the area for the pocket was anethetized   with local anesthetic.  A 3-cm transverse incision was made with a #15   blade.  Cautery was used to divide the subcutaneous tissues down to the   pectoralis muscle.  An Army-Nav[employer] retractor was used to elevate the skin   while a pocket was created on top of the pectoralis fascia.  The port   was placed into the pocket to confirm that it was of adequate size.  The   catheter was preattached to the port.  The port was then secured to the   pectoralis fascia with four 2-0 Prolene sutures.  These were clamped and   not tied down yet.    The catheter was tunneled through to the wire exit   site.  The catheter was placed along the wire to determine what length it should be to be in the SVC.  The  catheter was cut at 28 cm.  The tunneler sheath and dilator were passed over the wire and the dilator and wire were removed.  The catheter was advanced through the tunneler sheath and the tunneler sheath was pulled away.  Care was taken to keep the catheter in the tunneler sheath as this occurred. This was advanced and the tunneler sheath was removed.  There was good venous   return and easy flush of the catheter.  The Prolene sutures were tied   down to the pectoral fascia.  The skin was reapproximated using 3-0   Vicryl interrupted deep dermal sutures.    Fluoroscopy was used to re-confirm good position of the catheter.  The skin   was then closed using 4-0 Monocryl in a subcuticular fashion.  The port was flushed with concentrated heparin flush as well.  The wounds were then cleaned, dried, and dressed with Dermabond.    The abdomen was then addressed.  An infraumbilical incision was made approximately 1.5 cm in length after administration of local.  The subcutaneous tissues were divided with a Kelly clamp.  Two Kocher clamps  were used to elevate the fascia.  The midline fascia was incised with a #11 blade.  A pursestring suture was placed around the fascial incision.  The tails of the suture were used to hold the trocar in place. Pneumoperitoneum was achieved to a pressure of 15 mm Hg.    Two 5 mm trocars were placed into the right upper quadrant.  The stomach was grasped and elevated to the abdominal wall.  The endostitch was used to place two pexy sutures laterally.  A small incision was made in the abdominal wall, and the G tube was pulled through the abdominal wall.  The gastrotomy was made with the hook cautery.  The tube was advanced into the stomach and the balloon inflated.  The two lateral pexy sutures were secured with a core-knot.  Two additional pexy sutures were placed medially and secured with a Core-knot.  The tube was then pulled up to the abdominal wall and secured in place with two  2-0 nylons.    The skin incisions were closed with 4-0 monocryl in subcuticular fashion.   The patient was awakened from anesthesia and taken to the PACU in stable condition.  Needle, sponge, and instrument counts were correct.               Stark Klein, MD

## 2013-10-17 NOTE — Anesthesia Procedure Notes (Addendum)
Procedure Name: Intubation Date/Time: 10/17/2013 12:44 PM Performed by: Ollen Bowl Pre-anesthesia Checklist: Patient identified, Emergency Drugs available, Suction available, Patient being monitored and Timeout performed Patient Re-evaluated:Patient Re-evaluated prior to inductionOxygen Delivery Method: Circle system utilized and Simple face mask Preoxygenation: Pre-oxygenation with 100% oxygen Intubation Type: IV induction Ventilation: Mask ventilation with difficulty and Oral airway inserted - appropriate to patient size Laryngoscope Size: Miller and 3 Grade View: Grade II Tube type: Oral Tube size: 7.5 mm Number of attempts: 1 Airway Equipment and Method: Patient positioned with wedge pillow and Stylet Placement Confirmation: ETT inserted through vocal cords under direct vision,  positive ETCO2 and breath sounds checked- equal and bilateral Secured at: 23 cm Tube secured with: Tape Dental Injury: Teeth and Oropharynx as per pre-operative assessment

## 2013-10-17 NOTE — Interval H&P Note (Signed)
History and Physical Interval Note:  10/17/2013 12:24 PM  [name] Wu  has presented today for surgery, with the diagnosis of TONGUE CANCER  The various methods of treatment have been discussed with the patient and family. After consideration of risks, benefits and other options for treatment, the patient has consented to  Procedure(s) with comments: INSERTION PORT-A-CATH (N/A) LAPAROSCOPIC GASTROJEJUNOSTOMY (N/A) - Dr. Clyda Greener card was used please make a preference card for Dr. Barry Dienes. as a surgical intervention .  The patient's history has been reviewed, patient examined, no change in status, stable for surgery.  I have reviewed the patient's chart and labs.  Questions were answered to the patient's satisfaction.     [name],[name]

## 2013-10-17 NOTE — Anesthesia Preprocedure Evaluation (Addendum)
Anesthesia Evaluation  Patient identified by MRN, date of birth, ID band Patient awake    Reviewed: Allergy & Precautions, H&P , NPO status , Patient's Chart, lab work & pertinent test results  History of Anesthesia Complications Negative for: history of anesthetic complications  Airway Mallampati: II TM Distance: >3 FB Neck ROM: Full    Dental  (+) Edentulous Upper, Edentulous Lower   Pulmonary neg pulmonary ROS, sleep apnea and Continuous Positive Airway Pressure Ventilation ,  breath sounds clear to auscultation        Cardiovascular negative cardio ROS  Rhythm:Regular Rate:Normal     Neuro/Psych negative neurological ROS     GI/Hepatic negative GI ROS, Neg liver ROS, GERD-  Medicated and Controlled,  Endo/Other  Morbid obesity  Renal/GU negative Renal ROS     Musculoskeletal   Abdominal (+) + obese,   Peds  Hematology negative hematology ROS (+)   Anesthesia Other Findings Tongue cancer  Reproductive/Obstetrics                          Anesthesia Physical Anesthesia Plan  ASA: III  Anesthesia Plan: General   Post-op Pain Management:    Induction: Intravenous  Airway Management Planned: Oral ETT  Additional Equipment:   Intra-op Plan:   Post-operative Plan: Extubation in OR  Informed Consent: I have reviewed the patients History and Physical, chart, labs and discussed the procedure including the risks, benefits and alternatives for the proposed anesthesia with the patient or authorized representative who has indicated his/her understanding and acceptance.     Plan Discussed with: CRNA and Surgeon  Anesthesia Plan Comments:         Anesthesia Quick Evaluation

## 2013-10-17 NOTE — Anesthesia Postprocedure Evaluation (Signed)
  Anesthesia Post-op Note  Patient: [name] Wu  Procedure(s) Performed: Procedure(s) with comments: INSERTION PORT-A-CATH (Left) LAPAROSCOPIC GASTROJEJUNOSTOMY (N/A) - Dr. Clyda Greener card was used please make a preference card for Dr. Barry Dienes.  Patient Location: PACU  Anesthesia Type:General  Level of Consciousness: awake, alert  and oriented  Airway and Oxygen Therapy: Patient Spontanous Breathing  Post-op Pain: none  Post-op Assessment: Post-op Vital signs reviewed  Post-op Vital Signs: Reviewed  Last Vitals:  Filed Vitals:   10/17/13 1500  BP:   Pulse: 62  Temp:   Resp: 12    Complications: No apparent anesthesia complications

## 2013-10-17 NOTE — Progress Notes (Signed)
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 IMRT was chosen to decrease the risk for both acute and late toxicity compared to 3-D conformal or conventional radiation therapy. With IMRT we are better able to spare his left parotid gland and reduce the risk for permanent xerostomia. Dose volume histograms were obtained for the target structures including the base of tongue/tonsil PTV, and nodal PTV's. We also obtained dose volume histograms for avoidance structures including the spinal cord, brainstem, glottic larynx, parotid glands, mandible, and oral cavity. We met our departmental guidelines. I prescribing 7000 cGy in 35 sessions to his primary and gross total disease and 5950 cGy in 35 sessions to his high risk disease and 5600 cGy in 35 sessions to his low risk nodal disease. I requesting daily MV CT, setting up to his upper cervical spine. He will receive concomitant chemotherapy in this constitutes a special treatment procedure. This requires more intensive care because of the expected degree of mucositis and nutritional support. Subspecialty consultations have been made.

## 2013-10-17 NOTE — H&P (View-Only) (Signed)
Chief Complaint  Patient presents with  . Eval for [facility]    Referring MD: Gorsuch  HISTORY:  The patient is a 58 year old male who is referred by Dr. 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 He has need for adjuvant therapy and feeding tube placement.  He has not had any recent weight loss. He is not ever had difficulty with his airway. Has had prior abdominal surgery. He had a ruptured appendix and required 2 surgeries for this.  He does abdominal pain, nausea, or vomiting. He is currently having a small amount of difficulty eating.   Past Medical History  Diagnosis Date  . Sleep apnea     uses CPAP  . Tuberculosis 1984    INH x 81yrs  . Amnesia     after being hitting head playing baseball, lasted 1 night  . Malignant neoplasm of base of tongue 09/13/13    right base of tongue- inv squamous cell     Past Surgical History  Procedure Laterality Date  . Appendectomy  1956    at [facility]  . Finger surgery Right     knuckle removed  . Foot surgery Left     x2  . Tumor removal      left chest-bengin  . Vasectomy    . Direct laryngoscopy N/A 09/12/2013    Procedure: DIRECT LARYNGOSCOPY WITH BIOPSY ;  Surgeon: Izora Gala, MD;  Location: [facility];  Service: ENT;  Laterality: N/A;  . Esophagoscopy N/A 09/12/2013    Procedure: ESOPHAGOSCOPY;  Surgeon: Izora Gala, MD;  Location: [facility] OR;  Service: ENT;  Laterality: N/A;    Current Outpatient Prescriptions  Medication Sig Dispense Refill  . HYDROcodone-acetaminophen (NORCO) 7.5-325 MG per tablet Take 1 tablet by mouth every 6 (six) hours as needed for moderate pain.  80 tablet  0  . ibuprofen (ADVIL,MOTRIN) 200 MG tablet Take 600 mg by mouth every 6 (six) hours as needed for fever, headache or mild pain.      . traMADol (ULTRAM) 50 MG tablet Take 1 tablet (50 mg total) by mouth every 6 (six) hours as needed for moderate pain.  60 tablet  0  . promethazine (PHENERGAN) 25 MG suppository Place 1 suppository (25 mg  total) rectally every 6 (six) hours as needed for nausea or vomiting.  12 suppository  1   No current facility-administered medications for this visit.     Allergies  Allergen Reactions  . Codeine Nausea And Vomiting  . Velosef [Cephradine] Rash     Family History  Problem Relation Age of Onset  . Cancer Paternal Grandfather      History   Social History  . Marital Status: Married    Spouse Name: N/A    Number of Children: 9  . Years of Education: N/A   Occupational History  .  [employer]   Social History Main Topics  . Smoking status: Never Smoker   . Smokeless tobacco: Never Used  . Alcohol Use: No  . Drug Use: No  . Sexual Activity: None   Other Topics Concern  . None   Social History Narrative   09/24/2013   Patient is married.   Patient has 2 children from his previous marriage and 3 stepchildren from this married. Patient has been married to his current wife for approximately 2 years.   Patient has never smoked, never used smokeless tobacco, and indicates that he does not use illicit drugs or alcohol.  REVIEW OF SYSTEMS - PERTINENT POSITIVES ONLY: 12 point review of systems negative other than HPI and PMH except for sore throat, trouble swallowing.  EXAM: Filed Vitals:   09/30/13 1331  BP: 130/82  Pulse: 73  Temp: 98.7 F (37.1 C)    Wt Readings from Last 3 Encounters:  09/30/13 216 lb (97.977 kg)  09/24/13 214 lb (97.07 kg)  09/12/13 224 lb 11.2 oz (101.923 kg)     Gen:  No acute distress.  Well nourished and well groomed.   Neurological: Alert and oriented to person, place, and time. Coordination normal.  Head: Normocephalic and atraumatic.  Eyes: Conjunctivae are normal. Pupils are equal, round, and reactive to light. No scleral icterus.  Neck: Normal range of motion. Neck supple. No tracheal deviation or thyromegaly present.  Cardiovascular: Normal rate, regular rhythm, normal heart sounds and intact distal pulses.   Exam reveals no gallop and no friction rub.  No murmur heard. Respiratory: Effort normal.  No respiratory distress. No chest wall tenderness. Breath sounds normal.  No wheezes, rales or rhonchi.  GI: Soft. Bowel sounds are normal. The abdomen is soft and nontender.  There is no rebound and no guarding. prior RLQ scar and right paramedian infraumbilical scar. Musculoskeletal: Normal range of motion. Extremities are nontender.  Lymphadenopathy: No cervical, preauricular, postauricular or axillary adenopathy is present Skin: Skin is warm and dry. No rash noted. No diaphoresis. No erythema. No pallor. No clubbing, cyanosis, or edema.   Psychiatric: Normal mood and affect. Behavior is normal. Judgment and thought content normal.    LABORATORY RESULTS: Available labs are reviewed   Recent Results (from the past 2160 hour(s))  BASIC METABOLIC PANEL     Status: Abnormal   Collection Time    09/09/13  1:39 PM      Result Value Ref Range   Sodium 142  137 - 147 mEq/L   Potassium 4.9  3.7 - 5.3 mEq/L   Chloride 106  96 - 112 mEq/L   CO2 26  19 - 32 mEq/L   Glucose, Bld 91  70 - 99 mg/dL   BUN 21  6 - 23 mg/dL   Creatinine, Ser 1.41 (*) 0.50 - 1.35 mg/dL   Calcium 9.4  8.4 - 10.5 mg/dL   GFR calc non Af Amer 53 (*) >90 mL/min   GFR calc Af Amer 62 (*) >90 mL/min   Comment: (NOTE)     The eGFR has been calculated using the CKD EPI equation.     This calculation has not been validated in all clinical situations.     eGFR's persistently <90 mL/min signify possible Chronic Kidney     Disease.   Anion gap 10  5 - 15  CBC     Status: None   Collection Time    09/09/13  1:39 PM      Result Value Ref Range   WBC 8.9  4.0 - 10.5 K/uL   RBC 5.18  4.22 - 5.81 MIL/uL   Hemoglobin 16.3  13.0 - 17.0 g/dL   HCT 47.9  39.0 - 52.0 %   MCV 92.5  78.0 - 100.0 fL   MCH 31.5  26.0 - 34.0 pg   MCHC 34.0  30.0 - 36.0 g/dL   RDW 13.1  11.5 - 15.5 %   Platelets 269  150 - 400 K/uL  GLUCOSE, CAPILLARY      Status: Abnormal   Collection Time    09/25/13 12:41 PM  Result Value Ref Range   Glucose-Capillary 104 (*) 70 - 99 mg/dL  CBC WITH DIFFERENTIAL     Status: None   Collection Time    09/30/13  9:52 AM      Result Value Ref Range   WBC 7.6  4.0 - 10.3 10e3/uL   NEUT# 4.5  1.5 - 6.5 10e3/uL   HGB 14.8  13.0 - 17.1 g/dL   HCT 45.0  38.4 - 49.9 %   Platelets 300  140 - 400 10e3/uL   MCV 93.1  79.3 - 98.0 fL   MCH 30.6  27.2 - 33.4 pg   MCHC 32.9  32.0 - 36.0 g/dL   RBC 4.83  4.20 - 5.82 10e6/uL   RDW 12.9  11.0 - 14.6 %   lymph# 1.9  0.9 - 3.3 10e3/uL   MONO# 0.9  0.1 - 0.9 10e3/uL   Eosinophils Absolute 0.4  0.0 - 0.5 10e3/uL   Basophils Absolute 0.1  0.0 - 0.1 10e3/uL   NEUT% 58.6  39.0 - 75.0 %   LYMPH% 24.2  14.0 - 49.0 %   MONO% 11.3  0.0 - 14.0 %   EOS% 5.0  0.0 - 7.0 %   BASO% 0.9  0.0 - 2.0 %  COMPREHENSIVE METABOLIC PANEL (EA54)     Status: Abnormal   Collection Time    09/30/13  9:52 AM      Result Value Ref Range   Sodium 142  136 - 145 mEq/L   Potassium 4.3  3.5 - 5.1 mEq/L   Chloride 108  98 - 109 mEq/L   CO2 27  22 - 29 mEq/L   Glucose 102  70 - 140 mg/dl   BUN 18.7  7.0 - 26.0 mg/dL   Creatinine 1.4 (*) 0.7 - 1.3 mg/dL   Total Bilirubin 0.57  0.20 - 1.20 mg/dL   Alkaline Phosphatase 66  40 - 150 U/L   AST 15  5 - 34 U/L   ALT 15  0 - 55 U/L   Total Protein 6.7  6.4 - 8.3 g/dL   Albumin 3.5  3.5 - 5.0 g/dL   Calcium 9.2  8.4 - 10.4 mg/dL   Anion Gap 7  3 - 11 mEq/L     RADIOLOGY RESULTS: See E-Chart or I-Site for most recent results.  Images and reports are reviewed.  Dg Chest 2 View  09/09/2013   CLINICAL DATA:  Sleep apnea.  History of tuberculosis.  EXAM: CHEST  2 VIEW  COMPARISON:  Abdominal CT 02/03/2010.  FINDINGS: The heart size and mediastinal contours are normal. The lungs are clear. There is no pleural effusion or pneumothorax. No acute osseous findings are identified.  IMPRESSION: No active cardiopulmonary process.   Electronically Signed    By: Camie Patience M.D.   On: 09/09/2013 14:14   Ct Soft Tissue Neck W Contrast  09/06/2013   CLINICAL DATA:  Cervical adenopathy. Right-sided sore throat. A preop exam for biopsy.  EXAM: CT NECK WITH CONTRAST  TECHNIQUE: Multidetector CT imaging of the neck was performed using the standard protocol following the bolus administration of intravenous contrast.  CONTRAST:  80mL OMNIPAQUE IOHEXOL 300 MG/ML  SOLN  COMPARISON:  Cervical spine CT 07/07/2006  FINDINGS: A heterogeneously enhancing right level 2 lymph node measures 2.0 x 1.5 x 1.2 cm. A right level 3 node or nodal mass measures 3.0 x 1.6 x 2.3 cm. This may represent up to 3 separate adjacent nodes. No other pathologically enlarged  nodes are present in the neck. There is no significant left-sided adenopathy.  A base of tongue lesion is present on the right, measuring 2.0 x 3.1 x 4.2 cm. No other significant mucosal submucosal lesions are present.  The larynx is within normal limits. The vocal cords are midline and symmetric.  The thyroid is normal. The right peritracheal lymph node at the thoracic inlet measures 2.1 x 1.6 cm. No other significant mediastinal lymph nodes are present.  Multilevel degenerative changes are present within the cervical spine with endplate sclerotic changes and uncovertebral spurring at C3-4 through C6-7. Osseous foraminal narrowing is present on the left at C3-4 and C6-7 and on the right at C4-5. Advanced degenerative change first is a remote tip of the dens fracture is noted. The lung apices are clear.  IMPRESSION: 1. 2.0 x 3.1 x 4.2 cm right base of tongue enhancing mass lesion is most concerning for a squamous cell cancer. 2. Right level 2 and level 3 adenopathy as described. 3. Right peritracheal lymph node within the superior mediastinum. 4. Moderate spondylosis of the cervical spine.   Electronically Signed   By: Lawrence Santiago M.D.   On: 09/06/2013 13:44   Nm Pet Image Initial (pi) Skull Base To Thigh  09/25/2013    CLINICAL DATA:  Initial treatment strategy for base of tongue mass.  EXAM: NUCLEAR MEDICINE PET SKULL BASE TO THIGH  TECHNIQUE: 12.2 mCi F-18 FDG was injected intravenously. Full-ring PET imaging was performed from the skull base to thigh after the radiotracer. CT data was obtained and used for attenuation correction and anatomic localization.  FASTING BLOOD GLUCOSE:  Value: 104 mg/dl  COMPARISON:  None.  FINDINGS: NECK  Increased radiotracer uptake associated with the right base of tongue mass has an SUV max equal to 14.8. There are several enlarged and hypermetabolic right sided level-II lymph nodes. Index lymph node measures 1.8 cm and has an SUV max equal to 11.1. No contralateral hypermetabolic adenopathy.  CHEST  There is a 1.2 cm right paratracheal lymph node, image 57/series 4. This has an SUV max equal to 1.9. No suspicious pulmonary nodules on the CT scan.  ABDOMEN/PELVIS  No abnormal hypermetabolic activity within the liver, pancreas, adrenal glands, or spleen. No hypermetabolic lymph nodes in the abdomen or pelvis.  SKELETON  No focal hypermetabolic activity to suggest skeletal metastasis.  IMPRESSION: 1. Right base of tongue mass exhibits intense FDG uptake compatible with primary head neck carcinoma. 2. Hypermetabolic right level to adenopathy. 3. Right paratracheal lymph node is abnormal by size criteria but does not exhibit significant malignant range FDG uptake. This is equivocal for lymph node metastasis.   Electronically Signed   By: Kerby Moors M.D.   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    Reviewed port and g tube surgery, risks, benefits.   Discussed risks of bleeding, infection, damage to adjacent structures, malfunction, need for additional procedures, and other.  We will do this at the first available opportunity.  His mass is not visible anteriorly.   Anesthesia may need to evaluate his airway up front.       Milus Height MD Surgical Oncology, General [facility]      Visit Diagnoses: 1. 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

## 2013-10-17 NOTE — Transfer of Care (Signed)
Immediate Anesthesia Transfer of Care Note  Patient: [name] Wu  Procedure(s) Performed: Procedure(s) with comments: INSERTION PORT-A-CATH (Left) LAPAROSCOPIC GASTROJEJUNOSTOMY (N/A) - Dr. Clyda Greener card was used please make a preference card for Dr. Barry Dienes.  Patient Location: PACU  Anesthesia Type:General  Level of Consciousness: awake and alert   Airway & Oxygen Therapy: Patient Spontanous Breathing and Patient connected to face mask oxygen  Post-op Assessment: Report given to PACU RN and Post -op Vital signs reviewed and stable  Post vital signs: Reviewed and stable  Complications: No apparent anesthesia complications

## 2013-10-18 ENCOUNTER — Other Ambulatory Visit: Payer: Self-pay

## 2013-10-18 DIAGNOSIS — Z510 Encounter for antineoplastic radiation therapy: Secondary | ICD-10-CM | POA: Diagnosis not present

## 2013-10-18 DIAGNOSIS — C01 Malignant neoplasm of base of tongue: Secondary | ICD-10-CM | POA: Diagnosis not present

## 2013-10-18 MED ORDER — HYDROCODONE-ACETAMINOPHEN 7.5-325 MG PO TABS: 1.0000 | ORAL_TABLET | ORAL | Status: DC | PRN

## 2013-10-18 NOTE — Progress Notes (Signed)
Nutrition Brief Note  Pt admitted for PEG placement. Placed 9/10. No plans to start TF at this time. PEG placed in anticipation of starting cancer treatments.  Reviewed importance of flushing. Reviewed how to give feedings. Pt will be working with the RD at [facility] to determine when to start feedings and feeding goals. Family expressed interest in making their own PEG feedings. We discussed food safety and hand hygiene. No further questions at this time.  Will refer to RD at [facility].  [place], [place], [place] Pager [phone] After Hours Pager

## 2013-10-18 NOTE — Discharge Summary (Signed)
Physician Discharge Summary  Patient ID: [name] Wu MRN: [id number] DOB/AGE: [birth year] 58 y.o.  Admit date: 10/17/2013 Discharge date: 10/18/2013  Admission Diagnoses:  Discharge Diagnoses:  Active Problems:   Tongue cancer   Discharged Condition: stable  Hospital Course:  Pt was admitted to the hospital after undergoing a lap g tube and port a cath.  He did well overnight.  He was able to tolerate liquids.  He tolerated oral pain medication and was able to void and ambulate independently.  Nutrition consult and g tube teaching was ordered.  He will be discharged to home in stable condition.    Consults: nutrition consult  Significant Diagnostic Studies: labs: HCT stable.  Treatments: IVF, pain control  Discharge Exam: Blood pressure 122/75, pulse 92, temperature 97.8 F (36.6 C), temperature source Oral, resp. rate 18, height 5\' 5"  (1.651 m), weight 215 lb (97.523 kg), SpO2 99.00%. General appearance: alert, cooperative and no distress Chest wall: left sided chest wall tenderness, at port site Cardio: regular rate and rhythm GI: soft, approp tender, non distended, G tube in place.  Disposition: 06-Home-Health Care Svc  Discharge Instructions   Call MD for:  persistant nausea and vomiting    Complete by:  As directed      Call MD for:  redness, tenderness, or signs of infection (pain, swelling, redness, odor or green/yellow discharge around incision site)    Complete by:  As directed      Call MD for:  severe uncontrolled pain    Complete by:  As directed      Call MD for:  temperature >100.4    Complete by:  As directed      Diet - low sodium heart healthy    Complete by:  As directed      Increase activity slowly    Complete by:  As directed             Medication List         HYDROcodone-acetaminophen 7.5-325 MG per tablet  Commonly known as:  NORCO  Take 1-2 tablets by mouth every 4 (four) hours as needed for moderate pain.     lidocaine-prilocaine  cream  Commonly known as:  EMLA  Apply 1 application topically as needed (per port-a-cath).     ondansetron 8 MG tablet  Commonly known as:  ZOFRAN  Take 8 mg by mouth every 8 (eight) hours as needed for nausea or vomiting.     prochlorperazine 10 MG tablet  Commonly known as:  COMPAZINE  Take 10 mg by mouth every 6 (six) hours as needed for nausea or vomiting.     promethazine 25 MG suppository  Commonly known as:  PHENERGAN  Place 25 mg rectally every 6 (six) hours as needed for nausea or vomiting.     sodium fluoride 1.1 % Gel dental gel  Commonly known as:  FLUORISHIELD  Place 1 application onto teeth at bedtime.     traMADol 50 MG tablet  Commonly known as:  ULTRAM  Take 50 mg by mouth every 6 (six) hours as needed for moderate pain.           Follow-up Information   Follow up with [facility], MD In 2 weeks.   Specialty:  General Surgery   Contact information:   [address] [place] [address] [phone]       Signed: Stark Klein 10/18/2013, 6:18 AM

## 2013-10-18 NOTE — Progress Notes (Signed)
Pt. Discharge to home. Discharge instructions given to patient and wife.  Materials for [employer] given to patient. No question verbalized.

## 2013-10-21 ENCOUNTER — Encounter: Payer: Self-pay | Admitting: *Deleted

## 2013-10-21 ENCOUNTER — Ambulatory Visit: Payer: BC Managed Care – PPO | Admitting: Nutrition

## 2013-10-21 ENCOUNTER — Ambulatory Visit: Payer: BC Managed Care – PPO | Admitting: Radiation Oncology

## 2013-10-21 ENCOUNTER — Other Ambulatory Visit: Payer: Self-pay | Admitting: Hematology and Oncology

## 2013-10-21 ENCOUNTER — Encounter (HOSPITAL_COMMUNITY): Payer: Self-pay | Admitting: General Surgery

## 2013-10-21 ENCOUNTER — Ambulatory Visit (HOSPITAL_BASED_OUTPATIENT_CLINIC_OR_DEPARTMENT_OTHER): Payer: BC Managed Care – PPO

## 2013-10-21 ENCOUNTER — Encounter: Payer: Self-pay | Admitting: Radiation Oncology

## 2013-10-21 VITALS — BP 130/86 | HR 76 | Temp 98.6°F | Resp 17

## 2013-10-21 DIAGNOSIS — Z5111 Encounter for antineoplastic chemotherapy: Secondary | ICD-10-CM

## 2013-10-21 DIAGNOSIS — C01 Malignant neoplasm of base of tongue: Secondary | ICD-10-CM

## 2013-10-21 MED ORDER — DEXAMETHASONE SODIUM PHOSPHATE 20 MG/5ML IJ SOLN
12.0000 mg | Freq: Once | INTRAMUSCULAR | Status: AC
  Administered 2013-10-21: 12 mg via INTRAVENOUS

## 2013-10-21 MED ORDER — POTASSIUM CHLORIDE 2 MEQ/ML IV SOLN
Freq: Once | INTRAVENOUS | Status: AC
  Administered 2013-10-21: 13:00:00 via INTRAVENOUS
  Filled 2013-10-21: qty 10

## 2013-10-21 MED ORDER — CISPLATIN CHEMO INJECTION 100MG/100ML
100.0000 mg/m2 | Freq: Once | INTRAVENOUS | Status: AC
  Administered 2013-10-21: 212 mg via INTRAVENOUS
  Filled 2013-10-21: qty 212

## 2013-10-21 MED ORDER — PALONOSETRON HCL INJECTION 0.25 MG/5ML
INTRAVENOUS | Status: AC
  Filled 2013-10-21: qty 5

## 2013-10-21 MED ORDER — SODIUM CHLORIDE 0.9 % IV SOLN
150.0000 mg | Freq: Once | INTRAVENOUS | Status: AC
  Administered 2013-10-21: 150 mg via INTRAVENOUS
  Filled 2013-10-21: qty 5

## 2013-10-21 MED ORDER — PALONOSETRON HCL INJECTION 0.25 MG/5ML
0.2500 mg | Freq: Once | INTRAVENOUS | Status: AC
  Administered 2013-10-21: 0.25 mg via INTRAVENOUS

## 2013-10-21 MED ORDER — DEXAMETHASONE SODIUM PHOSPHATE 20 MG/5ML IJ SOLN
INTRAMUSCULAR | Status: AC
  Filled 2013-10-21: qty 5

## 2013-10-21 NOTE — Progress Notes (Signed)
1200 Patient's PAC not flushing freely and with no positive blood return after having patient try several different positions and two other nurses assessed as well. Informed Dr. Alvy Bimler, per MD, she notified Dr. Marlowe Aschoff office. L. Forearm PIV started per patient ok. PAC deaccessed and Patient knows to expect call from Dr. Marlowe Aschoff office regarding reassessing of PAC placement.

## 2013-10-21 NOTE — Progress Notes (Signed)
Nutrition followup with patient and wife during first chemotherapy today.  Weight is stable and documented as 215 pounds.  No nutrition issues at this time.  Patient status post feeding tube.  He has had some difficulty flushing the feeding tube but it has been easier the past few days.  Nutrition diagnosis: Predicted suboptimal energy intake continues.  Intervention: Patient educated to continue adequate calories and protein in small, frequent meals and snacks. Provided strategies for easier flushing of the feeding tube. Questions were answered and teach back method used.  Monitoring, evaluation, goals: Patient is tolerating adequate calories and protein, and has had weight maintenance.  Next visit: Tuesday, September 22, after radiation therapy.  **Disclaimer: This note was dictated with voice recognition software. Similar sounding words can inadvertently be transcribed and this note may contain transcription errors which may not have been corrected upon publication of note.**

## 2013-10-21 NOTE — Progress Notes (Signed)
CC: Dr. Jorja Loa   Chart note: [name] received his chemotherapy earlier today. On examination he has areas of exposed bone along the posterior left and right mandibular extraction sites. I will need to have Dr. Enrique Sack follow him and let me know and begin his radiation therapy.

## 2013-10-21 NOTE — Patient Instructions (Signed)
[facility] Discharge Instructions for Patients Receiving Chemotherapy  Today you received the following chemotherapy agent Cisplatin.  To help prevent nausea and vomiting after your treatment, we encourage you to take your nausea medication as prescribed by your physician.   If you develop nausea and vomiting that is not controlled by your nausea medication, call the clinic.   BELOW ARE SYMPTOMS THAT SHOULD BE REPORTED IMMEDIATELY:  *FEVER GREATER THAN 100.5 F  *CHILLS WITH OR WITHOUT FEVER  NAUSEA AND VOMITING THAT IS NOT CONTROLLED WITH YOUR NAUSEA MEDICATION  *UNUSUAL SHORTNESS OF BREATH  *UNUSUAL BRUISING OR BLEEDING  TENDERNESS IN MOUTH AND THROAT WITH OR WITHOUT PRESENCE OF ULCERS  *URINARY PROBLEMS  *BOWEL PROBLEMS  UNUSUAL RASH Items with * indicate a potential emergency and should be followed up as soon as possible.  Feel free to call the clinic you have any questions or concerns. The clinic phone number is [phone].

## 2013-10-22 ENCOUNTER — Ambulatory Visit: Payer: BC Managed Care – PPO

## 2013-10-22 ENCOUNTER — Telehealth: Payer: Self-pay | Admitting: *Deleted

## 2013-10-22 NOTE — Telephone Encounter (Signed)
2nd VM left at home # for patient to call office.

## 2013-10-22 NOTE — Telephone Encounter (Signed)
Left VM requesting patient to call triage and ask for nurse Manuela Schwartz in regards to his current status.

## 2013-10-23 ENCOUNTER — Telehealth: Payer: Self-pay | Admitting: *Deleted

## 2013-10-23 ENCOUNTER — Ambulatory Visit: Payer: BC Managed Care – PPO

## 2013-10-23 NOTE — Telephone Encounter (Signed)
Mild nausea resolved with antiemetics. Drinking fluids well. Some fatigue. Wife inquired about referral back to surgeon re: port. She adds that "they" have not been pleased with Dr. Marlowe Aschoff work and that "his feeding tube isn't acting right either". Made her aware I will follow up with surgeon office. Called Dr. Marlowe Aschoff nurse, Anderson Malta: made her aware of PAC malfunction and wife's concern about feeding tube as well as her anxiety regarding particular surgeon. She will follow up and contact patient.

## 2013-10-24 ENCOUNTER — Other Ambulatory Visit (INDEPENDENT_AMBULATORY_CARE_PROVIDER_SITE_OTHER): Payer: Self-pay | Admitting: *Deleted

## 2013-10-24 ENCOUNTER — Ambulatory Visit: Payer: BC Managed Care – PPO

## 2013-10-24 DIAGNOSIS — C029 Malignant neoplasm of tongue, unspecified: Secondary | ICD-10-CM

## 2013-10-25 ENCOUNTER — Ambulatory Visit: Admit: 2013-10-25 | Payer: Self-pay | Admitting: General Surgery

## 2013-10-25 ENCOUNTER — Ambulatory Visit: Payer: BC Managed Care – PPO

## 2013-10-25 ENCOUNTER — Ambulatory Visit (HOSPITAL_COMMUNITY)
Admission: RE | Admit: 2013-10-25 | Discharge: 2013-10-25 | Disposition: A | Discharge status: Home / Self Care | Payer: BC Managed Care – PPO | Source: Ambulatory Visit | Attending: General Surgery | Admitting: General Surgery

## 2013-10-25 DIAGNOSIS — Z452 Encounter for adjustment and management of vascular access device: Secondary | ICD-10-CM | POA: Insufficient documentation

## 2013-10-25 DIAGNOSIS — C029 Malignant neoplasm of tongue, unspecified: Secondary | ICD-10-CM

## 2013-10-25 DIAGNOSIS — C760 Malignant neoplasm of head, face and neck: Secondary | ICD-10-CM | POA: Diagnosis not present

## 2013-10-25 SURGERY — PORT A CATH REVISION
Anesthesia: General

## 2013-10-25 MED ORDER — IOHEXOL 300 MG/ML  SOLN
10.0000 mL | Freq: Once | INTRAMUSCULAR | Status: AC | PRN
  Administered 2013-10-25: 10 mL via INTRAVENOUS

## 2013-10-25 MED ORDER — HEPARIN SOD (PORK) LOCK FLUSH 100 UNIT/ML IV SOLN
500.0000 [IU] | Freq: Once | INTRAVENOUS | Status: AC
  Administered 2013-10-25: 500 [IU] via INTRAVENOUS

## 2013-10-25 NOTE — Progress Notes (Signed)
To provide support and encouragement, care continuity and to assess for needs, met with patient during his first chemo infusion.  He did not express any needs or concerns at this time, I encouraged him to contact me if that changes before I see him next, he verbalized agreement.  Rick Diehl, RN, BSN, CHPN Head & Neck Oncology Navigator  [facility] [phone]  

## 2013-10-28 ENCOUNTER — Encounter: Payer: Self-pay | Admitting: Radiation Oncology

## 2013-10-28 ENCOUNTER — Ambulatory Visit (HOSPITAL_COMMUNITY): Payer: Medicaid - Dental | Admitting: Dentistry

## 2013-10-28 ENCOUNTER — Ambulatory Visit: Payer: Self-pay | Admitting: Hematology and Oncology

## 2013-10-28 ENCOUNTER — Ambulatory Visit: Payer: BC Managed Care – PPO

## 2013-10-28 ENCOUNTER — Other Ambulatory Visit: Payer: Self-pay

## 2013-10-28 ENCOUNTER — Encounter (HOSPITAL_COMMUNITY): Payer: Self-pay | Admitting: Dentistry

## 2013-10-28 VITALS — BP 104/71 | HR 80 | Temp 98.1°F

## 2013-10-28 DIAGNOSIS — K08109 Complete loss of teeth, unspecified cause, unspecified class: Secondary | ICD-10-CM

## 2013-10-28 DIAGNOSIS — M270 Developmental disorders of jaws: Secondary | ICD-10-CM

## 2013-10-28 DIAGNOSIS — Z0189 Encounter for other specified special examinations: Secondary | ICD-10-CM

## 2013-10-28 DIAGNOSIS — C01 Malignant neoplasm of base of tongue: Secondary | ICD-10-CM

## 2013-10-28 DIAGNOSIS — M278 Other specified diseases of jaws: Secondary | ICD-10-CM

## 2013-10-28 DIAGNOSIS — K08409 Partial loss of teeth, unspecified cause, unspecified class: Secondary | ICD-10-CM

## 2013-10-28 NOTE — Patient Instructions (Signed)
Plan: 1. Patient is being referred back to [facility]ill[facility] surgeons for evaluation of healing and to determine if the patient can start radiation therapy at this time.  Dr. Corene Cornea Mohorn is scheduled to evaluate patient today and Dr. Caesar Bookman absence.      Dr. Loyal Gambler to contact Dr. Arloa Koh [phone]) discussed start of radiation therapy after his evaluation today.  2. Brush teeth after meals and at bedtime. Floss at bedtime. 3. Use fluoride in trays at bedtime as instructed. 4. Use trismus device and exercises as instructed daily 5. Continue salt water rinses as needed to aid healing for the dental extractions 7. Patient to be cleared for radiation therapy by oral surgeon.  8. Patient to return to clinic for periodic oral examination in approximately 2-3 weeks during radiation therapy. 9. Patient to call if questions or problems arise before then.  Lenn Cal, DDS

## 2013-10-28 NOTE — Progress Notes (Signed)
CC: Dr. Jorja Loa, Gayleen Orem, RN, Dr. Heath Lark  Chart note: Dr. Landry Corporal office called me today, and wanted [name] Wu to wait at least 4 more days before beginning his radiation therapy. He has not completely healed a molar dental extraction site. Therefore, we will get him scheduled to begin his radiation therapy on Monday, September 28.

## 2013-10-28 NOTE — Progress Notes (Signed)
10/28/2013  Patient:            [name] Wu Date of Birth:  1956-01-04 MRN:                [id number]  BP 104/71  Pulse 80  Temp(Src) 98.1 F (36.7 C)  [name] Wu now presents for reevaluation of healing after additional surgery by Dr. Lewanda Rife involving the lower right and lower left exposed mandible on 10/23/2013. The patient recently had tooth numbers 18, 30, and 31 extracted with alveoloplasty by Dr. Lewanda Rife on 8//27/15.  Patient refused to proceed with bilateral mandibular tori reductions at the time of the surgery. Patient was seen for insertion of fluoride trays and scatter protection devices on 10/08/2013. No exposed bone was noted at that time. However, some prominent bone was palpated on the lingual aspect of the lower right mandible. Patient was reevaluated by Dr. Valere Dross for start of radiation therapy on 10/21/2013. Exposed bone was noted bilaterally at that time.  Patient was then referred to be seen by Dr. Lewanda Rife who performed some alveoloplasty to the exposed bone by patient report on 10/23/2013.  The patient was not scheduled for follow by Dr. Lewanda Rife. The patient now presents for reevaluation of healing at the request of Dr. Valere Dross. Dr. Valere Dross would like to start radiation therapy tomorrow if possible.  SUBJECTIVE: Patient denies having any significant oral discomfort from the surgery. Patient indicates that Dr. Lewanda Rife performed some reshaping of the bone on the 10/23/2013 appointment.  OBJECTIVE: There is generalized irritation to the tissues of the lower right lingual aspect of the alveolar bone in the extraction site #30-31. Two 1 mm size areas of exposed bone remain any other areas of the exposed bone appear to be healing in by secondary intention. There is no evidence of exposed bone on the lower left lingual. The patient has Bilateral mandibular tori and prominent lingual alveolar ridge bone noted.   ASSESSMENT: 1. Loss of teeth secondary to extraction as part  of a preradiation therapy dental protocol 2. Persistent exposed bone of the lower right lingual area numbers #30-31.  Plan: 1. Patient is being referred back to [facility]ill[facility] surgeons for evaluation of healing and to determine if the patient can start radiation therapy at this time.  Dr. Corene Cornea Mohorn is scheduled to evaluate patient today and Dr. Caesar Bookman absence.      Dr. Loyal Gambler to contact Dr. Arloa Koh [phone]) discussed start of radiation therapy after his evaluation today.  2. Brush teeth after meals and at bedtime. Floss at bedtime. 3. Use fluoride in trays at bedtime as instructed. 4. Use trismus device and exercises as instructed daily 5. Continue salt water rinses as needed to aid healing for the dental extractions 7. Patient to be cleared for radiation therapy by oral surgeon.  8. Patient to return to clinic for periodic oral examination in approximately 2-3 weeks during radiation therapy. 9. Patient to call if questions or problems arise before then.  Lenn Cal, DDS

## 2013-10-29 ENCOUNTER — Telehealth: Payer: Self-pay

## 2013-10-29 ENCOUNTER — Ambulatory Visit: Payer: BC Managed Care – PPO

## 2013-10-29 ENCOUNTER — Encounter: Payer: Self-pay | Admitting: Nutrition

## 2013-10-29 NOTE — Telephone Encounter (Signed)
Supply order faxed to [facility] medical supply at [phone].

## 2013-10-29 NOTE — Progress Notes (Signed)
Patient did not show up for nutrition appointment. 

## 2013-10-30 ENCOUNTER — Encounter: Payer: Self-pay | Admitting: *Deleted

## 2013-10-30 ENCOUNTER — Encounter: Payer: Self-pay | Admitting: Hematology and Oncology

## 2013-10-30 ENCOUNTER — Telehealth: Payer: Self-pay | Admitting: Hematology and Oncology

## 2013-10-30 ENCOUNTER — Ambulatory Visit (HOSPITAL_BASED_OUTPATIENT_CLINIC_OR_DEPARTMENT_OTHER): Payer: BC Managed Care – PPO

## 2013-10-30 ENCOUNTER — Other Ambulatory Visit (HOSPITAL_BASED_OUTPATIENT_CLINIC_OR_DEPARTMENT_OTHER): Payer: BC Managed Care – PPO

## 2013-10-30 ENCOUNTER — Telehealth: Payer: Self-pay | Admitting: *Deleted

## 2013-10-30 ENCOUNTER — Ambulatory Visit: Payer: BC Managed Care – PPO

## 2013-10-30 ENCOUNTER — Ambulatory Visit (HOSPITAL_BASED_OUTPATIENT_CLINIC_OR_DEPARTMENT_OTHER): Payer: BC Managed Care – PPO | Admitting: Hematology and Oncology

## 2013-10-30 VITALS — BP 123/78 | HR 75 | Temp 97.8°F | Resp 18 | Ht 65.0 in | Wt 205.3 lb

## 2013-10-30 VITALS — BP 107/75 | HR 66

## 2013-10-30 DIAGNOSIS — Z931 Gastrostomy status: Secondary | ICD-10-CM | POA: Insufficient documentation

## 2013-10-30 DIAGNOSIS — N179 Acute kidney failure, unspecified: Secondary | ICD-10-CM

## 2013-10-30 DIAGNOSIS — C01 Malignant neoplasm of base of tongue: Secondary | ICD-10-CM

## 2013-10-30 DIAGNOSIS — B977 Papillomavirus as the cause of diseases classified elsewhere: Secondary | ICD-10-CM

## 2013-10-30 DIAGNOSIS — R799 Abnormal finding of blood chemistry, unspecified: Secondary | ICD-10-CM

## 2013-10-30 DIAGNOSIS — R1310 Dysphagia, unspecified: Secondary | ICD-10-CM

## 2013-10-30 DIAGNOSIS — R7989 Other specified abnormal findings of blood chemistry: Secondary | ICD-10-CM

## 2013-10-30 DIAGNOSIS — E43 Unspecified severe protein-calorie malnutrition: Secondary | ICD-10-CM

## 2013-10-30 DIAGNOSIS — E46 Unspecified protein-calorie malnutrition: Secondary | ICD-10-CM | POA: Insufficient documentation

## 2013-10-30 LAB — CBC WITH DIFFERENTIAL/PLATELET
BASO%: 0.4 % (ref 0.0–2.0)
Basophils Absolute: 0 10*3/uL (ref 0.0–0.1)
EOS%: 1.5 % (ref 0.0–7.0)
Eosinophils Absolute: 0.2 10*3/uL (ref 0.0–0.5)
HCT: 43.4 % (ref 38.4–49.9)
HGB: 14.2 g/dL (ref 13.0–17.1)
LYMPH%: 15.3 % (ref 14.0–49.0)
MCH: 30.1 pg (ref 27.2–33.4)
MCHC: 32.7 g/dL (ref 32.0–36.0)
MCV: 92.1 fL (ref 79.3–98.0)
MONO#: 1.6 10*3/uL — ABNORMAL HIGH (ref 0.1–0.9)
MONO%: 13.1 % (ref 0.0–14.0)
NEUT#: 8.5 10*3/uL — ABNORMAL HIGH (ref 1.5–6.5)
NEUT%: 69.7 % (ref 39.0–75.0)
Platelets: 219 10*3/uL (ref 140–400)
RBC: 4.71 10*6/uL (ref 4.20–5.82)
RDW: 13 % (ref 11.0–14.6)
WBC: 12.2 10*3/uL — ABNORMAL HIGH (ref 4.0–10.3)
lymph#: 1.9 10*3/uL (ref 0.9–3.3)

## 2013-10-30 LAB — COMPREHENSIVE METABOLIC PANEL (CC13)
ALT: 18 U/L (ref 0–55)
AST: 13 U/L (ref 5–34)
Albumin: 3.2 g/dL — ABNORMAL LOW (ref 3.5–5.0)
Alkaline Phosphatase: 70 U/L (ref 40–150)
Anion Gap: 7 mEq/L (ref 3–11)
BUN: 27.7 mg/dL — ABNORMAL HIGH (ref 7.0–26.0)
CO2: 30 mEq/L — ABNORMAL HIGH (ref 22–29)
Calcium: 9.5 mg/dL (ref 8.4–10.4)
Chloride: 101 mEq/L (ref 98–109)
Creatinine: 1.6 mg/dL — ABNORMAL HIGH (ref 0.7–1.3)
Glucose: 111 mg/dl (ref 70–140)
Potassium: 4 mEq/L (ref 3.5–5.1)
Sodium: 139 mEq/L (ref 136–145)
Total Bilirubin: 0.45 mg/dL (ref 0.20–1.20)
Total Protein: 7.1 g/dL (ref 6.4–8.3)

## 2013-10-30 MED ORDER — SODIUM CHLORIDE 0.9 % IV SOLN
Freq: Once | INTRAVENOUS | Status: AC
  Administered 2013-10-30: 12:00:00 via INTRAVENOUS

## 2013-10-30 NOTE — Telephone Encounter (Signed)
Pt confirmed labs/ov per 09/23 POF, sent msg to add chemo,gave pt AVS....KJ °

## 2013-10-30 NOTE — Progress Notes (Signed)
To provide support and encouragement, care continuity and to assess for needs, met with patient and his wife in Infusion while he was receiving IVF.  He reported that he is eating and drinking at baseline though he verbalized understanding he needs to increase fluid intake as much as possible.  He did not express any needs or concerns at this time, I encouraged him to contact me if that changes before I see him next, he verbalized agreement.  Continuing navigation as L1 patient (new patient).  Gayleen Orem, RN, BSN, [place] [facility] [place] [phone]

## 2013-10-30 NOTE — Telephone Encounter (Signed)
s.w. pt and advise don monday added lab...pt ok and aware

## 2013-10-30 NOTE — Assessment & Plan Note (Signed)
He tolerated treatment well apart from recent fatigue and reduced oral intake. Unfortunately, blood work today show acute renal failure. The patient is encouraged to increase oral fluid intake. I will continue to see him on a weekly basis for aggressive supportive care.

## 2013-10-30 NOTE — Progress Notes (Signed)
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 There are right level 2 and level 3 adenopathy as described and right peritracheal lymph node within the superior mediastinum   09/12/2013 Surgery Laryngoscopy showed the oropharynx revealed a firm friable mass involving the lateral base of tongue and inferior tonsillar fossa.    09/13/2013 Pathology Results Accession: [id number] biopsy confirmed squamous cell carcinoma, HPV positive   09/25/2013 Imaging PET scan showed right base of tongue mass exhibits intense FDG uptake compatible with primary head neck carcinoma with associated hypermetabolic right level to adenopathy   10/03/2013 Procedure He had dental extraction   10/21/2013 -  Chemotherapy He start on high dose cisplatin    INTERVAL HISTORY: Please see below for problem oriented charting. He is seen as part of his weekly supportive care. He denies throat pain. Last week, he has significant fatigue and nausea for 3 days, resolved. He has lost 10 pounds of weight.  REVIEW OF SYSTEMS:   Constitutional: Denies fevers, chills  Eyes: Denies blurriness of vision Ears, nose, mouth, throat, and face: Denies mucositis or sore throat Respiratory: Denies cough,  dyspnea or wheezes Cardiovascular: Denies palpitation, chest discomfort or lower extremity swelling Skin: Denies abnormal skin rashes Lymphatics: Denies new lymphadenopathy or easy bruising Neurological:Denies numbness, tingling or new weaknesses Behavioral/Psych: Mood is stable, no new changes  All other systems were reviewed with the patient and are negative.  I have reviewed the past medical history, past surgical history, social history and family history with the patient and they are unchanged from previous note.  ALLERGIES:  is allergic to codeine and velosef.  MEDICATIONS:  Current Outpatient Prescriptions  Medication Sig Dispense Refill  . HYDROcodone-acetaminophen (NORCO) 7.5-325 MG per tablet Take 1-2 tablets by mouth every 4 (four) hours as needed for moderate pain.  50 tablet  0  . lidocaine-prilocaine (EMLA) cream Apply 1 application topically as needed (per port-a-cath).      . ondansetron (ZOFRAN) 8 MG tablet Take 8 mg by mouth every 8 (eight) hours as needed for nausea or vomiting.      . prochlorperazine (COMPAZINE) 10 MG tablet Take 10 mg by mouth every 6 (six) hours as needed for nausea or vomiting.      . promethazine (PHENERGAN) 25 MG suppository Place 25 mg rectally every 6 (six) hours as needed for nausea or vomiting.      . sodium fluoride (FLUORISHIELD) 1.1 % GEL dental gel Place 1 application onto teeth at bedtime.      . traMADol (ULTRAM) 50 MG tablet Take 50 mg by mouth every 6 (six) hours as needed for moderate pain.       No current facility-administered medications for this visit.  PHYSICAL EXAMINATION: ECOG PERFORMANCE STATUS: 0 - Asymptomatic  Filed Vitals:   10/30/13 1006  BP: 123/78  Pulse: 75  Temp: 97.8 F (36.6 C)  Resp: 18   Filed Weights   10/30/13 1006  Weight: 205 lb 4.8 oz (93.123 kg)    GENERAL:alert, no distress and comfortable. He is morbidly obese SKIN: skin color, texture, turgor are normal, no rashes or significant  lesions EYES: normal, Conjunctiva are pink and non-injected, sclera clear OROPHARYNX:no exudate, no erythema and lips, buccal mucosa, and tongue normal . Mild mucositis on his oropharynx NECK: supple, thyroid normal size, non-tender, without nodularity LYMPH:  Previously palpable lymphadenopathy has regressed in size.  LUNGS: clear to auscultation and percussion with normal breathing effort HEART: regular rate & rhythm and no murmurs and no lower extremity edema. Feeding tube site looks okay ABDOMEN:abdomen soft, non-tender and normal bowel sounds Musculoskeletal:no cyanosis of digits and no clubbing  NEURO: alert & oriented x 3 with fluent speech, no focal motor/sensory deficits  LABORATORY DATA:  I have reviewed the data as listed    Component Value Date/Time   NA 139 10/30/2013 0957   NA 139 10/16/2013 1257   K 4.0 10/30/2013 0957   K 4.4 10/16/2013 1257   CL 102 10/16/2013 1257   CO2 30* 10/30/2013 0957   CO2 24 10/16/2013 1257   GLUCOSE 111 10/30/2013 0957   GLUCOSE 98 10/16/2013 1257   BUN 27.7* 10/30/2013 0957   BUN 14 10/16/2013 1257   CREATININE 1.6* 10/30/2013 0957   CREATININE 1.12 10/17/2013 1857   CALCIUM 9.5 10/30/2013 0957   CALCIUM 9.3 10/16/2013 1257   PROT 7.1 10/30/2013 0957   ALBUMIN 3.2* 10/30/2013 0957   AST 13 10/30/2013 0957   ALT 18 10/30/2013 0957   ALKPHOS 70 10/30/2013 0957   BILITOT 0.45 10/30/2013 0957   GFRNONAA 71* 10/17/2013 1857   GFRAA 82* 10/17/2013 1857    No results found for this basename: SPEP, UPEP,  kappa and lambda light chains    Lab Results  Component Value Date   WBC 12.2* 10/30/2013   NEUTROABS 8.5* 10/30/2013   HGB 14.2 10/30/2013   HCT 43.4 10/30/2013   MCV 92.1 10/30/2013   PLT 219 10/30/2013      Chemistry      Component Value Date/Time   NA 139 10/30/2013 0957   NA 139 10/16/2013 1257   K 4.0 10/30/2013 0957   K 4.4 10/16/2013 1257   CL 102 10/16/2013 1257   CO2 30* 10/30/2013 0957   CO2 24 10/16/2013 1257   BUN 27.7* 10/30/2013 0957   BUN 14  10/16/2013 1257   CREATININE 1.6* 10/30/2013 0957   CREATININE 1.12 10/17/2013 1857      Component Value Date/Time   CALCIUM 9.5 10/30/2013 0957   CALCIUM 9.3 10/16/2013 1257   ALKPHOS 70 10/30/2013 0957   AST 13 10/30/2013 0957   ALT 18 10/30/2013 0957   BILITOT 0.45 10/30/2013 0957     ASSESSMENT & PLAN:  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 Unfortunately, blood work today show acute renal failure. The patient is encouraged to increase oral fluid intake. I will continue to see him on a weekly basis for aggressive supportive care.  Elevated serum creatinine This is due to recent dehydration and chemotherapy. I encourage increase oral fluid intake. I will bring him in every day for IV fluid resuscitation.  S/P gastrostomy The  feeding tube site looks okay with no signs of infection.  Protein calorie malnutrition I recommend he increase oral food intake. Otherwise, I recommend him to start using his feeding tube for calorie intake.      All questions were answered. The patient knows to call the clinic with any problems, questions or concerns. No barriers to learning was detected. I spent 30 minutes counseling the patient face to face. The total time spent in the appointment was 40 minutes and more than 50% was on counseling and review of test results     [facility], [place], MD 10/30/2013 3:48 PM

## 2013-10-30 NOTE — Patient Instructions (Signed)

## 2013-10-30 NOTE — Assessment & Plan Note (Signed)
I recommend he increase oral food intake. Otherwise, I recommend him to start using his feeding tube for calorie intake.

## 2013-10-30 NOTE — Assessment & Plan Note (Signed)
The feeding tube site looks okay with no signs of infection.

## 2013-10-30 NOTE — Telephone Encounter (Signed)
Per staff message from desk RN I have scheduled apapts. Patient notified.

## 2013-10-30 NOTE — Assessment & Plan Note (Signed)
This is due to recent dehydration and chemotherapy. I encourage increase oral fluid intake. I will bring him in every day for IV fluid resuscitation.

## 2013-10-31 ENCOUNTER — Encounter: Payer: Self-pay | Admitting: *Deleted

## 2013-10-31 ENCOUNTER — Telehealth: Payer: Self-pay | Admitting: *Deleted

## 2013-10-31 ENCOUNTER — Ambulatory Visit: Payer: BC Managed Care – PPO

## 2013-10-31 ENCOUNTER — Ambulatory Visit (HOSPITAL_BASED_OUTPATIENT_CLINIC_OR_DEPARTMENT_OTHER): Payer: BC Managed Care – PPO

## 2013-10-31 VITALS — BP 125/71 | HR 69 | Temp 98.3°F | Resp 18

## 2013-10-31 DIAGNOSIS — R1310 Dysphagia, unspecified: Secondary | ICD-10-CM

## 2013-10-31 DIAGNOSIS — R7989 Other specified abnormal findings of blood chemistry: Secondary | ICD-10-CM

## 2013-10-31 DIAGNOSIS — N179 Acute kidney failure, unspecified: Secondary | ICD-10-CM

## 2013-10-31 DIAGNOSIS — C01 Malignant neoplasm of base of tongue: Secondary | ICD-10-CM

## 2013-10-31 MED ORDER — SODIUM CHLORIDE 0.9 % IV SOLN
1000.0000 mL | Freq: Once | INTRAVENOUS | Status: AC
  Administered 2013-10-31: 11:00:00 via INTRAVENOUS

## 2013-10-31 NOTE — Telephone Encounter (Signed)
Per staff message and POF I have scheduled appts. Advised scheduler of appts. JMW  

## 2013-10-31 NOTE — Telephone Encounter (Signed)
Pt reported he has not received his Feeding tube supplies from [facility] yet.  This office faxed signed order form to them on 9/22.   S/w Judson Roch, home care RN w/ [facility].  She says [facility] was confused about the order since we did not need any enteral nutrition yet, just the supplies.  Called [facility] at [phone] to check status of order and clarify if needed.  S/w Rep who will mark order as urgent to get supplies to pt as soon as possible.

## 2013-10-31 NOTE — Patient Instructions (Signed)
Dehydration, Adult Dehydration is when you lose more fluids from the body than you take in. Vital organs like the kidneys, brain, and heart cannot function without a proper amount of fluids and salt. Any loss of fluids from the body can cause dehydration.  CAUSES   Vomiting.  Diarrhea.  Excessive sweating.  Excessive urine output.  Fever. SYMPTOMS  Mild dehydration  Thirst.  Dry lips.  Slightly dry mouth. Moderate dehydration  Very dry mouth.  Sunken eyes.  Skin does not bounce back quickly when lightly pinched and released.  Dark urine and decreased urine production.  Decreased tear production.  Headache. Severe dehydration  Very dry mouth.  Extreme thirst.  Rapid, weak pulse (more than 100 beats per minute at rest).  Cold hands and feet.  Not able to sweat in spite of heat and temperature.  Rapid breathing.  Blue lips.  Confusion and lethargy.  Difficulty being awakened.  Minimal urine production.  No tears. DIAGNOSIS  Your caregiver will diagnose dehydration based on your symptoms and your exam. Blood and urine tests will help confirm the diagnosis. The diagnostic evaluation should also identify the cause of dehydration. TREATMENT  Treatment of mild or moderate dehydration can often be done at home by increasing the amount of fluids that you drink. It is best to drink small amounts of fluid more often. Drinking too much at one time can make vomiting worse. Refer to the home care instructions below. Severe dehydration needs to be treated at the hospital where you will probably be given intravenous (IV) fluids that contain water and electrolytes. HOME CARE INSTRUCTIONS   Ask your caregiver about specific rehydration instructions.  Drink enough fluids to keep your urine clear or pale yellow.  Drink small amounts frequently if you have nausea and vomiting.  Eat as you normally do.  Avoid:  Foods or drinks high in sugar.  Carbonated  drinks.  Juice.  Extremely hot or cold fluids.  Drinks with caffeine.  Fatty, greasy foods.  Alcohol.  Tobacco.  Overeating.  Gelatin desserts.  Wash your hands well to avoid spreading bacteria and viruses.  Only take over-the-counter or prescription medicines for pain, discomfort, or fever as directed by your caregiver.  Ask your caregiver if you should continue all prescribed and over-the-counter medicines.  Keep all follow-up appointments with your caregiver. SEEK MEDICAL CARE IF:  You have abdominal pain and it increases or stays in one area (localizes).  You have a rash, stiff neck, or severe headache.  You are irritable, sleepy, or difficult to awaken.  You are weak, dizzy, or extremely thirsty. SEEK IMMEDIATE MEDICAL CARE IF:   You are unable to keep fluids down or you get worse despite treatment.  You have frequent episodes of vomiting or diarrhea.  You have blood or green matter (bile) in your vomit.  You have blood in your stool or your stool looks black and tarry.  You have not urinated in 6 to 8 hours, or you have only urinated a small amount of very dark urine.  You have a fever.  You faint. MAKE SURE YOU:   Understand these instructions.  Will watch your condition.  Will get help right away if you are not doing well or get worse. Document Released: 01/24/2005 Document Revised: 04/18/2011 Document Reviewed: 09/13/2010 ExitCare Patient Information 2015 [employer]. This information is not intended to replace advice given to you by your health care provider. Make sure you discuss any questions you have with your health care   provider.  

## 2013-11-01 ENCOUNTER — Ambulatory Visit: Payer: BC Managed Care – PPO

## 2013-11-01 VITALS — BP 118/72 | HR 67 | Temp 97.6°F | Resp 16

## 2013-11-01 DIAGNOSIS — C01 Malignant neoplasm of base of tongue: Secondary | ICD-10-CM

## 2013-11-01 NOTE — Progress Notes (Signed)
In response to call from Dory Peru, RD, met patient in Infusion, provided him large syringe, split gauze and Medipore tape for PEG.  Gayleen Orem, RN, BSN, [place] at [place] [phone]

## 2013-11-01 NOTE — Patient Instructions (Signed)
Dehydration, Adult Dehydration is when you lose more fluids from the body than you take in. Vital organs like the kidneys, brain, and heart cannot function without a proper amount of fluids and salt. Any loss of fluids from the body can cause dehydration.  CAUSES   Vomiting.  Diarrhea.  Excessive sweating.  Excessive urine output.  Fever. SYMPTOMS  Mild dehydration  Thirst.  Dry lips.  Slightly dry mouth. Moderate dehydration  Very dry mouth.  Sunken eyes.  Skin does not bounce back quickly when lightly pinched and released.  Dark urine and decreased urine production.  Decreased tear production.  Headache. Severe dehydration  Very dry mouth.  Extreme thirst.  Rapid, weak pulse (more than 100 beats per minute at rest).  Cold hands and feet.  Not able to sweat in spite of heat and temperature.  Rapid breathing.  Blue lips.  Confusion and lethargy.  Difficulty being awakened.  Minimal urine production.  No tears. DIAGNOSIS  Your caregiver will diagnose dehydration based on your symptoms and your exam. Blood and urine tests will help confirm the diagnosis. The diagnostic evaluation should also identify the cause of dehydration. TREATMENT  Treatment of mild or moderate dehydration can often be done at home by increasing the amount of fluids that you drink. It is best to drink small amounts of fluid more often. Drinking too much at one time can make vomiting worse. Refer to the home care instructions below. Severe dehydration needs to be treated at the hospital where you will probably be given intravenous (IV) fluids that contain water and electrolytes. HOME CARE INSTRUCTIONS   Ask your caregiver about specific rehydration instructions.  Drink enough fluids to keep your urine clear or pale yellow.  Drink small amounts frequently if you have nausea and vomiting.  Eat as you normally do.  Avoid:  Foods or drinks high in sugar.  Carbonated  drinks.  Juice.  Extremely hot or cold fluids.  Drinks with caffeine.  Fatty, greasy foods.  Alcohol.  Tobacco.  Overeating.  Gelatin desserts.  Wash your hands well to avoid spreading bacteria and viruses.  Only take over-the-counter or prescription medicines for pain, discomfort, or fever as directed by your caregiver.  Ask your caregiver if you should continue all prescribed and over-the-counter medicines.  Keep all follow-up appointments with your caregiver. SEEK MEDICAL CARE IF:  You have abdominal pain and it increases or stays in one area (localizes).  You have a rash, stiff neck, or severe headache.  You are irritable, sleepy, or difficult to awaken.  You are weak, dizzy, or extremely thirsty. SEEK IMMEDIATE MEDICAL CARE IF:   You are unable to keep fluids down or you get worse despite treatment.  You have frequent episodes of vomiting or diarrhea.  You have blood or green matter (bile) in your vomit.  You have blood in your stool or your stool looks black and tarry.  You have not urinated in 6 to 8 hours, or you have only urinated a small amount of very dark urine.  You have a fever.  You faint. MAKE SURE YOU:   Understand these instructions.  Will watch your condition.  Will get help right away if you are not doing well or get worse. Document Released: 01/24/2005 Document Revised: 04/18/2011 Document Reviewed: 09/13/2010 ExitCare Patient Information 2015 [employer]. This information is not intended to replace advice given to you by your health care provider. Make sure you discuss any questions you have with your health care   provider.  

## 2013-11-02 ENCOUNTER — Ambulatory Visit (HOSPITAL_BASED_OUTPATIENT_CLINIC_OR_DEPARTMENT_OTHER): Payer: BC Managed Care – PPO

## 2013-11-02 VITALS — BP 121/70 | HR 67 | Temp 97.6°F | Resp 18

## 2013-11-02 DIAGNOSIS — R1310 Dysphagia, unspecified: Secondary | ICD-10-CM

## 2013-11-02 DIAGNOSIS — R7989 Other specified abnormal findings of blood chemistry: Secondary | ICD-10-CM

## 2013-11-02 DIAGNOSIS — C01 Malignant neoplasm of base of tongue: Secondary | ICD-10-CM

## 2013-11-02 DIAGNOSIS — N179 Acute kidney failure, unspecified: Secondary | ICD-10-CM

## 2013-11-02 MED ORDER — SODIUM CHLORIDE 0.9 % IV SOLN
Freq: Once | INTRAVENOUS | Status: AC
  Administered 2013-11-02: 08:00:00 via INTRAVENOUS

## 2013-11-02 NOTE — Patient Instructions (Signed)
Dehydration, Adult Dehydration is when you lose more fluids from the body than you take in. Vital organs like the kidneys, brain, and heart cannot function without a proper amount of fluids and salt. Any loss of fluids from the body can cause dehydration.  CAUSES   Vomiting.  Diarrhea.  Excessive sweating.  Excessive urine output.  Fever. SYMPTOMS  Mild dehydration  Thirst.  Dry lips.  Slightly dry mouth. Moderate dehydration  Very dry mouth.  Sunken eyes.  Skin does not bounce back quickly when lightly pinched and released.  Dark urine and decreased urine production.  Decreased tear production.  Headache. Severe dehydration  Very dry mouth.  Extreme thirst.  Rapid, weak pulse (more than 100 beats per minute at rest).  Cold hands and feet.  Not able to sweat in spite of heat and temperature.  Rapid breathing.  Blue lips.  Confusion and lethargy.  Difficulty being awakened.  Minimal urine production.  No tears. DIAGNOSIS  Your caregiver will diagnose dehydration based on your symptoms and your exam. Blood and urine tests will help confirm the diagnosis. The diagnostic evaluation should also identify the cause of dehydration. TREATMENT  Treatment of mild or moderate dehydration can often be done at home by increasing the amount of fluids that you drink. It is best to drink small amounts of fluid more often. Drinking too much at one time can make vomiting worse. Refer to the home care instructions below. Severe dehydration needs to be treated at the hospital where you will probably be given intravenous (IV) fluids that contain water and electrolytes. HOME CARE INSTRUCTIONS   Ask your caregiver about specific rehydration instructions.  Drink enough fluids to keep your urine clear or pale yellow.  Drink small amounts frequently if you have nausea and vomiting.  Eat as you normally do.  Avoid:  Foods or drinks high in sugar.  Carbonated  drinks.  Juice.  Extremely hot or cold fluids.  Drinks with caffeine.  Fatty, greasy foods.  Alcohol.  Tobacco.  Overeating.  Gelatin desserts.  Wash your hands well to avoid spreading bacteria and viruses.  Only take over-the-counter or prescription medicines for pain, discomfort, or fever as directed by your caregiver.  Ask your caregiver if you should continue all prescribed and over-the-counter medicines.  Keep all follow-up appointments with your caregiver. SEEK MEDICAL CARE IF:  You have abdominal pain and it increases or stays in one area (localizes).  You have a rash, stiff neck, or severe headache.  You are irritable, sleepy, or difficult to awaken.  You are weak, dizzy, or extremely thirsty. SEEK IMMEDIATE MEDICAL CARE IF:   You are unable to keep fluids down or you get worse despite treatment.  You have frequent episodes of vomiting or diarrhea.  You have blood or green matter (bile) in your vomit.  You have blood in your stool or your stool looks black and tarry.  You have not urinated in 6 to 8 hours, or you have only urinated a small amount of very dark urine.  You have a fever.  You faint. MAKE SURE YOU:   Understand these instructions.  Will watch your condition.  Will get help right away if you are not doing well or get worse. Document Released: 01/24/2005 Document Revised: 04/18/2011 Document Reviewed: 09/13/2010 ExitCare Patient Information 2015 [employer]. This information is not intended to replace advice given to you by your health care provider. Make sure you discuss any questions you have with your health care   provider.  

## 2013-11-04 ENCOUNTER — Ambulatory Visit
Admission: RE | Admit: 2013-11-04 | Discharge: 2013-11-04 | Disposition: A | Discharge status: Home / Self Care | Payer: BC Managed Care – PPO | Source: Ambulatory Visit | Attending: Radiation Oncology | Admitting: Radiation Oncology

## 2013-11-04 ENCOUNTER — Encounter: Payer: Self-pay | Admitting: Radiation Oncology

## 2013-11-04 ENCOUNTER — Telehealth: Payer: Self-pay

## 2013-11-04 ENCOUNTER — Encounter: Payer: Self-pay | Admitting: *Deleted

## 2013-11-04 ENCOUNTER — Telehealth: Payer: Self-pay | Admitting: *Deleted

## 2013-11-04 ENCOUNTER — Other Ambulatory Visit (HOSPITAL_BASED_OUTPATIENT_CLINIC_OR_DEPARTMENT_OTHER): Payer: BC Managed Care – PPO

## 2013-11-04 ENCOUNTER — Telehealth: Payer: Self-pay | Admitting: Hematology and Oncology

## 2013-11-04 VITALS — BP 111/82 | HR 75 | Temp 98.1°F | Resp 20 | Wt 209.2 lb

## 2013-11-04 DIAGNOSIS — Z510 Encounter for antineoplastic radiation therapy: Secondary | ICD-10-CM | POA: Diagnosis not present

## 2013-11-04 DIAGNOSIS — C01 Malignant neoplasm of base of tongue: Secondary | ICD-10-CM

## 2013-11-04 LAB — CBC WITH DIFFERENTIAL/PLATELET
BASO%: 0.9 % (ref 0.0–2.0)
Basophils Absolute: 0.1 10*3/uL (ref 0.0–0.1)
EOS%: 2.4 % (ref 0.0–7.0)
Eosinophils Absolute: 0.1 10*3/uL (ref 0.0–0.5)
HCT: 37.9 % — ABNORMAL LOW (ref 38.4–49.9)
HGB: 12.7 g/dL — ABNORMAL LOW (ref 13.0–17.1)
LYMPH%: 22.8 % (ref 14.0–49.0)
MCH: 30.6 pg (ref 27.2–33.4)
MCHC: 33.5 g/dL (ref 32.0–36.0)
MCV: 91.5 fL (ref 79.3–98.0)
MONO#: 0.6 10*3/uL (ref 0.1–0.9)
MONO%: 10.9 % (ref 0.0–14.0)
NEUT#: 3.7 10*3/uL (ref 1.5–6.5)
NEUT%: 63 % (ref 39.0–75.0)
Platelets: 373 10*3/uL (ref 140–400)
RBC: 4.14 10*6/uL — ABNORMAL LOW (ref 4.20–5.82)
RDW: 13.1 % (ref 11.0–14.6)
WBC: 5.8 10*3/uL (ref 4.0–10.3)
lymph#: 1.3 10*3/uL (ref 0.9–3.3)

## 2013-11-04 LAB — COMPREHENSIVE METABOLIC PANEL (CC13)
ALT: 14 U/L (ref 0–55)
AST: 14 U/L (ref 5–34)
Albumin: 3 g/dL — ABNORMAL LOW (ref 3.5–5.0)
Alkaline Phosphatase: 62 U/L (ref 40–150)
Anion Gap: 7 mEq/L (ref 3–11)
BUN: 23.3 mg/dL (ref 7.0–26.0)
CO2: 28 mEq/L (ref 22–29)
Calcium: 9.4 mg/dL (ref 8.4–10.4)
Chloride: 105 mEq/L (ref 98–109)
Creatinine: 1.1 mg/dL (ref 0.7–1.3)
Glucose: 100 mg/dl (ref 70–140)
Potassium: 4.3 mEq/L (ref 3.5–5.1)
Sodium: 140 mEq/L (ref 136–145)
Total Bilirubin: 0.54 mg/dL (ref 0.20–1.20)
Total Protein: 6.7 g/dL (ref 6.4–8.3)

## 2013-11-04 MED ORDER — RADIAPLEXRX EX GEL
Freq: Once | CUTANEOUS | Status: AC
  Administered 2013-11-04: 11:00:00 via TOPICAL

## 2013-11-04 NOTE — Telephone Encounter (Signed)
Supply fax sent to [facility] @ [phone].

## 2013-11-04 NOTE — Progress Notes (Signed)
To provide support and encouragement, care continuity and to assess for needs, met with patient and his wife during his Tomo New Start: 1. Wife noted concern for erythema at PEG site, she peeled back dsg, I observed some redness at a suture site but it appeared WNL.  Encouraged more complete assessment by RN during post-tmt f/u with Dr. Valere Dross. 2. Wife stated feeding tube "is too short" to allow for gravity flush, need to use plunger.  Encouraged more complete assessment by RN during post-tmt f/u with Dr. Valere Dross. 3. Wife expressed need to have PAC assessed r/t recent inability to flush prior to chemo.  I stated this would need to be addressed by Dr. Alvy Bimler. I will f/u up with patient/wife.  Gayleen Orem, RN, BSN, [place] at [place] [phone]

## 2013-11-04 NOTE — Addendum Note (Signed)
Encounter addended by: Andria Rhein, RN on: 11/04/2013  2:06 PM<BR>     Documentation filed: Chief Complaint Section, Inpatient Patient Education

## 2013-11-04 NOTE — Progress Notes (Signed)
Patient denies pain, fatigue, loss of appetite. Pt's peg tube covered with dressing, but pt requested this RN look at one stitched area on upper right side. Slight redness at stitch but no signs of infection. Wife is applying Neosporin to stitches.  Patient education completed with pt and wife.

## 2013-11-04 NOTE — Progress Notes (Signed)
Weekly Management Note:  Site: Right base of tongue/tonsil and neck Current Dose:  200  cGy Projected Dose: 7000  cGy  Narrative: The patient is seen today for routine under treatment assessment. CBCT/MVCT images/port films were reviewed. The chart was reviewed.   He begins his radiation therapy today. He was delayed because of delayed healing along his mandibular extraction sites. His next chemotherapy is in one week. He is currently asymptomatic. He states that his ear pain has subsided.  Physical Examination:  Filed Vitals:   11/04/13 1034  BP: 111/82  Pulse: 75  Temp: 98.1 F (36.7 C)  Resp: 20  .  Weight: 209 lb 3.2 oz (94.892 kg). He continues to have a brisk gag reflex. His extraction sites are covered with thin mucosa. I removed debris his left mandibular extraction site. There has been regression of his right neck adenopathy.  Laboratory data: Lab Results  Component Value Date   WBC 12.2* 10/30/2013   HGB 14.2 10/30/2013   HCT 43.4 10/30/2013   MCV 92.1 10/30/2013   PLT 219 10/30/2013     Impression: Tolerating radiation therapy well. He has clearly responding to his initial cycle of chemotherapy.  Plan: Continue radiation therapy as planned.

## 2013-11-04 NOTE — Progress Notes (Signed)
Patient education completed with patient and wife. Gave pt "Radiation and You" booklet w/all pertinent information marked and discussed, re: fatigue, hair loss/care, mouth changes/care, nausea/management, skin irritation/care, throat changes/care, jaw exercises, nutrition, pain. Gave pt Radiaplex with instructions for proper use. All questions answered. Wife and pt verbalized understanding. Wife states she has used peg tube during pt's chemo when he was not eating or drinking well. Pt is being followed by nutritionist. Encouraged pt to eat and drink all he desires when he feels well.

## 2013-11-04 NOTE — Addendum Note (Signed)
Encounter addended by: Andria Rhein, RN on: 11/04/2013 11:12 AM<BR>     Documentation filed: Notes Section

## 2013-11-04 NOTE — Telephone Encounter (Signed)
Faxed pt medical records to [facility] [employer] [facility] [employer]

## 2013-11-04 NOTE — Progress Notes (Signed)
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 He is being treated to 9.3 delivered field widths corresponding to one set of IMRT treatment devices [phone]).

## 2013-11-04 NOTE — Telephone Encounter (Signed)
Message copied by Patton Salles on Mon Nov 04, 2013  1:56 PM ------      Message from: [facility], [place]      Created: Mon Nov 04, 2013 12:56 PM      Regarding:  cre       Please let him know labs looking good, creatinine back to baseline      ----- Message -----         From: [facility]         Sent: 11/04/2013  11:34 AM           To: Heath Lark, MD                   ------

## 2013-11-04 NOTE — Telephone Encounter (Signed)
Left message with results below 

## 2013-11-05 ENCOUNTER — Ambulatory Visit: Payer: BC Managed Care – PPO | Admitting: Nutrition

## 2013-11-05 ENCOUNTER — Ambulatory Visit
Admission: RE | Admit: 2013-11-05 | Discharge: 2013-11-05 | Disposition: A | Discharge status: Home / Self Care | Payer: BC Managed Care – PPO | Source: Ambulatory Visit | Attending: Radiation Oncology | Admitting: Radiation Oncology

## 2013-11-05 DIAGNOSIS — Z510 Encounter for antineoplastic radiation therapy: Secondary | ICD-10-CM | POA: Diagnosis not present

## 2013-11-05 NOTE — Progress Notes (Signed)
Nutrition followup completed with patient and wife.  Patient reports he feels well overall.  He is frustrated that his Port-A-Cath is not functioning.  Patient has been flushing feeding tube without difficulty.  Patient's wife gave [employer] breakfast essentials via feeding tube after his last chemotherapy.  Patient's wife states patient was very fatigued and was sleeping a lot.  He was not eating or drinking much.  Currently, patient is eating a wide variety of foods and denies difficulty swallowing.  He has no nausea.    September 28: 209.2 pounds. September 23: 205.3 pounds. September 11: 215 pounds.  Estimated nutrition needs: 2300-2500 calories, 130-140 g protein, 2.6 L fluid.  Nutrition diagnosis: Predicted suboptimal energy intake continues.  Intervention: Educated patient to continue oral intake as tolerated to promote weight maintenance. Educated patient on strategies for utilizing Ensure Plus via feeding tube if oral intake decreases. Recommended patient give Ensure Plus, one can, 4 times a day with 120 cc free water before and after bolus feeding if unable to consume adequate oral intake.  Patient will increase bolus feeding to 1-1/2 cans 4 times a day with and eventual goal of 7 cans daily.  Patient encouraged to drink a minimum of 720 cc free water in addition to feedings and water flushes.  Fact sheets provided. Questions were answered and teach back method used. Provided patient with one complementary case of Ensure Plus.  Monitoring, evaluation, goals: Patient will tolerate oral intake with adequate calories and protein to promote weight maintenance.  Patient will supplement oral intake with bolus feeding ss needed.  Next visit: Tuesday, October 6.  **Disclaimer: This note was dictated with voice recognition software. Similar sounding words can inadvertently be transcribed and this note may contain transcription errors which may not have been corrected upon publication of  note.**

## 2013-11-06 ENCOUNTER — Ambulatory Visit
Admission: RE | Admit: 2013-11-06 | Discharge: 2013-11-06 | Disposition: A | Discharge status: Home / Self Care | Payer: BC Managed Care – PPO | Source: Ambulatory Visit | Attending: Radiation Oncology | Admitting: Radiation Oncology

## 2013-11-06 DIAGNOSIS — Z510 Encounter for antineoplastic radiation therapy: Secondary | ICD-10-CM | POA: Diagnosis not present

## 2013-11-07 ENCOUNTER — Ambulatory Visit
Admission: RE | Admit: 2013-11-07 | Discharge: 2013-11-07 | Disposition: A | Discharge status: Home / Self Care | Payer: BC Managed Care – PPO | Source: Ambulatory Visit | Attending: Radiation Oncology | Admitting: Radiation Oncology

## 2013-11-07 DIAGNOSIS — Z931 Gastrostomy status: Secondary | ICD-10-CM | POA: Insufficient documentation

## 2013-11-07 DIAGNOSIS — Z510 Encounter for antineoplastic radiation therapy: Secondary | ICD-10-CM | POA: Insufficient documentation

## 2013-11-07 DIAGNOSIS — C01 Malignant neoplasm of base of tongue: Secondary | ICD-10-CM | POA: Insufficient documentation

## 2013-11-08 ENCOUNTER — Encounter: Payer: Self-pay | Admitting: Hematology and Oncology

## 2013-11-08 ENCOUNTER — Ambulatory Visit (HOSPITAL_BASED_OUTPATIENT_CLINIC_OR_DEPARTMENT_OTHER): Payer: BC Managed Care – PPO | Admitting: Hematology and Oncology

## 2013-11-08 ENCOUNTER — Encounter: Payer: Self-pay | Admitting: *Deleted

## 2013-11-08 ENCOUNTER — Other Ambulatory Visit (HOSPITAL_BASED_OUTPATIENT_CLINIC_OR_DEPARTMENT_OTHER): Payer: BC Managed Care – PPO

## 2013-11-08 ENCOUNTER — Telehealth: Payer: Self-pay | Admitting: Hematology and Oncology

## 2013-11-08 ENCOUNTER — Ambulatory Visit
Admission: RE | Admit: 2013-11-08 | Discharge: 2013-11-08 | Disposition: A | Discharge status: Home / Self Care | Payer: BC Managed Care – PPO | Source: Ambulatory Visit | Attending: Radiation Oncology | Admitting: Radiation Oncology

## 2013-11-08 VITALS — BP 114/76 | HR 68 | Temp 98.2°F | Resp 18 | Ht 65.0 in | Wt 210.1 lb

## 2013-11-08 DIAGNOSIS — Z452 Encounter for adjustment and management of vascular access device: Secondary | ICD-10-CM | POA: Insufficient documentation

## 2013-11-08 DIAGNOSIS — Z931 Gastrostomy status: Secondary | ICD-10-CM

## 2013-11-08 DIAGNOSIS — C01 Malignant neoplasm of base of tongue: Secondary | ICD-10-CM

## 2013-11-08 DIAGNOSIS — R7989 Other specified abnormal findings of blood chemistry: Secondary | ICD-10-CM

## 2013-11-08 DIAGNOSIS — Z510 Encounter for antineoplastic radiation therapy: Secondary | ICD-10-CM | POA: Diagnosis not present

## 2013-11-08 DIAGNOSIS — R748 Abnormal levels of other serum enzymes: Secondary | ICD-10-CM

## 2013-11-08 LAB — CBC WITH DIFFERENTIAL/PLATELET
BASO%: 0.9 % (ref 0.0–2.0)
Basophils Absolute: 0 10*3/uL (ref 0.0–0.1)
EOS%: 4.4 % (ref 0.0–7.0)
Eosinophils Absolute: 0.2 10*3/uL (ref 0.0–0.5)
HCT: 39.8 % (ref 38.4–49.9)
HGB: 13.1 g/dL (ref 13.0–17.1)
LYMPH%: 32.6 % (ref 14.0–49.0)
MCH: 30.7 pg (ref 27.2–33.4)
MCHC: 32.9 g/dL (ref 32.0–36.0)
MCV: 93.1 fL (ref 79.3–98.0)
MONO#: 0.5 10*3/uL (ref 0.1–0.9)
MONO%: 12.9 % (ref 0.0–14.0)
NEUT#: 2 10*3/uL (ref 1.5–6.5)
NEUT%: 49.2 % (ref 39.0–75.0)
Platelets: 547 10*3/uL — ABNORMAL HIGH (ref 140–400)
RBC: 4.28 10*6/uL (ref 4.20–5.82)
RDW: 13.4 % (ref 11.0–14.6)
WBC: 4 10*3/uL (ref 4.0–10.3)
lymph#: 1.3 10*3/uL (ref 0.9–3.3)

## 2013-11-08 LAB — COMPREHENSIVE METABOLIC PANEL (CC13)
ALT: 16 U/L (ref 0–55)
AST: 12 U/L (ref 5–34)
Albumin: 3.2 g/dL — ABNORMAL LOW (ref 3.5–5.0)
Alkaline Phosphatase: 73 U/L (ref 40–150)
Anion Gap: 7 mEq/L (ref 3–11)
BUN: 19.6 mg/dL (ref 7.0–26.0)
CO2: 29 mEq/L (ref 22–29)
Calcium: 9.3 mg/dL (ref 8.4–10.4)
Chloride: 104 mEq/L (ref 98–109)
Creatinine: 1.2 mg/dL (ref 0.7–1.3)
Glucose: 119 mg/dl (ref 70–140)
Potassium: 4.4 mEq/L (ref 3.5–5.1)
Sodium: 139 mEq/L (ref 136–145)
Total Bilirubin: 0.53 mg/dL (ref 0.20–1.20)
Total Protein: 6.9 g/dL (ref 6.4–8.3)

## 2013-11-08 NOTE — Progress Notes (Signed)
To provide support and encouragement, care continuity and to assess for needs, met with patient and his wife during est pt appt with Dr. Alvy Bimler: 1. He stated he has tolerated his 1st week of RT well.  He indicated he is using a razor to shave, I re-educated the importance of using electric razor. 2. They discussed concern about PAC and RNs' inability to get blood return during last chemo.  Dr. Alvy Bimler ensured them that its use OK as long as flushing well which it is.  She stated she would issue note with chemo order accordingly.  She noted that a PICC would be the preferred route if the PORT cannot be used. They did not express any further needs or concerns at this time, I encouraged them to contact me if that changes.   Gayleen Orem, RN, BSN, [place] at [place] [phone]

## 2013-11-08 NOTE — Assessment & Plan Note (Signed)
The feeding tube site looks okay with no signs of infection.

## 2013-11-08 NOTE — Assessment & Plan Note (Signed)
This was a transition issue related to dehydration. I will continue to monitor this carefully and will support him with IV fluids next week.

## 2013-11-08 NOTE — Assessment & Plan Note (Signed)
The patient had transient elevated creatinine of the cycle 1 of chemotherapy, reversed with IV fluids. I would like to keep the same dose of chemotherapy, however, we'll support him through with IV fluids throughout next week. He agreed with the plan of care.

## 2013-11-08 NOTE — Progress Notes (Signed)
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 There are right level 2 and level 3 adenopathy as described and right peritracheal lymph node within the superior mediastinum   09/12/2013 Surgery Laryngoscopy showed the oropharynx revealed a firm friable mass involving the lateral base of tongue and inferior tonsillar fossa.    09/13/2013 Pathology Results Accession: [id number] biopsy confirmed squamous cell carcinoma, HPV positive   09/25/2013 Imaging PET scan showed right base of tongue mass exhibits intense FDG uptake compatible with primary head neck carcinoma with associated hypermetabolic right level to adenopathy   10/03/2013 Procedure He had dental extraction   10/21/2013 -  Chemotherapy He start on high dose cisplatin   11/04/2013 -  Radiation Therapy He received radiation therapy    INTERVAL HISTORY: Please see below for problem oriented charting. He is seen prior to cycle 2 of treatment. He is doing well. The lymph nodes are smaller. He denies any throat pain.  REVIEW OF SYSTEMS:   Constitutional: Denies fevers, chills or abnormal weight loss Eyes: Denies blurriness of vision Ears, nose, mouth, throat, and face: Denies mucositis or sore  throat Respiratory: Denies cough, dyspnea or wheezes Cardiovascular: Denies palpitation, chest discomfort or lower extremity swelling Gastrointestinal:  Denies nausea, heartburn or change in bowel habits Skin: Denies abnormal skin rashes Lymphatics: Denies new lymphadenopathy or easy bruising Neurological:Denies numbness, tingling or new weaknesses Behavioral/Psych: Mood is stable, no new changes  All other systems were reviewed with the patient and are negative.  I have reviewed the past medical history, past surgical history, social history and family history with the patient and they are unchanged from previous note.  ALLERGIES:  is allergic to codeine and velosef.  MEDICATIONS:  Current Outpatient Prescriptions  Medication Sig Dispense Refill  . hyaluronate sodium (RADIAPLEXRX) GEL Apply 1 application topically 2 (two) times daily.      Marland Kitchen HYDROcodone-acetaminophen (NORCO) 7.5-325 MG per tablet Take 1-2 tablets by mouth every 4 (four) hours as needed for moderate pain.  50 tablet  0  . lidocaine-prilocaine (EMLA) cream Apply 1 application topically as needed (per port-a-cath).      . ondansetron (ZOFRAN) 8 MG tablet Take 8 mg by mouth every 8 (eight) hours as needed for nausea or vomiting.      . prochlorperazine (COMPAZINE) 10 MG tablet Take 10 mg by mouth every 6 (six) hours as needed for nausea or vomiting.      . promethazine (PHENERGAN) 25 MG suppository Place 25 mg rectally every 6 (six) hours as needed for nausea or vomiting.      . sodium fluoride (FLUORISHIELD) 1.1 % GEL dental gel Place 1 application onto teeth at bedtime.      . traMADol Veatrice Bourbon)  50 MG tablet Take 50 mg by mouth every 6 (six) hours as needed for moderate pain.       No current facility-administered medications for this visit.    PHYSICAL EXAMINATION: ECOG PERFORMANCE STATUS: 0 - Asymptomatic  Filed Vitals:   11/08/13 0939  BP: 114/76  Pulse: 68  Temp: 98.2 F (36.8 C)  Resp: 18   Filed Weights    11/08/13 0939  Weight: 210 lb 1.6 oz (95.301 kg)    GENERAL:alert, no distress and comfortable SKIN: skin color, texture, turgor are normal, no rashes or significant lesions EYES: normal, Conjunctiva are pink and non-injected, sclera clear OROPHARYNX:no exudate, no erythema and lips, buccal mucosa, and tongue normal  NECK: supple, thyroid normal size, non-tender, without nodularity LYMPH:  Previously palpable lymphadenopathy has regressed in size. LUNGS: clear to auscultation and percussion with normal breathing effort HEART: regular rate & rhythm and no murmurs and no lower extremity edema ABDOMEN:abdomen soft, non-tender and normal bowel sounds Musculoskeletal:no cyanosis of digits and no clubbing  NEURO: alert & oriented x 3 with fluent speech, no focal motor/sensory deficits  LABORATORY DATA:  I have reviewed the data as listed    Component Value Date/Time   NA 139 11/08/2013 0822   NA 139 10/16/2013 1257   K 4.4 11/08/2013 0822   K 4.4 10/16/2013 1257   CL 102 10/16/2013 1257   CO2 29 11/08/2013 0822   CO2 24 10/16/2013 1257   GLUCOSE 119 11/08/2013 0822   GLUCOSE 98 10/16/2013 1257   BUN 19.6 11/08/2013 0822   BUN 14 10/16/2013 1257   CREATININE 1.2 11/08/2013 0822   CREATININE 1.12 10/17/2013 1857   CALCIUM 9.3 11/08/2013 0822   CALCIUM 9.3 10/16/2013 1257   PROT 6.9 11/08/2013 0822   ALBUMIN 3.2* 11/08/2013 0822   AST 12 11/08/2013 0822   ALT 16 11/08/2013 0822   ALKPHOS 73 11/08/2013 0822   BILITOT 0.53 11/08/2013 0822   GFRNONAA 71* 10/17/2013 1857   GFRAA 82* 10/17/2013 1857    No results found for this basename: SPEP, UPEP,  kappa and lambda light chains    Lab Results  Component Value Date   WBC 4.0 11/08/2013   NEUTROABS 2.0 11/08/2013   HGB 13.1 11/08/2013   HCT 39.8 11/08/2013   MCV 93.1 11/08/2013   PLT 547* 11/08/2013      Chemistry      Component Value Date/Time   NA 139 11/08/2013 0822   NA 139 10/16/2013 1257   K 4.4 11/08/2013 0822   K 4.4 10/16/2013 1257   CL 102  10/16/2013 1257   CO2 29 11/08/2013 0822   CO2 24 10/16/2013 1257   BUN 19.6 11/08/2013 0822   BUN 14 10/16/2013 1257   CREATININE 1.2 11/08/2013 0822   CREATININE 1.12 10/17/2013 1857      Component Value Date/Time   CALCIUM 9.3 11/08/2013 0822   CALCIUM 9.3 10/16/2013 1257   ALKPHOS 73 11/08/2013 0822   AST 12 11/08/2013 0822   ALT 16 11/08/2013 0822   BILITOT 0.53 11/08/2013 0822      ASSESSMENT & PLAN:  Squamous cell carcinoma of the right base of tongue The patient had transient elevated creatinine of the cycle 1 of chemotherapy, reversed with IV fluids. I would like to keep the same dose of chemotherapy, however, we'll support him through with IV fluids throughout next week. He agreed with the plan of care.  Elevated serum creatinine This was a transition issue related to dehydration. I  will continue to monitor this carefully and will support him with IV fluids next week.  S/P gastrostomy The feeding tube site looks okay with no signs of infection.    Encounter for care related to vascular access port His port was not giving blood return. Recent dye study showed that it is patent. As long as we can infuse fluid without problems, I will continue to use support consider replacing it.   No orders of the defined types were placed in this encounter.   All questions were answered. The patient knows to call the clinic with any problems, questions or concerns. No barriers to learning was detected. I spent 30 minutes counseling the patient face to face. The total time spent in the appointment was 40 minutes and more than 50% was on counseling and review of test results     [facility], [place], MD 11/08/2013 10:31 AM

## 2013-11-08 NOTE — Telephone Encounter (Signed)
gv pt appt schedule for oct. per 10/2 pof NG will see pt in inf area @ 10:15am 10/9.

## 2013-11-08 NOTE — Assessment & Plan Note (Signed)
His port was not giving blood return. Recent dye study showed that it is patent. As long as we can infuse fluid without problems, I will continue to use support consider replacing it.

## 2013-11-11 ENCOUNTER — Ambulatory Visit
Admission: RE | Admit: 2013-11-11 | Discharge: 2013-11-11 | Disposition: A | Discharge status: Home / Self Care | Payer: BC Managed Care – PPO | Source: Ambulatory Visit | Attending: Radiation Oncology | Admitting: Radiation Oncology

## 2013-11-11 ENCOUNTER — Ambulatory Visit (HOSPITAL_BASED_OUTPATIENT_CLINIC_OR_DEPARTMENT_OTHER): Payer: BC Managed Care – PPO

## 2013-11-11 ENCOUNTER — Encounter: Payer: Self-pay | Admitting: Radiation Oncology

## 2013-11-11 VITALS — BP 106/72 | HR 69 | Temp 98.2°F | Resp 20 | Wt 210.7 lb

## 2013-11-11 VITALS — BP 110/76 | HR 46 | Temp 98.2°F | Resp 18

## 2013-11-11 DIAGNOSIS — C01 Malignant neoplasm of base of tongue: Secondary | ICD-10-CM

## 2013-11-11 DIAGNOSIS — Z452 Encounter for adjustment and management of vascular access device: Secondary | ICD-10-CM

## 2013-11-11 DIAGNOSIS — Z5111 Encounter for antineoplastic chemotherapy: Secondary | ICD-10-CM

## 2013-11-11 DIAGNOSIS — Z510 Encounter for antineoplastic radiation therapy: Secondary | ICD-10-CM | POA: Diagnosis not present

## 2013-11-11 MED ORDER — DEXAMETHASONE SODIUM PHOSPHATE 20 MG/5ML IJ SOLN
12.0000 mg | Freq: Once | INTRAMUSCULAR | Status: AC
  Administered 2013-11-11: 12 mg via INTRAVENOUS

## 2013-11-11 MED ORDER — PALONOSETRON HCL INJECTION 0.25 MG/5ML
0.2500 mg | Freq: Once | INTRAVENOUS | Status: AC
  Administered 2013-11-11: 0.25 mg via INTRAVENOUS

## 2013-11-11 MED ORDER — SODIUM CHLORIDE 0.9 % IJ SOLN
10.0000 mL | INTRAMUSCULAR | Status: DC | PRN
  Administered 2013-11-11: 10 mL
  Filled 2013-11-11: qty 10

## 2013-11-11 MED ORDER — SODIUM CHLORIDE 0.9 % IV SOLN
150.0000 mg | Freq: Once | INTRAVENOUS | Status: AC
  Administered 2013-11-11: 150 mg via INTRAVENOUS
  Filled 2013-11-11: qty 5

## 2013-11-11 MED ORDER — CISPLATIN CHEMO INJECTION 100MG/100ML
100.0000 mg/m2 | Freq: Once | INTRAVENOUS | Status: AC
  Administered 2013-11-11: 212 mg via INTRAVENOUS
  Filled 2013-11-11: qty 212

## 2013-11-11 MED ORDER — DEXAMETHASONE SODIUM PHOSPHATE 20 MG/5ML IJ SOLN
INTRAMUSCULAR | Status: AC
Start: 2013-11-11 — End: 2013-11-11
  Filled 2013-11-11: qty 5

## 2013-11-11 MED ORDER — PALONOSETRON HCL INJECTION 0.25 MG/5ML
INTRAVENOUS | Status: AC
  Filled 2013-11-11: qty 5

## 2013-11-11 MED ORDER — POTASSIUM CHLORIDE 2 MEQ/ML IV SOLN
Freq: Once | INTRAVENOUS | Status: AC
  Administered 2013-11-11: 09:00:00 via INTRAVENOUS
  Filled 2013-11-11: qty 10

## 2013-11-11 MED ORDER — ALTEPLASE 2 MG IJ SOLR
2.0000 mg | Freq: Once | INTRAMUSCULAR | Status: AC | PRN
  Administered 2013-11-11: 2 mg
  Filled 2013-11-11: qty 2

## 2013-11-11 MED ORDER — HEPARIN SOD (PORK) LOCK FLUSH 100 UNIT/ML IV SOLN
500.0000 [IU] | Freq: Once | INTRAVENOUS | Status: AC | PRN
  Administered 2013-11-11: 500 [IU]
  Filled 2013-11-11: qty 5

## 2013-11-11 NOTE — Patient Instructions (Signed)
[facility] Discharge Instructions for Patients Receiving Chemotherapy  Today you received the following chemotherapy agents Cisplatin.  To help prevent nausea and vomiting after your treatment, we encourage you to take your nausea medication as directed.    If you develop nausea and vomiting that is not controlled by your nausea medication, call the clinic.   BELOW ARE SYMPTOMS THAT SHOULD BE REPORTED IMMEDIATELY:  *FEVER GREATER THAN 100.5 F  *CHILLS WITH OR WITHOUT FEVER  NAUSEA AND VOMITING THAT IS NOT CONTROLLED WITH YOUR NAUSEA MEDICATION  *UNUSUAL SHORTNESS OF BREATH  *UNUSUAL BRUISING OR BLEEDING  TENDERNESS IN MOUTH AND THROAT WITH OR WITHOUT PRESENCE OF ULCERS  *URINARY PROBLEMS  *BOWEL PROBLEMS  UNUSUAL RASH Items with * indicate a potential emergency and should be followed up as soon as possible.  Feel free to call the clinic you have any questions or concerns. The clinic phone number is [phone].    

## 2013-11-11 NOTE — Progress Notes (Signed)
Weekly Management Note:  Site: Right tonsil/base of tongue Current Dose:  1200  cGy Projected Dose: 7000  cGy  Narrative: The patient is seen today for routine under treatment assessment. CBCT/MVCT images/port films were reviewed. The chart was reviewed.   He is scheduled for his second cycle of chemotherapy today. He is still eating by mouth. He is flushing his PEG tube. No weight loss.  Physical Examination:  Filed Vitals:   11/11/13 0945  BP: 106/72  Pulse: 69  Temp: 98.2 F (36.8 C)  Resp: 20  .  Weight: 210 lb 11.2 oz (95.573 kg). Examination is difficult secondary to a brisk gag reflex. There is mild erythema along the right oropharynx. His right neck adenopathy is regressing.  Laboratory data: Lab Results  Component Value Date   WBC 4.0 11/08/2013   HGB 13.1 11/08/2013   HCT 39.8 11/08/2013   MCV 93.1 11/08/2013   PLT 547* 11/08/2013     Impression: Tolerating radiation therapy well.  Plan: Continue radiation therapy as planned.

## 2013-11-11 NOTE — Progress Notes (Signed)
MD Alvy Bimler states ok to start chemotherapy with output of 2104ml.

## 2013-11-11 NOTE — Progress Notes (Signed)
TPA used for patient port a catheter. Obtained blood return 1 hour after dwelling.

## 2013-11-11 NOTE — Progress Notes (Signed)
Patient denies pain, fatigue, loss of appetite. He is flushing peg tube, eating well, no weight loss in past week. He will receive IVF, chemo today and see nutritionist. No skin changes at this time.

## 2013-11-12 ENCOUNTER — Ambulatory Visit
Admission: RE | Admit: 2013-11-12 | Discharge: 2013-11-12 | Disposition: A | Discharge status: Home / Self Care | Payer: BC Managed Care – PPO | Source: Ambulatory Visit | Attending: Radiation Oncology | Admitting: Radiation Oncology

## 2013-11-12 ENCOUNTER — Ambulatory Visit (HOSPITAL_BASED_OUTPATIENT_CLINIC_OR_DEPARTMENT_OTHER): Payer: BC Managed Care – PPO

## 2013-11-12 ENCOUNTER — Ambulatory Visit: Payer: BC Managed Care – PPO | Admitting: Nutrition

## 2013-11-12 VITALS — BP 135/87 | HR 83 | Temp 98.5°F | Resp 20

## 2013-11-12 DIAGNOSIS — Z510 Encounter for antineoplastic radiation therapy: Secondary | ICD-10-CM | POA: Diagnosis not present

## 2013-11-12 DIAGNOSIS — R748 Abnormal levels of other serum enzymes: Secondary | ICD-10-CM

## 2013-11-12 DIAGNOSIS — R7989 Other specified abnormal findings of blood chemistry: Secondary | ICD-10-CM

## 2013-11-12 DIAGNOSIS — R1310 Dysphagia, unspecified: Secondary | ICD-10-CM

## 2013-11-12 MED ORDER — PROMETHAZINE HCL 25 MG/ML IJ SOLN
25.0000 mg | Freq: Every day | INTRAMUSCULAR | Status: DC | PRN
  Administered 2013-11-12: 25 mg via INTRAVENOUS
  Filled 2013-11-12: qty 1

## 2013-11-12 MED ORDER — SODIUM CHLORIDE 0.9 % IV SOLN
Freq: Once | INTRAVENOUS | Status: AC
  Administered 2013-11-12: 11:00:00 via INTRAVENOUS

## 2013-11-12 MED ORDER — SODIUM CHLORIDE 0.9 % IJ SOLN
10.0000 mL | INTRAMUSCULAR | Status: DC | PRN
  Administered 2013-11-12: 10 mL
  Filled 2013-11-12: qty 10

## 2013-11-12 MED ORDER — HEPARIN SOD (PORK) LOCK FLUSH 100 UNIT/ML IV SOLN
500.0000 [IU] | Freq: Once | INTRAVENOUS | Status: AC | PRN
  Administered 2013-11-12: 500 [IU]
  Filled 2013-11-12: qty 5

## 2013-11-12 NOTE — Progress Notes (Signed)
Nutrition followup completed with patient and wife.  Patient continues to feel well.  He did have a small amount of nausea this morning after chemotherapy yesterday.   Receiving IVF this week. Weight is relatively stable and documented as 210.7 pounds October 5.  Nutrition diagnosis: predicted suboptimal energy intake continues.  Intervention:  Patient to continue oral intake along with Ensure Plus as needed to promote weight maintenance. Teach back method used.  Monitoring, evaluation, goals: Patient will tolerate adequate calories and protein to promote continued weight maintenance.  Next visit: Tuesday, October 13.  **Disclaimer: This note was dictated with voice recognition software. Similar sounding words can inadvertently be transcribed and this note may contain transcription errors which may not have been corrected upon publication of note.**

## 2013-11-12 NOTE — Patient Instructions (Signed)
Dehydration, Adult Dehydration is when you lose more fluids from the body than you take in. Vital organs like the kidneys, brain, and heart cannot function without a proper amount of fluids and salt. Any loss of fluids from the body can cause dehydration.  CAUSES   Vomiting.  Diarrhea.  Excessive sweating.  Excessive urine output.  Fever. SYMPTOMS  Mild dehydration  Thirst.  Dry lips.  Slightly dry mouth. Moderate dehydration  Very dry mouth.  Sunken eyes.  Skin does not bounce back quickly when lightly pinched and released.  Dark urine and decreased urine production.  Decreased tear production.  Headache. Severe dehydration  Very dry mouth.  Extreme thirst.  Rapid, weak pulse (more than 100 beats per minute at rest).  Cold hands and feet.  Not able to sweat in spite of heat and temperature.  Rapid breathing.  Blue lips.  Confusion and lethargy.  Difficulty being awakened.  Minimal urine production.  No tears. DIAGNOSIS  Your caregiver will diagnose dehydration based on your symptoms and your exam. Blood and urine tests will help confirm the diagnosis. The diagnostic evaluation should also identify the cause of dehydration. TREATMENT  Treatment of mild or moderate dehydration can often be done at home by increasing the amount of fluids that you drink. It is best to drink small amounts of fluid more often. Drinking too much at one time can make vomiting worse. Refer to the home care instructions below. Severe dehydration needs to be treated at the hospital where you will probably be given intravenous (IV) fluids that contain water and electrolytes. HOME CARE INSTRUCTIONS   Ask your caregiver about specific rehydration instructions.  Drink enough fluids to keep your urine clear or pale yellow.  Drink small amounts frequently if you have nausea and vomiting.  Eat as you normally do.  Avoid:  Foods or drinks high in sugar.  Carbonated  drinks.  Juice.  Extremely hot or cold fluids.  Drinks with caffeine.  Fatty, greasy foods.  Alcohol.  Tobacco.  Overeating.  Gelatin desserts.  Wash your hands well to avoid spreading bacteria and viruses.  Only take over-the-counter or prescription medicines for pain, discomfort, or fever as directed by your caregiver.  Ask your caregiver if you should continue all prescribed and over-the-counter medicines.  Keep all follow-up appointments with your caregiver. SEEK MEDICAL CARE IF:  You have abdominal pain and it increases or stays in one area (localizes).  You have a rash, stiff neck, or severe headache.  You are irritable, sleepy, or difficult to awaken.  You are weak, dizzy, or extremely thirsty. SEEK IMMEDIATE MEDICAL CARE IF:   You are unable to keep fluids down or you get worse despite treatment.  You have frequent episodes of vomiting or diarrhea.  You have blood or green matter (bile) in your vomit.  You have blood in your stool or your stool looks black and tarry.  You have not urinated in 6 to 8 hours, or you have only urinated a small amount of very dark urine.  You have a fever.  You faint. MAKE SURE YOU:   Understand these instructions.  Will watch your condition.  Will get help right away if you are not doing well or get worse. Document Released: 01/24/2005 Document Revised: 04/18/2011 Document Reviewed: 09/13/2010 ExitCare Patient Information 2015 [employer]. This information is not intended to replace advice given to you by your health care provider. Make sure you discuss any questions you have with your health care   provider.  

## 2013-11-13 ENCOUNTER — Ambulatory Visit (HOSPITAL_BASED_OUTPATIENT_CLINIC_OR_DEPARTMENT_OTHER): Payer: BC Managed Care – PPO

## 2013-11-13 ENCOUNTER — Ambulatory Visit
Admission: RE | Admit: 2013-11-13 | Discharge: 2013-11-13 | Disposition: A | Discharge status: Home / Self Care | Payer: BC Managed Care – PPO | Source: Ambulatory Visit | Attending: Radiation Oncology | Admitting: Radiation Oncology

## 2013-11-13 VITALS — BP 124/86 | HR 68 | Temp 98.2°F | Resp 18

## 2013-11-13 DIAGNOSIS — R748 Abnormal levels of other serum enzymes: Secondary | ICD-10-CM

## 2013-11-13 DIAGNOSIS — Z510 Encounter for antineoplastic radiation therapy: Secondary | ICD-10-CM | POA: Diagnosis not present

## 2013-11-13 DIAGNOSIS — R7989 Other specified abnormal findings of blood chemistry: Secondary | ICD-10-CM

## 2013-11-13 DIAGNOSIS — R1310 Dysphagia, unspecified: Secondary | ICD-10-CM

## 2013-11-13 MED ORDER — PROMETHAZINE HCL 25 MG/ML IJ SOLN
25.0000 mg | Freq: Every day | INTRAMUSCULAR | Status: DC | PRN
  Filled 2013-11-13: qty 1

## 2013-11-13 MED ORDER — SODIUM CHLORIDE 0.9 % IV SOLN
Freq: Once | INTRAVENOUS | Status: AC
  Administered 2013-11-13: 09:00:00 via INTRAVENOUS

## 2013-11-13 MED ORDER — HEPARIN SOD (PORK) LOCK FLUSH 100 UNIT/ML IV SOLN
500.0000 [IU] | Freq: Once | INTRAVENOUS | Status: AC | PRN
  Administered 2013-11-13: 500 [IU]
  Filled 2013-11-13: qty 5

## 2013-11-13 MED ORDER — ONDANSETRON 8 MG/50ML IVPB (CHCC): 8.0000 mg | Freq: Every day | INTRAVENOUS | Status: DC | PRN

## 2013-11-13 MED ORDER — SODIUM CHLORIDE 0.9 % IJ SOLN
10.0000 mL | INTRAMUSCULAR | Status: DC | PRN
  Administered 2013-11-13: 10 mL
  Filled 2013-11-13: qty 10

## 2013-11-13 NOTE — Patient Instructions (Signed)
Dehydration, Adult Dehydration is when you lose more fluids from the body than you take in. Vital organs like the kidneys, brain, and heart cannot function without a proper amount of fluids and salt. Any loss of fluids from the body can cause dehydration.  CAUSES   Vomiting.  Diarrhea.  Excessive sweating.  Excessive urine output.  Fever. SYMPTOMS  Mild dehydration  Thirst.  Dry lips.  Slightly dry mouth. Moderate dehydration  Very dry mouth.  Sunken eyes.  Skin does not bounce back quickly when lightly pinched and released.  Dark urine and decreased urine production.  Decreased tear production.  Headache. Severe dehydration  Very dry mouth.  Extreme thirst.  Rapid, weak pulse (more than 100 beats per minute at rest).  Cold hands and feet.  Not able to sweat in spite of heat and temperature.  Rapid breathing.  Blue lips.  Confusion and lethargy.  Difficulty being awakened.  Minimal urine production.  No tears. DIAGNOSIS  Your caregiver will diagnose dehydration based on your symptoms and your exam. Blood and urine tests will help confirm the diagnosis. The diagnostic evaluation should also identify the cause of dehydration. TREATMENT  Treatment of mild or moderate dehydration can often be done at home by increasing the amount of fluids that you drink. It is best to drink small amounts of fluid more often. Drinking too much at one time can make vomiting worse. Refer to the home care instructions below. Severe dehydration needs to be treated at the hospital where you will probably be given intravenous (IV) fluids that contain water and electrolytes. HOME CARE INSTRUCTIONS   Ask your caregiver about specific rehydration instructions.  Drink enough fluids to keep your urine clear or pale yellow.  Drink small amounts frequently if you have nausea and vomiting.  Eat as you normally do.  Avoid:  Foods or drinks high in sugar.  Carbonated  drinks.  Juice.  Extremely hot or cold fluids.  Drinks with caffeine.  Fatty, greasy foods.  Alcohol.  Tobacco.  Overeating.  Gelatin desserts.  Wash your hands well to avoid spreading bacteria and viruses.  Only take over-the-counter or prescription medicines for pain, discomfort, or fever as directed by your caregiver.  Ask your caregiver if you should continue all prescribed and over-the-counter medicines.  Keep all follow-up appointments with your caregiver. SEEK MEDICAL CARE IF:  You have abdominal pain and it increases or stays in one area (localizes).  You have a rash, stiff neck, or severe headache.  You are irritable, sleepy, or difficult to awaken.  You are weak, dizzy, or extremely thirsty. SEEK IMMEDIATE MEDICAL CARE IF:   You are unable to keep fluids down or you get worse despite treatment.  You have frequent episodes of vomiting or diarrhea.  You have blood or green matter (bile) in your vomit.  You have blood in your stool or your stool looks black and tarry.  You have not urinated in 6 to 8 hours, or you have only urinated a small amount of very dark urine.  You have a fever.  You faint. MAKE SURE YOU:   Understand these instructions.  Will watch your condition.  Will get help right away if you are not doing well or get worse. Document Released: 01/24/2005 Document Revised: 04/18/2011 Document Reviewed: 09/13/2010 ExitCare Patient Information 2015 [employer]. This information is not intended to replace advice given to you by your health care provider. Make sure you discuss any questions you have with your health care   provider.  

## 2013-11-13 NOTE — Progress Notes (Signed)
Pt's portacath accessed from yesterday. Flushes okay but unable to get blood return. Dr. Alvy Bimler notified. Okay to use portacath today for IVF despite not getting blood return per Dr. Alvy Bimler.  0845: Pt states he does not need antiemetics at this time. Informed pt to let me know if this changes as there are orders for antiemetics as needed. Pt verbalized understanding.  5732: Pt transported to radiation by radiation tech on IVF  0955: Pt returned from radiation. No needs identified.

## 2013-11-14 ENCOUNTER — Ambulatory Visit (HOSPITAL_BASED_OUTPATIENT_CLINIC_OR_DEPARTMENT_OTHER): Payer: BC Managed Care – PPO

## 2013-11-14 ENCOUNTER — Ambulatory Visit
Admission: RE | Admit: 2013-11-14 | Discharge: 2013-11-14 | Disposition: A | Discharge status: Home / Self Care | Payer: BC Managed Care – PPO | Source: Ambulatory Visit | Attending: Radiation Oncology | Admitting: Radiation Oncology

## 2013-11-14 VITALS — BP 123/78 | HR 57 | Temp 98.2°F | Resp 16

## 2013-11-14 DIAGNOSIS — R1310 Dysphagia, unspecified: Secondary | ICD-10-CM

## 2013-11-14 DIAGNOSIS — Z510 Encounter for antineoplastic radiation therapy: Secondary | ICD-10-CM | POA: Diagnosis not present

## 2013-11-14 DIAGNOSIS — R748 Abnormal levels of other serum enzymes: Secondary | ICD-10-CM

## 2013-11-14 DIAGNOSIS — R7989 Other specified abnormal findings of blood chemistry: Secondary | ICD-10-CM

## 2013-11-14 MED ORDER — SODIUM CHLORIDE 0.9 % IV SOLN
1000.0000 mL | Freq: Once | INTRAVENOUS | Status: AC
  Administered 2013-11-14: 09:00:00 via INTRAVENOUS

## 2013-11-14 MED ORDER — SODIUM CHLORIDE 0.9 % IJ SOLN
10.0000 mL | INTRAMUSCULAR | Status: DC | PRN
  Administered 2013-11-14: 10 mL
  Filled 2013-11-14: qty 10

## 2013-11-14 MED ORDER — HEPARIN SOD (PORK) LOCK FLUSH 100 UNIT/ML IV SOLN
500.0000 [IU] | Freq: Once | INTRAVENOUS | Status: AC | PRN
  Administered 2013-11-14: 500 [IU]
  Filled 2013-11-14: qty 5

## 2013-11-14 NOTE — Patient Instructions (Signed)
Dehydration, Adult Dehydration is when you lose more fluids from the body than you take in. Vital organs like the kidneys, brain, and heart cannot function without a proper amount of fluids and salt. Any loss of fluids from the body can cause dehydration.  CAUSES   Vomiting.  Diarrhea.  Excessive sweating.  Excessive urine output.  Fever. SYMPTOMS  Mild dehydration  Thirst.  Dry lips.  Slightly dry mouth. Moderate dehydration  Very dry mouth.  Sunken eyes.  Skin does not bounce back quickly when lightly pinched and released.  Dark urine and decreased urine production.  Decreased tear production.  Headache. Severe dehydration  Very dry mouth.  Extreme thirst.  Rapid, weak pulse (more than 100 beats per minute at rest).  Cold hands and feet.  Not able to sweat in spite of heat and temperature.  Rapid breathing.  Blue lips.  Confusion and lethargy.  Difficulty being awakened.  Minimal urine production.  No tears. DIAGNOSIS  Your caregiver will diagnose dehydration based on your symptoms and your exam. Blood and urine tests will help confirm the diagnosis. The diagnostic evaluation should also identify the cause of dehydration. TREATMENT  Treatment of mild or moderate dehydration can often be done at home by increasing the amount of fluids that you drink. It is best to drink small amounts of fluid more often. Drinking too much at one time can make vomiting worse. Refer to the home care instructions below. Severe dehydration needs to be treated at the hospital where you will probably be given intravenous (IV) fluids that contain water and electrolytes. HOME CARE INSTRUCTIONS   Ask your caregiver about specific rehydration instructions.  Drink enough fluids to keep your urine clear or pale yellow.  Drink small amounts frequently if you have nausea and vomiting.  Eat as you normally do.  Avoid:  Foods or drinks high in sugar.  Carbonated  drinks.  Juice.  Extremely hot or cold fluids.  Drinks with caffeine.  Fatty, greasy foods.  Alcohol.  Tobacco.  Overeating.  Gelatin desserts.  Wash your hands well to avoid spreading bacteria and viruses.  Only take over-the-counter or prescription medicines for pain, discomfort, or fever as directed by your caregiver.  Ask your caregiver if you should continue all prescribed and over-the-counter medicines.  Keep all follow-up appointments with your caregiver. SEEK MEDICAL CARE IF:  You have abdominal pain and it increases or stays in one area (localizes).  You have a rash, stiff neck, or severe headache.  You are irritable, sleepy, or difficult to awaken.  You are weak, dizzy, or extremely thirsty. SEEK IMMEDIATE MEDICAL CARE IF:   You are unable to keep fluids down or you get worse despite treatment.  You have frequent episodes of vomiting or diarrhea.  You have blood or green matter (bile) in your vomit.  You have blood in your stool or your stool looks black and tarry.  You have not urinated in 6 to 8 hours, or you have only urinated a small amount of very dark urine.  You have a fever.  You faint. MAKE SURE YOU:   Understand these instructions.  Will watch your condition.  Will get help right away if you are not doing well or get worse. Document Released: 01/24/2005 Document Revised: 04/18/2011 Document Reviewed: 09/13/2010 ExitCare Patient Information 2015 [employer]. This information is not intended to replace advice given to you by your health care provider. Make sure you discuss any questions you have with your health care   provider.  

## 2013-11-15 ENCOUNTER — Ambulatory Visit (HOSPITAL_BASED_OUTPATIENT_CLINIC_OR_DEPARTMENT_OTHER): Payer: BC Managed Care – PPO

## 2013-11-15 ENCOUNTER — Other Ambulatory Visit: Payer: Self-pay | Admitting: Hematology and Oncology

## 2013-11-15 ENCOUNTER — Other Ambulatory Visit: Payer: Self-pay | Admitting: *Deleted

## 2013-11-15 ENCOUNTER — Telehealth: Payer: Self-pay | Admitting: Hematology and Oncology

## 2013-11-15 ENCOUNTER — Ambulatory Visit (HOSPITAL_BASED_OUTPATIENT_CLINIC_OR_DEPARTMENT_OTHER): Payer: BC Managed Care – PPO | Admitting: Hematology and Oncology

## 2013-11-15 ENCOUNTER — Ambulatory Visit
Admission: RE | Admit: 2013-11-15 | Discharge: 2013-11-15 | Disposition: A | Discharge status: Home / Self Care | Payer: BC Managed Care – PPO | Source: Ambulatory Visit | Attending: Radiation Oncology | Admitting: Radiation Oncology

## 2013-11-15 ENCOUNTER — Telehealth: Payer: Self-pay | Admitting: *Deleted

## 2013-11-15 ENCOUNTER — Other Ambulatory Visit (HOSPITAL_BASED_OUTPATIENT_CLINIC_OR_DEPARTMENT_OTHER): Payer: BC Managed Care – PPO

## 2013-11-15 ENCOUNTER — Other Ambulatory Visit: Payer: Self-pay | Admitting: Radiology

## 2013-11-15 ENCOUNTER — Encounter: Payer: Self-pay | Admitting: Hematology and Oncology

## 2013-11-15 VITALS — BP 116/80 | HR 66 | Temp 98.3°F | Resp 18

## 2013-11-15 DIAGNOSIS — C01 Malignant neoplasm of base of tongue: Secondary | ICD-10-CM

## 2013-11-15 DIAGNOSIS — R748 Abnormal levels of other serum enzymes: Secondary | ICD-10-CM

## 2013-11-15 DIAGNOSIS — R110 Nausea: Secondary | ICD-10-CM | POA: Insufficient documentation

## 2013-11-15 DIAGNOSIS — Z95828 Presence of other vascular implants and grafts: Secondary | ICD-10-CM | POA: Insufficient documentation

## 2013-11-15 DIAGNOSIS — R7989 Other specified abnormal findings of blood chemistry: Secondary | ICD-10-CM

## 2013-11-15 DIAGNOSIS — Z931 Gastrostomy status: Secondary | ICD-10-CM

## 2013-11-15 DIAGNOSIS — Z510 Encounter for antineoplastic radiation therapy: Secondary | ICD-10-CM | POA: Diagnosis not present

## 2013-11-15 DIAGNOSIS — R1310 Dysphagia, unspecified: Secondary | ICD-10-CM

## 2013-11-15 LAB — CBC WITH DIFFERENTIAL/PLATELET
BASO%: 0.4 % (ref 0.0–2.0)
Basophils Absolute: 0 10*3/uL (ref 0.0–0.1)
EOS%: 2.4 % (ref 0.0–7.0)
Eosinophils Absolute: 0.1 10*3/uL (ref 0.0–0.5)
HCT: 41.3 % (ref 38.4–49.9)
HGB: 13.6 g/dL (ref 13.0–17.1)
LYMPH%: 19 % (ref 14.0–49.0)
MCH: 30.6 pg (ref 27.2–33.4)
MCHC: 33 g/dL (ref 32.0–36.0)
MCV: 92.5 fL (ref 79.3–98.0)
MONO#: 1 10*3/uL — ABNORMAL HIGH (ref 0.1–0.9)
MONO%: 19.8 % — ABNORMAL HIGH (ref 0.0–14.0)
NEUT#: 3 10*3/uL (ref 1.5–6.5)
NEUT%: 58.4 % (ref 39.0–75.0)
Platelets: 305 10*3/uL (ref 140–400)
RBC: 4.47 10*6/uL (ref 4.20–5.82)
RDW: 13.1 % (ref 11.0–14.6)
WBC: 5.2 10*3/uL (ref 4.0–10.3)
lymph#: 1 10*3/uL (ref 0.9–3.3)

## 2013-11-15 LAB — COMPREHENSIVE METABOLIC PANEL (CC13)
ALT: 20 U/L (ref 0–55)
AST: 18 U/L (ref 5–34)
Albumin: 3.4 g/dL — ABNORMAL LOW (ref 3.5–5.0)
Alkaline Phosphatase: 69 U/L (ref 40–150)
Anion Gap: 8 mEq/L (ref 3–11)
BUN: 29.6 mg/dL — ABNORMAL HIGH (ref 7.0–26.0)
CO2: 32 mEq/L — ABNORMAL HIGH (ref 22–29)
Calcium: 8.7 mg/dL (ref 8.4–10.4)
Chloride: 94 mEq/L — ABNORMAL LOW (ref 98–109)
Creatinine: 1.9 mg/dL — ABNORMAL HIGH (ref 0.7–1.3)
Glucose: 106 mg/dl (ref 70–140)
Potassium: 4.6 mEq/L (ref 3.5–5.1)
Sodium: 135 mEq/L — ABNORMAL LOW (ref 136–145)
Total Bilirubin: 1.06 mg/dL (ref 0.20–1.20)
Total Protein: 7.1 g/dL (ref 6.4–8.3)

## 2013-11-15 LAB — MAGNESIUM (CC13): Magnesium: 1.7 mg/dl (ref 1.5–2.5)

## 2013-11-15 MED ORDER — SODIUM CHLORIDE 0.9 % IJ SOLN
10.0000 mL | INTRAMUSCULAR | Status: DC | PRN
  Administered 2013-11-15: 10 mL
  Filled 2013-11-15: qty 10

## 2013-11-15 MED ORDER — SODIUM CHLORIDE 0.9 % IV SOLN
Freq: Once | INTRAVENOUS | Status: AC
  Administered 2013-11-15: 09:00:00 via INTRAVENOUS

## 2013-11-15 MED ORDER — PROMETHAZINE HCL 25 MG/ML IJ SOLN
25.0000 mg | Freq: Every day | INTRAMUSCULAR | Status: DC | PRN
  Administered 2013-11-15: 25 mg via INTRAVENOUS
  Filled 2013-11-15: qty 1

## 2013-11-15 MED ORDER — HEPARIN SOD (PORK) LOCK FLUSH 100 UNIT/ML IV SOLN
500.0000 [IU] | Freq: Once | INTRAVENOUS | Status: AC | PRN
  Administered 2013-11-15: 500 [IU]
  Filled 2013-11-15: qty 5

## 2013-11-15 NOTE — Assessment & Plan Note (Signed)
This is related to combination side effects of cisplatin and reduced oral intake. I would extend his IV fluids to tomorrow and probably next week and recheck his kidney function again next Tuesday.

## 2013-11-15 NOTE — Telephone Encounter (Signed)
Spoke with patient's wife, informed her that: 1. His PAC is being removed and PICC inserted next Tues, to arrive at [facility] at 10:30 following 9:20 RT, he should be NPO after 6 am. 2. He has Wed appt with Dr. Alvy Bimler @ 9:30, RT re-scheduled for 10:20. She verbalized understanding of information.  Gayleen Orem, RN, BSN, [place] at [place] [phone]

## 2013-11-15 NOTE — Assessment & Plan Note (Signed)
The feeding tube site looks okay with no signs of infection.

## 2013-11-15 NOTE — Telephone Encounter (Signed)
added f/u for 10/14 per 10/9 pof. lb/fu 10/13 cxd per desk nurse. per desk nurse no need to contact pt - pt will be made of change and new time by navigator. messaege to chemo scheduler to move 10/14 fluids to after f/u and xrt.

## 2013-11-15 NOTE — Telephone Encounter (Signed)
appts per 10/9 pof complete. pt given appt schedule while in inf.

## 2013-11-15 NOTE — Assessment & Plan Note (Signed)
The patient had transient elevated creatinine of the cycle 1 of chemotherapy, reversed with IV fluids. With cycle 2 of chemotherapy, he was supported with IV fluids throughout the week.  Despite our best efforts, he went into acute renal failure again. I will see him on a weekly basis and continue aggressive supportive care. If his kidney function does not reverse back to normal, we might have to abandon cycle 3 of therapy.

## 2013-11-15 NOTE — Assessment & Plan Note (Signed)
The patient is currently taking round-the-clock Zofran and Compazine and still had persistent nausea with reduced oral intake. If his symptoms do not resolve by next week, and we'll try scopolamine patch.

## 2013-11-15 NOTE — Progress Notes (Signed)
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 There are right level 2 and level 3 adenopathy as described and right peritracheal lymph node within the superior mediastinum   09/12/2013 Surgery Laryngoscopy showed the oropharynx revealed a firm friable mass involving the lateral base of tongue and inferior tonsillar fossa.    09/13/2013 Pathology Results Accession: [id number] biopsy confirmed squamous cell carcinoma, HPV positive   09/25/2013 Imaging PET scan showed right base of tongue mass exhibits intense FDG uptake compatible with primary head neck carcinoma with associated hypermetabolic right level to adenopathy   10/03/2013 Procedure He had dental extraction   10/21/2013 -  Chemotherapy He start on high dose cisplatin   11/04/2013 -  Radiation Therapy He received radiation therapy    INTERVAL HISTORY: Please see below for problem oriented charting. He is seen as part as his weekly supportive care visits. He has persistent nausea but no vomiting. He has reduced oral intake due to nausea. Denies mucositis constipation or diarrhea.  REVIEW OF SYSTEMS:   Constitutional: Denies fevers, chills or abnormal weight loss Eyes: Denies blurriness of  vision Respiratory: Denies cough, dyspnea or wheezes Cardiovascular: Denies palpitation, chest discomfort or lower extremity swelling Skin: Denies abnormal skin rashes Lymphatics: Denies new lymphadenopathy or easy bruising Neurological:Denies numbness, tingling or new weaknesses Behavioral/Psych: Mood is stable, no new changes  All other systems were reviewed with the patient and are negative.  I have reviewed the past medical history, past surgical history, social history and family history with the patient and they are unchanged from previous note.  ALLERGIES:  is allergic to codeine and velosef.  MEDICATIONS:  Current Outpatient Prescriptions  Medication Sig Dispense Refill  . hyaluronate sodium (RADIAPLEXRX) GEL Apply 1 application topically 2 (two) times daily.      Marland Kitchen HYDROcodone-acetaminophen (NORCO) 7.5-325 MG per tablet Take 1-2 tablets by mouth every 4 (four) hours as needed for moderate pain.  50 tablet  0  . lidocaine-prilocaine (EMLA) cream Apply 1 application topically as needed (per port-a-cath).      . ondansetron (ZOFRAN) 8 MG tablet Take 8 mg by mouth every 8 (eight) hours as needed for nausea or vomiting.      . prochlorperazine (COMPAZINE) 10 MG tablet Take 10 mg by mouth every 6 (six) hours as needed for nausea or vomiting.      . promethazine (PHENERGAN) 25 MG suppository Place 25 mg rectally every 6 (six) hours as needed for nausea or vomiting.      . sodium fluoride (FLUORISHIELD) 1.1 % GEL dental gel Place 1 application onto teeth at bedtime.      . traMADol (ULTRAM) 50 MG tablet Take 50 mg by mouth every 6 (six) hours as  needed for moderate pain.       No current facility-administered medications for this visit.   Facility-Administered Medications Ordered in Other Visits  Medication Dose Route Frequency Provider Last Rate Last Dose  . heparin lock flush 100 unit/mL  500 Units Intracatheter Once PRN Heath Lark, MD      . promethazine (PHENERGAN) injection 25 mg   25 mg Intravenous Daily PRN Heath Lark, MD   25 mg at 11/15/13 0839  . sodium chloride 0.9 % injection 10 mL  10 mL Intracatheter PRN Heath Lark, MD        PHYSICAL EXAMINATION: ECOG PERFORMANCE STATUS: 1 - Symptomatic but completely ambulatory  Filed Vitals:   11/15/13 0825  BP: 116/80  Pulse: 66  Temp: 98.3 F (36.8 C)  Resp: 18   There were no vitals filed for this visit.  GENERAL:alert, no distress and comfortable SKIN: skin color, texture, turgor are normal, no rashes or significant lesions EYES: normal, Conjunctiva are pink and non-injected, sclera clear OROPHARYNX:no exudate, no erythema and lips, buccal mucosa, and tongue normal . Dry mucous membranes noted NECK: supple, thyroid normal size, non-tender, without nodularity. Previously enlarged lymph node has regressed in size LYMPH:  no palpable lymphadenopathy in the cervical, axillary or inguinal LUNGS: clear to auscultation and percussion with normal breathing effort HEART: regular rate & rhythm and no murmurs and no lower extremity edema ABDOMEN:abdomen soft, non-tender and normal bowel sounds Musculoskeletal:no cyanosis of digits and no clubbing  NEURO: alert & oriented x 3 with fluent speech, no focal motor/sensory deficits  LABORATORY DATA:  I have reviewed the data as listed    Component Value Date/Time   NA 135* 11/15/2013 0810   NA 139 10/16/2013 1257   K 4.6 11/15/2013 0810   K 4.4 10/16/2013 1257   CL 102 10/16/2013 1257   CO2 32* 11/15/2013 0810   CO2 24 10/16/2013 1257   GLUCOSE 106 11/15/2013 0810   GLUCOSE 98 10/16/2013 1257   BUN 29.6* 11/15/2013 0810   BUN 14 10/16/2013 1257   CREATININE 1.9* 11/15/2013 0810   CREATININE 1.12 10/17/2013 1857   CALCIUM 8.7 11/15/2013 0810   CALCIUM 9.3 10/16/2013 1257   PROT 7.1 11/15/2013 0810   ALBUMIN 3.4* 11/15/2013 0810   AST 18 11/15/2013 0810   ALT 20 11/15/2013 0810   ALKPHOS 69 11/15/2013 0810   BILITOT 1.06 11/15/2013 0810   GFRNONAA 71* 10/17/2013 1857   GFRAA 82*  10/17/2013 1857    No results found for this basename: SPEP, UPEP,  kappa and lambda light chains    Lab Results  Component Value Date   WBC 5.2 11/15/2013   NEUTROABS 3.0 11/15/2013   HGB 13.6 11/15/2013   HCT 41.3 11/15/2013   MCV 92.5 11/15/2013   PLT 305 11/15/2013      Chemistry      Component Value Date/Time   NA 135* 11/15/2013 0810   NA 139 10/16/2013 1257   K 4.6 11/15/2013 0810   K 4.4 10/16/2013 1257   CL 102 10/16/2013 1257   CO2 32* 11/15/2013 0810   CO2 24 10/16/2013 1257   BUN 29.6* 11/15/2013 0810   BUN 14 10/16/2013 1257   CREATININE 1.9* 11/15/2013 0810   CREATININE 1.12 10/17/2013 1857      Component Value Date/Time   CALCIUM 8.7 11/15/2013 0810   CALCIUM 9.3 10/16/2013 1257   ALKPHOS 69 11/15/2013 0810   AST 18 11/15/2013 0810   ALT 20 11/15/2013 0810   BILITOT  1.06 11/15/2013 0810      ASSESSMENT & PLAN:  Squamous cell carcinoma of the right base of tongue The patient had transient elevated creatinine of the cycle 1 of chemotherapy, reversed with IV fluids. With cycle 2 of chemotherapy, he was supported with IV fluids throughout the week.  Despite our best efforts, he went into acute renal failure again. I will see him on a weekly basis and continue aggressive supportive care. If his kidney function does not reverse back to normal, we might have to abandon cycle 3 of therapy.  Elevated serum creatinine This is related to combination side effects of cisplatin and reduced oral intake. I would extend his IV fluids to tomorrow and probably next week and recheck his kidney function again next Tuesday.  S/P gastrostomy The feeding tube site looks okay with no signs of infection.   Port catheter in place Unfortunately, we failed to get venous return and that has to be replaced. I have extensive discussion with the nurse manager and I will contact his surgeon for port replacement.  Nausea without vomiting The patient is currently taking round-the-clock Zofran and Compazine  and still had persistent nausea with reduced oral intake. If his symptoms do not resolve by next week, and we'll try scopolamine patch.   No orders of the defined types were placed in this encounter.   All questions were answered. The patient knows to call the clinic with any problems, questions or concerns. No barriers to learning was detected. I spent 30 minutes counseling the patient face to face. The total time spent in the appointment was 40 minutes and more than 50% was on counseling and review of test results     [facility], [place], MD 11/15/2013 10:45 AM

## 2013-11-15 NOTE — Patient Instructions (Signed)
Dehydration, Adult Dehydration is when you lose more fluids from the body than you take in. Vital organs like the kidneys, brain, and heart cannot function without a proper amount of fluids and salt. Any loss of fluids from the body can cause dehydration.  CAUSES   Vomiting.  Diarrhea.  Excessive sweating.  Excessive urine output.  Fever. SYMPTOMS  Mild dehydration  Thirst.  Dry lips.  Slightly dry mouth. Moderate dehydration  Very dry mouth.  Sunken eyes.  Skin does not bounce back quickly when lightly pinched and released.  Dark urine and decreased urine production.  Decreased tear production.  Headache. Severe dehydration  Very dry mouth.  Extreme thirst.  Rapid, weak pulse (more than 100 beats per minute at rest).  Cold hands and feet.  Not able to sweat in spite of heat and temperature.  Rapid breathing.  Blue lips.  Confusion and lethargy.  Difficulty being awakened.  Minimal urine production.  No tears. DIAGNOSIS  Your caregiver will diagnose dehydration based on your symptoms and your exam. Blood and urine tests will help confirm the diagnosis. The diagnostic evaluation should also identify the cause of dehydration. TREATMENT  Treatment of mild or moderate dehydration can often be done at home by increasing the amount of fluids that you drink. It is best to drink small amounts of fluid more often. Drinking too much at one time can make vomiting worse. Refer to the home care instructions below. Severe dehydration needs to be treated at the hospital where you will probably be given intravenous (IV) fluids that contain water and electrolytes. HOME CARE INSTRUCTIONS   Ask your caregiver about specific rehydration instructions.  Drink enough fluids to keep your urine clear or pale yellow.  Drink small amounts frequently if you have nausea and vomiting.  Eat as you normally do.  Avoid:  Foods or drinks high in sugar.  Carbonated  drinks.  Juice.  Extremely hot or cold fluids.  Drinks with caffeine.  Fatty, greasy foods.  Alcohol.  Tobacco.  Overeating.  Gelatin desserts.  Wash your hands well to avoid spreading bacteria and viruses.  Only take over-the-counter or prescription medicines for pain, discomfort, or fever as directed by your caregiver.  Ask your caregiver if you should continue all prescribed and over-the-counter medicines.  Keep all follow-up appointments with your caregiver. SEEK MEDICAL CARE IF:  You have abdominal pain and it increases or stays in one area (localizes).  You have a rash, stiff neck, or severe headache.  You are irritable, sleepy, or difficult to awaken.  You are weak, dizzy, or extremely thirsty. SEEK IMMEDIATE MEDICAL CARE IF:   You are unable to keep fluids down or you get worse despite treatment.  You have frequent episodes of vomiting or diarrhea.  You have blood or green matter (bile) in your vomit.  You have blood in your stool or your stool looks black and tarry.  You have not urinated in 6 to 8 hours, or you have only urinated a small amount of very dark urine.  You have a fever.  You faint. MAKE SURE YOU:   Understand these instructions.  Will watch your condition.  Will get help right away if you are not doing well or get worse. Document Released: 01/24/2005 Document Revised: 04/18/2011 Document Reviewed: 09/13/2010 ExitCare Patient Information 2015 [employer]. This information is not intended to replace advice given to you by your health care provider. Make sure you discuss any questions you have with your health care   provider.  

## 2013-11-15 NOTE — Assessment & Plan Note (Signed)
Unfortunately, we failed to get venous return and that has to be replaced. I have extensive discussion with the nurse manager and I will contact his surgeon for port replacement.

## 2013-11-16 ENCOUNTER — Ambulatory Visit (HOSPITAL_BASED_OUTPATIENT_CLINIC_OR_DEPARTMENT_OTHER): Payer: BC Managed Care – PPO

## 2013-11-16 VITALS — BP 119/73 | HR 62 | Temp 98.4°F | Resp 18

## 2013-11-16 DIAGNOSIS — C01 Malignant neoplasm of base of tongue: Secondary | ICD-10-CM

## 2013-11-16 DIAGNOSIS — R748 Abnormal levels of other serum enzymes: Secondary | ICD-10-CM

## 2013-11-16 DIAGNOSIS — R7989 Other specified abnormal findings of blood chemistry: Secondary | ICD-10-CM

## 2013-11-16 DIAGNOSIS — R1310 Dysphagia, unspecified: Secondary | ICD-10-CM

## 2013-11-16 MED ORDER — SODIUM CHLORIDE 0.9 % IV SOLN
Freq: Once | INTRAVENOUS | Status: AC
  Administered 2013-11-16: 10:00:00 via INTRAVENOUS

## 2013-11-16 NOTE — Patient Instructions (Signed)
Dehydration, Adult Dehydration is when you lose more fluids from the body than you take in. Vital organs like the kidneys, brain, and heart cannot function without a proper amount of fluids and salt. Any loss of fluids from the body can cause dehydration.  CAUSES   Vomiting.  Diarrhea.  Excessive sweating.  Excessive urine output.  Fever. SYMPTOMS  Mild dehydration  Thirst.  Dry lips.  Slightly dry mouth. Moderate dehydration  Very dry mouth.  Sunken eyes.  Skin does not bounce back quickly when lightly pinched and released.  Dark urine and decreased urine production.  Decreased tear production.  Headache. Severe dehydration  Very dry mouth.  Extreme thirst.  Rapid, weak pulse (more than 100 beats per minute at rest).  Cold hands and feet.  Not able to sweat in spite of heat and temperature.  Rapid breathing.  Blue lips.  Confusion and lethargy.  Difficulty being awakened.  Minimal urine production.  No tears. DIAGNOSIS  Your caregiver will diagnose dehydration based on your symptoms and your exam. Blood and urine tests will help confirm the diagnosis. The diagnostic evaluation should also identify the cause of dehydration. TREATMENT  Treatment of mild or moderate dehydration can often be done at home by increasing the amount of fluids that you drink. It is best to drink small amounts of fluid more often. Drinking too much at one time can make vomiting worse. Refer to the home care instructions below. Severe dehydration needs to be treated at the hospital where you will probably be given intravenous (IV) fluids that contain water and electrolytes. HOME CARE INSTRUCTIONS   Ask your caregiver about specific rehydration instructions.  Drink enough fluids to keep your urine clear or pale yellow.  Drink small amounts frequently if you have nausea and vomiting.  Eat as you normally do.  Avoid:  Foods or drinks high in sugar.  Carbonated  drinks.  Juice.  Extremely hot or cold fluids.  Drinks with caffeine.  Fatty, greasy foods.  Alcohol.  Tobacco.  Overeating.  Gelatin desserts.  Wash your hands well to avoid spreading bacteria and viruses.  Only take over-the-counter or prescription medicines for pain, discomfort, or fever as directed by your caregiver.  Ask your caregiver if you should continue all prescribed and over-the-counter medicines.  Keep all follow-up appointments with your caregiver. SEEK MEDICAL CARE IF:  You have abdominal pain and it increases or stays in one area (localizes).  You have a rash, stiff neck, or severe headache.  You are irritable, sleepy, or difficult to awaken.  You are weak, dizzy, or extremely thirsty. SEEK IMMEDIATE MEDICAL CARE IF:   You are unable to keep fluids down or you get worse despite treatment.  You have frequent episodes of vomiting or diarrhea.  You have blood or green matter (bile) in your vomit.  You have blood in your stool or your stool looks black and tarry.  You have not urinated in 6 to 8 hours, or you have only urinated a small amount of very dark urine.  You have a fever.  You faint. MAKE SURE YOU:   Understand these instructions.  Will watch your condition.  Will get help right away if you are not doing well or get worse. Document Released: 01/24/2005 Document Revised: 04/18/2011 Document Reviewed: 09/13/2010 ExitCare Patient Information 2015 [employer]. This information is not intended to replace advice given to you by your health care provider. Make sure you discuss any questions you have with your health care   provider.  

## 2013-11-17 ENCOUNTER — Encounter: Payer: Self-pay | Admitting: *Deleted

## 2013-11-17 NOTE — Progress Notes (Signed)
Met with patient and his wife in Infusion to provide support and continuity s/p last Friday's appt with Dr. Alvy Bimler.    Gayleen Orem, RN, BSN, [place] at [place] [phone]

## 2013-11-18 ENCOUNTER — Encounter (HOSPITAL_COMMUNITY): Payer: Self-pay | Admitting: Pharmacy Technician

## 2013-11-18 ENCOUNTER — Ambulatory Visit (HOSPITAL_COMMUNITY): Payer: Medicaid - Dental | Admitting: Dentistry

## 2013-11-18 ENCOUNTER — Ambulatory Visit (HOSPITAL_BASED_OUTPATIENT_CLINIC_OR_DEPARTMENT_OTHER): Payer: BC Managed Care – PPO

## 2013-11-18 ENCOUNTER — Other Ambulatory Visit: Payer: Self-pay | Admitting: Radiology

## 2013-11-18 ENCOUNTER — Encounter: Payer: Self-pay | Admitting: *Deleted

## 2013-11-18 ENCOUNTER — Ambulatory Visit
Admission: RE | Admit: 2013-11-18 | Discharge: 2013-11-18 | Disposition: A | Discharge status: Home / Self Care | Payer: BC Managed Care – PPO | Source: Ambulatory Visit | Attending: Radiation Oncology | Admitting: Radiation Oncology

## 2013-11-18 ENCOUNTER — Encounter (HOSPITAL_COMMUNITY): Payer: Self-pay | Admitting: Dentistry

## 2013-11-18 VITALS — BP 113/77 | HR 70 | Temp 97.6°F

## 2013-11-18 VITALS — BP 115/76 | HR 71 | Temp 98.2°F | Wt 207.0 lb

## 2013-11-18 VITALS — BP 115/80 | HR 66 | Temp 98.1°F | Resp 20 | Wt 201.7 lb

## 2013-11-18 DIAGNOSIS — R748 Abnormal levels of other serum enzymes: Secondary | ICD-10-CM

## 2013-11-18 DIAGNOSIS — R432 Parageusia: Secondary | ICD-10-CM

## 2013-11-18 DIAGNOSIS — K1233 Oral mucositis (ulcerative) due to radiation: Secondary | ICD-10-CM

## 2013-11-18 DIAGNOSIS — R1310 Dysphagia, unspecified: Secondary | ICD-10-CM

## 2013-11-18 DIAGNOSIS — R682 Dry mouth, unspecified: Secondary | ICD-10-CM

## 2013-11-18 DIAGNOSIS — Z510 Encounter for antineoplastic radiation therapy: Secondary | ICD-10-CM | POA: Diagnosis not present

## 2013-11-18 DIAGNOSIS — R7989 Other specified abnormal findings of blood chemistry: Secondary | ICD-10-CM

## 2013-11-18 DIAGNOSIS — C01 Malignant neoplasm of base of tongue: Secondary | ICD-10-CM

## 2013-11-18 DIAGNOSIS — K117 Disturbances of salivary secretion: Secondary | ICD-10-CM

## 2013-11-18 DIAGNOSIS — R634 Abnormal weight loss: Secondary | ICD-10-CM

## 2013-11-18 DIAGNOSIS — R29898 Other symptoms and signs involving the musculoskeletal system: Secondary | ICD-10-CM

## 2013-11-18 MED ORDER — SODIUM CHLORIDE 0.9 % IV SOLN
Freq: Once | INTRAVENOUS | Status: AC
  Administered 2013-11-18: 14:00:00 via INTRAVENOUS

## 2013-11-18 NOTE — Progress Notes (Signed)
To provide support and encouragement, care continuity and to assess for needs,  met with patient and his wife during [place] tmt and then during weekly UT with Dr. Valere Dross: 1. Provided clarification of tomorrow's schedule for PAC removal/PICC insertion.   2. Relayed verbal order from Dr. Alvy Bimler to RN Manuela Schwartz, [place] Short Stay, to facilitate patient's receipt of 1L NS IVF while he is in Short Stay.  3. I inquired about their reported difficulty using PEG and the need to use plunger for instillation rather than gravity.  Using Teach Back and return demonstration, wife correctly/successfully conducted water flush via gravity instillation.  4. Patient reported absence of taste/appetite.   5. In addressing weight loss over the past week, patient encourage by RN, Dr. Valere Dross and Navigator to begin instillation of additional nutritional supplement.  Patient will address nutritional practices during tomorrow morning's appt with dietician. No additional needs expressed, patient/wife understand they can contact me with needs/concerns.  Gayleen Orem, RN, BSN, [place] at [place] [phone]

## 2013-11-18 NOTE — Progress Notes (Signed)
11/18/2013  Patient Name:   [name] Wu Date of Birth:   Dec 02, 1955 Medical Record Number: [id number]  BP 115/76  Pulse 71  Temp(Src) 98.2 F (36.8 C) (Oral)  Wt 207 lb (93.895 kg)  [name] presents for oral examination during radiation therapy. Patient has completed 10/35 radiation treatments. Patient has completed 2 cycles of chemotherapy. Patient scheduled to have PICC line placed tomorrow.  REVIEW OF CHIEF COMPLAINTS:  DRY MOUTH: Yes HARD TO SWALLOW: Yes   HURT TO SWALLOW: Yes TASTE CHANGES: No taste that all SORES IN MOUTH: Denies sores in his mouth TRISMUS: Denies problems with trismus WEIGHT: 207 pounds  HOME OH REGIMEN:  BRUSHING: Twice a day FLOSSING: Once a day RINSING: Rinsing with salt water and baking soda as well as Biotene rinses FLUORIDE: Use fluoride at bedtime in trays TRISMUS EXERCISES:  Maximum interincisal opening: 40 mm. Patient doing exercises twice a day.   DENTAL EXAM:  Oral Hygiene:(PLAQUE): Good oral hygiene LOCATION OF MUCOSITIS: Back of throat inflammation and erythema DESCRIPTION OF SALIVA: Decreased and foamy. Moderate xerostomia ANY EXPOSED BONE: Mandibular right area #31. There is a 2 x 2 mm round area of exposed bone. OTHER WATCHED AREAS: #31 area as mentioned above. DX: Xerostomia, Dysgeusia, Dysphagia, Odynophagia, Weight Loss, Mucositis and Exposed mandible area #31  RECOMMENDATIONS: 1. Brush after meals and at bedtime.  Use fluoride at bedtime. 2. Use trismus exercises as directed. 3. Use Biotene Rinse or salt water/baking soda rinses. 4. Multiple sips of water as needed. 5. Return to clinic in two months for oral exam after radiation therapy. 6. Follow exposed bone on the lower right lingual aspect of the mandible. Referral back oral surgeon as indicated. 7. Keep up with Dr. Oneida Arenas in early March of 2016 as scheduled. Call if problems before then.  Lenn Cal, DDS

## 2013-11-18 NOTE — Progress Notes (Addendum)
Patient states he has "slightly sore throat". He is taking Hydrocodone prn. He reports loss of appetite and ongoing nausea despite taking Compazine, Zofran regularly. He has Phenergan suppositories but refuses to take them. He is requesting Phenergan tabs. Advised he can take nightly since they make him sleepy. Pt saw Dr Enrique Sack this morning, has nutrition appointment tomorrow. He has lost 9 lbs in past week. His wife has been using peg tube as needed, giving him soup, water and Ensure. Advised her that nutritionist will "prescribe" certain number of nutritional supplements daily to give pt full calorie/nutrition needs. Pt states he is no longer eating any foods due to loss of taste and nausea. He is applying lotion to neck treatment area, some darkening of skin.

## 2013-11-18 NOTE — Progress Notes (Signed)
Weekly Management Note:  Site: Right tonsil/base of tongue Current Dose:  2200  cGy Projected Dose: 7000  cGy  Narrative: The patient is seen today for routine under treatment assessment. CBCT/MVCT images/port films were reviewed. The chart was reviewed.   He is having more throat discomfort and takes hydrocodone when necessary. His appetite is poor and he does take Compazine and Zofran. He has lost his sense of taste. He saw Dr. Enrique Sack this morning. He has lost 9 pounds over the past week. He is getting all of his nutrition through his PEG tube. He will see Nutrition this week. Chemotherapy went well last week except for nausea.  Physical Examination:  Filed Vitals:   11/18/13 0959  BP: 115/80  Pulse: 66  Temp: 98.1 F (36.7 C)  Resp: 20  .  Weight: 201 lb 11.2 oz (91.491 kg). There is mild hyperpigmentation the skin along the neck. There has been further slight regression of his right neck adenopathy. Oral cavity and oropharynx there is mild mucositis along the oropharynx, right greater than left. No candidiasis there  Laboratory data: Lab Results  Component Value Date   WBC 5.2 11/15/2013   HGB 13.6 11/15/2013   HCT 41.3 11/15/2013   MCV 92.5 11/15/2013   PLT 305 11/15/2013     Impression: Tolerating radiation therapy well.  Plan: Continue radiation therapy as planned.

## 2013-11-18 NOTE — Patient Instructions (Signed)
RECOMMENDATIONS: 1. Brush after meals and at bedtime.  Use fluoride at bedtime. 2. Use trismus exercises as directed. 3. Use Biotene Rinse or salt water/baking soda rinses. 4. Multiple sips of water as needed. 5. Return to clinic in two months for oral exam after radiation therapy. 6. Follow exposed bone on the lower right lingual aspect of the mandible. Referral back oral surgeon as indicated. 7. Keep up with Dr. Oneida Arenas in early March of 2016 as scheduled. Call if problems before then.  Lenn Cal, DDS

## 2013-11-18 NOTE — Patient Instructions (Signed)
Dehydration, Adult Dehydration is when you lose more fluids from the body than you take in. Vital organs like the kidneys, brain, and heart cannot function without a proper amount of fluids and salt. Any loss of fluids from the body can cause dehydration.  CAUSES   Vomiting.  Diarrhea.  Excessive sweating.  Excessive urine output.  Fever. SYMPTOMS  Mild dehydration  Thirst.  Dry lips.  Slightly dry mouth. Moderate dehydration  Very dry mouth.  Sunken eyes.  Skin does not bounce back quickly when lightly pinched and released.  Dark urine and decreased urine production.  Decreased tear production.  Headache. Severe dehydration  Very dry mouth.  Extreme thirst.  Rapid, weak pulse (more than 100 beats per minute at rest).  Cold hands and feet.  Not able to sweat in spite of heat and temperature.  Rapid breathing.  Blue lips.  Confusion and lethargy.  Difficulty being awakened.  Minimal urine production.  No tears. DIAGNOSIS  Your caregiver will diagnose dehydration based on your symptoms and your exam. Blood and urine tests will help confirm the diagnosis. The diagnostic evaluation should also identify the cause of dehydration. TREATMENT  Treatment of mild or moderate dehydration can often be done at home by increasing the amount of fluids that you drink. It is best to drink small amounts of fluid more often. Drinking too much at one time can make vomiting worse. Refer to the home care instructions below. Severe dehydration needs to be treated at the hospital where you will probably be given intravenous (IV) fluids that contain water and electrolytes. HOME CARE INSTRUCTIONS   Ask your caregiver about specific rehydration instructions.  Drink enough fluids to keep your urine clear or pale yellow.  Drink small amounts frequently if you have nausea and vomiting.  Eat as you normally do.  Avoid:  Foods or drinks high in sugar.  Carbonated  drinks.  Juice.  Extremely hot or cold fluids.  Drinks with caffeine.  Fatty, greasy foods.  Alcohol.  Tobacco.  Overeating.  Gelatin desserts.  Wash your hands well to avoid spreading bacteria and viruses.  Only take over-the-counter or prescription medicines for pain, discomfort, or fever as directed by your caregiver.  Ask your caregiver if you should continue all prescribed and over-the-counter medicines.  Keep all follow-up appointments with your caregiver. SEEK MEDICAL CARE IF:  You have abdominal pain and it increases or stays in one area (localizes).  You have a rash, stiff neck, or severe headache.  You are irritable, sleepy, or difficult to awaken.  You are weak, dizzy, or extremely thirsty. SEEK IMMEDIATE MEDICAL CARE IF:   You are unable to keep fluids down or you get worse despite treatment.  You have frequent episodes of vomiting or diarrhea.  You have blood or green matter (bile) in your vomit.  You have blood in your stool or your stool looks black and tarry.  You have not urinated in 6 to 8 hours, or you have only urinated a small amount of very dark urine.  You have a fever.  You faint. MAKE SURE YOU:   Understand these instructions.  Will watch your condition.  Will get help right away if you are not doing well or get worse. Document Released: 01/24/2005 Document Revised: 04/18/2011 Document Reviewed: 09/13/2010 ExitCare Patient Information 2015 [employer]. This information is not intended to replace advice given to you by your health care provider. Make sure you discuss any questions you have with your health care   provider.  

## 2013-11-18 NOTE — Progress Notes (Signed)
Rick from [facility] called and stated patient was to get 1L of Normal Saline at [facility] tomorrow. He requested if when he is here at Short Stay tomorrow (pre and post IR procedures) would he be able to get this fluid so he would not need to go to [facility] for appt in the afternoon. Verbal order placed in computer for 1L NS to be infused tomorrow while in Short Stay.

## 2013-11-19 ENCOUNTER — Encounter (HOSPITAL_COMMUNITY): Payer: Self-pay

## 2013-11-19 ENCOUNTER — Ambulatory Visit (HOSPITAL_COMMUNITY)
Admission: RE | Admit: 2013-11-19 | Discharge: 2013-11-19 | Disposition: A | Discharge status: Home / Self Care | Payer: BC Managed Care – PPO | Source: Ambulatory Visit | Attending: Hematology and Oncology | Admitting: Hematology and Oncology

## 2013-11-19 ENCOUNTER — Ambulatory Visit: Payer: Self-pay | Admitting: Hematology and Oncology

## 2013-11-19 ENCOUNTER — Other Ambulatory Visit: Payer: Self-pay | Admitting: Hematology and Oncology

## 2013-11-19 ENCOUNTER — Ambulatory Visit: Payer: BC Managed Care – PPO | Admitting: Nutrition

## 2013-11-19 ENCOUNTER — Ambulatory Visit: Payer: Self-pay

## 2013-11-19 ENCOUNTER — Other Ambulatory Visit: Payer: Self-pay

## 2013-11-19 ENCOUNTER — Ambulatory Visit
Admission: RE | Admit: 2013-11-19 | Discharge: 2013-11-19 | Disposition: A | Discharge status: Home / Self Care | Payer: BC Managed Care – PPO | Source: Ambulatory Visit | Attending: Radiation Oncology | Admitting: Radiation Oncology

## 2013-11-19 VITALS — BP 127/89 | HR 70 | Temp 98.8°F | Resp 12 | Ht 65.0 in | Wt 201.0 lb

## 2013-11-19 DIAGNOSIS — Y848 Other medical procedures as the cause of abnormal reaction of the patient, or of later complication, without mention of misadventure at the time of the procedure: Secondary | ICD-10-CM | POA: Insufficient documentation

## 2013-11-19 DIAGNOSIS — E46 Unspecified protein-calorie malnutrition: Secondary | ICD-10-CM

## 2013-11-19 DIAGNOSIS — R1310 Dysphagia, unspecified: Secondary | ICD-10-CM

## 2013-11-19 DIAGNOSIS — G4733 Obstructive sleep apnea (adult) (pediatric): Secondary | ICD-10-CM | POA: Insufficient documentation

## 2013-11-19 DIAGNOSIS — Z79891 Long term (current) use of opiate analgesic: Secondary | ICD-10-CM | POA: Diagnosis not present

## 2013-11-19 DIAGNOSIS — Z510 Encounter for antineoplastic radiation therapy: Secondary | ICD-10-CM | POA: Diagnosis not present

## 2013-11-19 DIAGNOSIS — Z79899 Other long term (current) drug therapy: Secondary | ICD-10-CM | POA: Diagnosis not present

## 2013-11-19 DIAGNOSIS — T451X5A Adverse effect of antineoplastic and immunosuppressive drugs, initial encounter: Secondary | ICD-10-CM | POA: Insufficient documentation

## 2013-11-19 DIAGNOSIS — C01 Malignant neoplasm of base of tongue: Secondary | ICD-10-CM | POA: Insufficient documentation

## 2013-11-19 DIAGNOSIS — N289 Disorder of kidney and ureter, unspecified: Secondary | ICD-10-CM | POA: Insufficient documentation

## 2013-11-19 DIAGNOSIS — T82514A Breakdown (mechanical) of infusion catheter, initial encounter: Secondary | ICD-10-CM | POA: Insufficient documentation

## 2013-11-19 LAB — CBC WITH DIFFERENTIAL/PLATELET
Basophils Absolute: 0 10*3/uL (ref 0.0–0.1)
Basophils Relative: 0 % (ref 0–1)
Eosinophils Absolute: 0 10*3/uL (ref 0.0–0.7)
Eosinophils Relative: 0 % (ref 0–5)
HCT: 35.8 % — ABNORMAL LOW (ref 39.0–52.0)
Hemoglobin: 12.6 g/dL — ABNORMAL LOW (ref 13.0–17.0)
Lymphocytes Relative: 13 % (ref 12–46)
Lymphs Abs: 0.6 10*3/uL — ABNORMAL LOW (ref 0.7–4.0)
MCH: 30.7 pg (ref 26.0–34.0)
MCHC: 35.2 g/dL (ref 30.0–36.0)
MCV: 87.3 fL (ref 78.0–100.0)
Monocytes Absolute: 0.8 10*3/uL (ref 0.1–1.0)
Monocytes Relative: 17 % — ABNORMAL HIGH (ref 3–12)
Neutro Abs: 3.3 10*3/uL (ref 1.7–7.7)
Neutrophils Relative %: 69 % (ref 43–77)
Platelets: 103 10*3/uL — ABNORMAL LOW (ref 150–400)
RBC: 4.1 MIL/uL — ABNORMAL LOW (ref 4.22–5.81)
RDW: 12.5 % (ref 11.5–15.5)
WBC: 4.8 10*3/uL (ref 4.0–10.5)

## 2013-11-19 LAB — COMPREHENSIVE METABOLIC PANEL
ALT: 18 U/L (ref 0–53)
AST: 17 U/L (ref 0–37)
Albumin: 3.7 g/dL (ref 3.5–5.2)
Alkaline Phosphatase: 71 U/L (ref 39–117)
Anion gap: 14 (ref 5–15)
BUN: 28 mg/dL — ABNORMAL HIGH (ref 6–23)
CO2: 29 mEq/L (ref 19–32)
Calcium: 8.4 mg/dL (ref 8.4–10.5)
Chloride: 92 mEq/L — ABNORMAL LOW (ref 96–112)
Creatinine, Ser: 1.78 mg/dL — ABNORMAL HIGH (ref 0.50–1.35)
GFR calc Af Amer: 47 mL/min — ABNORMAL LOW (ref >90)
GFR calc non Af Amer: 40 mL/min — ABNORMAL LOW (ref >90)
Glucose, Bld: 106 mg/dL — ABNORMAL HIGH (ref 70–99)
Potassium: 3.4 mEq/L — ABNORMAL LOW (ref 3.7–5.3)
Sodium: 135 mEq/L — ABNORMAL LOW (ref 137–147)
Total Bilirubin: 0.6 mg/dL (ref 0.3–1.2)
Total Protein: 7.3 g/dL (ref 6.0–8.3)

## 2013-11-19 LAB — MAGNESIUM: Magnesium: 1.5 mg/dL (ref 1.5–2.5)

## 2013-11-19 LAB — PROTIME-INR
INR: 1.01 (ref 0.00–1.49)
Prothrombin Time: 13.4 seconds (ref 11.6–15.2)

## 2013-11-19 MED ORDER — OSMOLITE 1.5 CAL PO LIQD: ORAL | Status: DC

## 2013-11-19 MED ORDER — FENTANYL CITRATE 0.05 MG/ML IJ SOLN
INTRAMUSCULAR | Status: AC | PRN
  Administered 2013-11-19 (×2): 50 ug via INTRAVENOUS

## 2013-11-19 MED ORDER — ONDANSETRON HCL 4 MG/2ML IJ SOLN
INTRAMUSCULAR | Status: AC
  Filled 2013-11-19: qty 2

## 2013-11-19 MED ORDER — LIDOCAINE HCL 1 % IJ SOLN
INTRAMUSCULAR | Status: DC
Start: 2013-11-19 — End: 2013-11-19
  Filled 2013-11-19: qty 20

## 2013-11-19 MED ORDER — LIDOCAINE-EPINEPHRINE 2 %-1:100000 IJ SOLN
INTRAMUSCULAR | Status: AC
  Filled 2013-11-19: qty 1

## 2013-11-19 MED ORDER — SODIUM CHLORIDE 0.9 % IV SOLN
INTRAVENOUS | Status: DC
  Administered 2013-11-19: 11:00:00 via INTRAVENOUS

## 2013-11-19 MED ORDER — FENTANYL CITRATE 0.05 MG/ML IJ SOLN
INTRAMUSCULAR | Status: AC
  Filled 2013-11-19: qty 6

## 2013-11-19 MED ORDER — ONDANSETRON HCL 8 MG PO TABS
8.0000 mg | ORAL_TABLET | Freq: Three times a day (TID) | ORAL | Status: DC | PRN
Start: 2013-11-19 — End: 2014-07-24

## 2013-11-19 MED ORDER — VANCOMYCIN HCL IN DEXTROSE 1-5 GM/200ML-% IV SOLN
1000.0000 mg | Freq: Once | INTRAVENOUS | Status: AC
  Administered 2013-11-19: 1000 mg via INTRAVENOUS
  Filled 2013-11-19: qty 200

## 2013-11-19 MED ORDER — ONDANSETRON HCL 4 MG/2ML IJ SOLN
INTRAMUSCULAR | Status: AC | PRN
  Administered 2013-11-19: 4 mg via INTRAVENOUS

## 2013-11-19 MED ORDER — PROCHLORPERAZINE MALEATE 10 MG PO TABS: 10.0000 mg | ORAL_TABLET | Freq: Four times a day (QID) | ORAL | Status: DC | PRN

## 2013-11-19 MED ORDER — LIDOCAINE HCL 1 % IJ SOLN
INTRAMUSCULAR | Status: AC
  Filled 2013-11-19: qty 20

## 2013-11-19 MED ORDER — MIDAZOLAM HCL 2 MG/2ML IJ SOLN
INTRAMUSCULAR | Status: AC
  Filled 2013-11-19: qty 6

## 2013-11-19 MED ORDER — SODIUM CHLORIDE 0.9 % IV SOLN: INTRAVENOUS | Status: DC

## 2013-11-19 MED ORDER — HEPARIN SOD (PORK) LOCK FLUSH 100 UNIT/ML IV SOLN
INTRAVENOUS | Status: AC
  Filled 2013-11-19: qty 5

## 2013-11-19 MED ORDER — MIDAZOLAM HCL 2 MG/2ML IJ SOLN
INTRAMUSCULAR | Status: AC | PRN
  Administered 2013-11-19 (×2): 1 mg via INTRAVENOUS

## 2013-11-19 MED ORDER — OXYCODONE HCL 10 MG PO TABS: 10.0000 mg | ORAL_TABLET | ORAL | Status: DC | PRN

## 2013-11-19 NOTE — Discharge Instructions (Signed)
PICC Home Guide A peripherally inserted central catheter (PICC) is a long, thin, flexible tube that is inserted into a vein in the upper arm. It is a form of intravenous (IV) access. It is considered to be a "central" line because the tip of the PICC ends in a large vein in your chest. This large vein is called the superior vena cava (SVC). The PICC tip ends in the SVC because there is a lot of blood flow in the SVC. This allows medicines and IV fluids to be quickly distributed throughout the body. The PICC is inserted using a sterile technique by a specially trained nurse or physician. After the PICC is inserted, a chest X-ray exam is done to be sure it is in the correct place.  A PICC may be placed for different reasons, such as:  To give medicines and liquid nutrition that can only be given through a central line. Examples are:  Certain antibiotic treatments.  Chemotherapy.  Total parenteral nutrition (TPN).  To take frequent blood samples.  To give IV fluids and blood products.  If there is difficulty placing a peripheral intravenous (PIV) catheter. If taken care of properly, a PICC can remain in place for several months. A PICC can also allow a person to go home from the hospital early. Medicine and PICC care can be managed at home by a family member or home health care team. WHAT PROBLEMS CAN HAPPEN WHEN I HAVE A PICC? Problems with a PICC can occasionally occur. These may include the following:  A blood clot (thrombus) forming in or at the tip of the PICC. This can cause the PICC to become clogged. A clot-dissolving medicine called tissue plasminogen activator (tPA) can be given through the PICC to help break up the clot.  Inflammation of the vein (phlebitis) in which the PICC is placed. Signs of inflammation may include redness, pain at the insertion site, red streaks, or being able to feel a "cord" in the vein where the PICC is located.  Infection in the PICC or at the insertion  site. Signs of infection may include fever, chills, redness, swelling, or pus drainage from the PICC insertion site.  PICC movement (malposition). The PICC tip may move from its original position due to excessive physical activity, forceful coughing, sneezing, or vomiting.  A break or cut in the PICC. It is important to not use scissors near the PICC.  Nerve or tendon irritation or injury during PICC insertion. WHAT SHOULD I KEEP IN MIND ABOUT ACTIVITIES WHEN I HAVE A PICC?  You may bend your arm and move it freely. If your PICC is near or at the bend of your elbow, avoid activity with repeated motion at the elbow.  Rest at home for the remainder of the day following PICC line insertion.  Avoid lifting heavy objects as instructed by your health care provider.  Avoid using a crutch with the arm on the same side as your PICC. You may need to use a walker. WHAT SHOULD I KNOW ABOUT MY PICC DRESSING?  Keep your PICC bandage (dressing) clean and dry to prevent infection.  Ask your health care provider when you may shower. Ask your health care provider to teach you how to wrap the PICC when you do take a shower.  Change the PICC dressing as instructed by your health care provider.  Change your PICC dressing if it becomes loose or wet. WHAT SHOULD I KNOW ABOUT PICC CARE?  Check the PICC insertion site   daily for leakage, redness, swelling, or pain.  Do not take a bath, swim, or use hot tubs when you have a PICC. Cover PICC line with clear plastic wrap and tape to keep it dry while showering.  Flush the PICC as directed by your health care provider. Let your health care provider know right away if the PICC is difficult to flush or does not flush. Do not use force to flush the PICC.  Do not use a syringe that is less than 10 mL to flush the PICC.  Never pull or tug on the PICC.  Avoid blood pressure checks on the arm with the PICC.  Keep your PICC identification card with you at all  times.  Do not take the PICC out yourself. Only a trained clinical professional should remove the PICC. SEEK IMMEDIATE MEDICAL CARE IF:  Your PICC is accidentally pulled all the way out. If this happens, cover the insertion site with a bandage or gauze dressing. Do not throw the PICC away. Your health care provider will need to inspect it.  Your PICC was tugged or pulled and has partially come out. Do not  push the PICC back in.  There is any type of drainage, redness, or swelling where the PICC enters the skin.  You cannot flush the PICC, it is difficult to flush, or the PICC leaks around the insertion site when it is flushed.  You hear a "flushing" sound when the PICC is flushed.  You have pain, discomfort, or numbness in your arm, shoulder, or jaw on the same side as the PICC.  You feel your heart "racing" or skipping beats.  You notice a hole or tear in the PICC.  You develop chills or a fever. MAKE SURE YOU:   Understand these instructions.  Will watch your condition.  Will get help right away if you are not doing well or get worse. Document Released: 07/31/2002 Document Revised: 06/10/2013 Document Reviewed: 10/01/2012 [facility] Patient Information 2015 [place], [place]. This information is not intended to replace advice given to you by your health care provider. Make sure you discuss any questions you have with your health care provider.  PICC Insertion, Care After Refer to this sheet in the next few weeks. These instructions provide you with information on caring for yourself after your procedure. Your health care provider may also give you more specific instructions. Your treatment has been planned according to current medical practices, but problems sometimes occur. Call your health care provider if you have any problems or questions after your procedure. WHAT TO EXPECT AFTER THE PROCEDURE After your procedure, it is typical to have the following:  Mild discomfort at the  insertion site. This should not last more than a day. HOME CARE INSTRUCTIONS  Rest at home for the remainder of the day after the procedure.  You may bend your arm and move it freely. If your PICC is near or at the bend of your elbow, avoid activity with repeated motion at the elbow.  Avoid lifting heavy objects as instructed by your health care provider.  Avoid using a crutch with the arm on the same side as your PICC. You may need to use a walker. Bandage Care  Keep your PICC bandage (dressing) clean and dry to prevent infection.  Ask your health care provider when you may shower. To keep the dressing dry, cover the PICC with plastic wrap and tape before showering. If the dressing does become wet, replace it right after the  shower.  Do not soak in the bath, swim, or use hot tubs when you have a PICC.  Change the PICC dressing as instructed by your health care provider.  Change your PICC dressing if it becomes loose or wet. General PICC Care  Check the PICC insertion site daily for leakage, redness, swelling, or pain.  Flush the PICC as directed by your health care provider. Let your health care provider know right away if the PICC is difficult to flush or does not flush. Do not use force to flush the PICC.  Do not use a syringe that is less than 10 mL to flush the PICC.  Never pull or tug on the PICC.  Avoid blood pressure checks on the arm with the PICC.  Keep your PICC identification card with you at all times.  Do not take the PICC out yourself. Only a trained health care professional should remove the PICC. SEEK MEDICAL CARE IF:  You have pain in your arm, ear, face, or teeth.  You have fever or chills.  You have drainage from the PICC insertion site.  You have redness or palpate a "cord" around the PICC insertion site.  You cannot flush the catheter. SEEK IMMEDIATE MEDICAL CARE IF:  You have swelling in the arm in which the PICC is inserted. Document Released:  11/14/2012 Document Revised: 01/29/2013 Document Reviewed: 11/14/2012 [facility] Patient Information 2015 [place], [place]. This information is not intended to replace advice given to you by your health care provider. Make sure you discuss any questions you have with your health care provider.  Incision Care An incision is when a surgeon cuts into your body tissues. After surgery, the incision needs to be cared for properly to prevent infection.  HOME CARE INSTRUCTIONS   Take all medicine as directed by your caregiver. Only take over-the-counter or prescription medicines for pain, discomfort, or fever as directed by your caregiver.  Do not remove your bandage (dressing) or get your incision wet until your surgeon gives you permission. In the event that your dressing becomes wet, dirty, or starts to smell, change the dressing and call your surgeon for instructions as soon as possible.  Take showers. Do not take tub baths, swim, or do anything that may soak the wound until it is healed.  Resume your normal diet and activities as directed or allowed.  Avoid lifting any weight until you are instructed otherwise.  Use anti-itch antihistamine medicine as directed by your caregiver. The wound may itch when it is healing. Do not pick or scratch at the wound.  Follow up with your caregiver for stitch (suture) or staple removal as directed.  Drink enough fluids to keep your urine clear or pale yellow. SEEK MEDICAL CARE IF:   You have redness, swelling, or increasing pain in the wound that is not controlled with medicine.  You have drainage, blood, or pus coming from the wound that lasts longer than 1 day.  You develop muscle aches, chills, or a general ill feeling.  You notice a bad smell coming from the wound or dressing.  Your wound edges separate after the sutures, staples, or skin adhesive strips have been removed.  You develop persistent nausea or vomiting. SEEK IMMEDIATE MEDICAL CARE IF:     You have a fever.  You develop a rash.  You develop dizzy episodes or faint while standing.  You have difficulty breathing.  You develop any reaction or side effects to medicine given. MAKE SURE YOU:   Understand  these instructions.  Will watch your condition.  Will get help right away if you are not doing well or get worse. Document Released: 08/13/2004 Document Revised: 04/18/2011 Document Reviewed: 03/20/2013 [facility] Patient Information 2015 [place], [place]. This information is not intended to replace advice given to you by your health care provider. Make sure you discuss any questions you have with your health care provider.  Conscious Sedation, Adult, Care After Refer to this sheet in the next few weeks. These instructions provide you with information on caring for yourself after your procedure. Your health care provider may also give you more specific instructions. Your treatment has been planned according to current medical practices, but problems sometimes occur. Call your health care provider if you have any problems or questions after your procedure. WHAT TO EXPECT AFTER THE PROCEDURE  After your procedure:  You may feel sleepy, clumsy, and have poor balance for several hours.  Vomiting may occur if you eat too soon after the procedure. HOME CARE INSTRUCTIONS  Do not participate in any activities where you could become injured for at least 24 hours. Do not:  Drive.  Swim.  Ride a bicycle.  Operate heavy machinery.  Cook.  Use power tools.  Climb ladders.  Work from a high place.  Do not make important decisions or sign legal documents until you are improved.  If you vomit, drink water, juice, or soup when you can drink without vomiting. Make sure you have little or no nausea before eating solid foods.  Only take over-the-counter or prescription medicines for pain, discomfort, or fever as directed by your health care provider.  Make sure you and your  family fully understand everything about the medicines given to you, including what side effects may occur.  You should not drink alcohol, take sleeping pills, or take medicines that cause drowsiness for at least 24 hours.  If you smoke, do not smoke without supervision.  If you are feeling better, you may resume normal activities 24 hours after you were sedated.  Keep all appointments with your health care provider. SEEK MEDICAL CARE IF:  Your skin is pale or bluish in color.  You continue to feel nauseous or vomit.  Your pain is getting worse and is not helped by medicine.  You have bleeding or swelling.  You are still sleepy or feeling clumsy after 24 hours. SEEK IMMEDIATE MEDICAL CARE IF:  You develop a rash.  You have difficulty breathing.  You develop any type of allergic problem.  You have a fever. MAKE SURE YOU:  Understand these instructions.  Will watch your condition.  Will get help right away if you are not doing well or get worse. Document Released: 11/14/2012 Document Reviewed: 11/14/2012 [facility] Patient Information 2015 [place], [place]. This information is not intended to replace advice given to you by your health care provider. Make sure you discuss any questions you have with your health care provider.

## 2013-11-19 NOTE — H&P (Signed)
Chief Complaint: "I am here to get my port removed and a new catheter placed for chemotherapy."  Referring Physician(s): Gorsuch,Ni  History of Present Illness: [name] Wu is a 58 y.o. male with squamous cell carcinoma of base of tongue he had a left sided port a catheter placed by surgery 10/17/13 that has been malfunctioning. Port a catheter injection on 10/25/13 revealed fibrin sheath and kinking of catheter. Patient and oncology requested port removal and PICC placement for remaining chemotherapy. He denies any chest pain, shortness of breath or palpitations. He denies any active signs of bleeding or excessive bruising. He denies any recent fever or chills. The patient admits to history of OSA and uses CPAP. He has previously tolerated sedation without complications.    Past Medical History  Diagnosis Date  . Amnesia     after being hitting head playing baseball, lasted 1 night  . OSA on CPAP   . Tuberculosis 1984    S/P military stay in [place]; [place] x 108yrs; "cleared"  . History of stomach ulcers ~ 1983  . Malignant neoplasm of base of tongue 09/13/13    right base of tongue- inv squamous cell     Past Surgical History  Procedure Laterality Date  . Appendectomy  ~ 1967    at [facility]  . Interphalangeal joint arthroplasty Right ` 2008    "middle"  . Heel spur surgery Left ~ 1995    "shaved bone down; took nerve out"  . Chest wall tumor excision Left 1977  . Vasectomy  1982  . Direct laryngoscopy N/A 09/12/2013    Procedure: DIRECT LARYNGOSCOPY WITH BIOPSY ;  Surgeon: Izora Gala, MD;  Location: [facility];  Service: ENT;  Laterality: N/A;  . Esophagoscopy N/A 09/12/2013    Procedure: ESOPHAGOSCOPY;  Surgeon: Izora Gala, MD;  Location: [facility];  Service: ENT;  Laterality: N/A;  . Portacath placement Left 10/17/2013    power port  . Gastrostomy tube placement Left 10/17/2013  . Foot fracture surgery Left ~ 1995  . Tongue biopsy  09/2013    "@ base of tongue"  . Portacath placement Left  10/17/2013    Procedure: INSERTION PORT-A-CATH;  Surgeon: Stark Klein, MD;  Location: [facility];  Service: General;  Laterality: Left;  Johann Capers N/A 10/17/2013    Procedure: LAPAROSCOPIC GASTROJEJUNOSTOMY;  Surgeon: Stark Klein, MD;  Location: [facility];  Service: General;  Laterality: N/A;  Dr. Clyda Greener card was used please make a preference card for Dr. Barry Dienes.    Allergies: Codeine and Velosef  Medications: Prior to Admission medications   Medication Sig Start Date End Date Taking? Authorizing Provider  hyaluronate sodium (RADIAPLEXRX) GEL Apply 1 application topically 2 (two) times daily.   Yes Historical Provider, MD  HYDROcodone-acetaminophen (NORCO) 7.5-325 MG per tablet Take 1-2 tablets by mouth every 4 (four) hours as needed for moderate pain. 10/18/13  Yes Stark Klein, MD  lidocaine-prilocaine (EMLA) cream Apply 1 application topically as needed (per port-a-cath).   Yes Historical Provider, MD  ondansetron (ZOFRAN) 8 MG tablet Take 8 mg by mouth every 8 (eight) hours as needed for nausea or vomiting.   Yes Historical Provider, MD  prochlorperazine (COMPAZINE) 10 MG tablet Take 10 mg by mouth every 6 (six) hours as needed for nausea or vomiting.   Yes Historical Provider, MD  sodium fluoride (FLUORISHIELD) 1.1 % GEL dental gel Place 1 application onto teeth at bedtime.   Yes Historical Provider, MD  Nutritional Supplements (FEEDING SUPPLEMENT, OSMOLITE 1.5  CAL,) LIQD Begin 1 can Osmolite 1.5 QID with 60 ml free water before and after bolus feeding.  Increase to 1.5 cans QID on day 2.  Increase to goal of 1.5 cans BID and 2 cans BID day 3.  Flush with additional 240 cc water TID between feedings. Send formula please. 11/19/13   Heath Lark, MD    Family History  Problem Relation Age of Onset  . Cancer Paternal Grandfather     History   Social History  . Marital Status: Married    Spouse Name: N/A    Number of Children: 45  . Years of Education: N/A   Occupational History  .   [employer]   Social History Main Topics  . Smoking status: Never Smoker   . Smokeless tobacco: Never Used  . Alcohol Use: No  . Drug Use: No  . Sexual Activity: Yes   Other Topics Concern  . None   Social History Narrative   09/24/2013   Patient is married.   Patient has 2 children from his previous marriage and 3 stepchildren from this married. Patient has been married to his current wife for approximately 2 years.   Patient has never smoked, never used smokeless tobacco, and indicates that he does not use illicit drugs or alcohol.    Review of Systems: A 12 point ROS discussed and pertinent positives are indicated in the HPI above.  All other systems are negative.  Review of Systems  Vital Signs: BP 110/83  Pulse 75  Temp(Src) 98.7 F (37.1 C) (Oral)  Resp 18  Ht $R'5\' 5"'OS$  (1.651 m)  Wt 201 lb (91.173 kg)  BMI 33.45 kg/m2  SpO2 100%  Physical Exam  Constitutional: He is oriented to person, place, and time. No distress.  HENT:  Head: Normocephalic and atraumatic.  Neck: No tracheal deviation present.  Cardiovascular: Normal rate and regular rhythm.  Exam reveals no gallop and no friction rub.   No murmur heard. Pulmonary/Chest: Effort normal and breath sounds normal. No respiratory distress. He has no wheezes. He has no rales.  Abdominal: Soft. Bowel sounds are normal. He exhibits no distension. There is no tenderness.  Gastrostomy tube intact  Neurological: He is alert and oriented to person, place, and time.  Skin: Skin is warm and dry. He is not diaphoretic. No erythema.  Left sided port intact and palpated without signs changes of erythema or skin breakdown.   Psychiatric: He has a normal mood and affect. His behavior is normal. Thought content normal.    Imaging: Ir Cv Line Injection  10/25/2013   CLINICAL DATA:  History of head and neck carcinoma. Indwelling left-sided Port-A-Cath demonstrates difficulty flushing during treatment.  EXAM:  CONTRAST INJECTION OF PORT A CATH UNDER FLUOROSCOPY  CONTRAST:  10 mL Omnipaque 300  FLUOROSCOPY TIME:  12 seconds.  PROCEDURE: Initial fluoroscopy was performed of the catheter including oblique projections. Fluoroscopic spot images were saved. Contrast was administered via the indwelling port after it was accessed. Fluoroscopic spot images were obtained of the catheter during injection.  FINDINGS: Fluoroscopy shows a single lumen left subclavian Port-A-Cath with the catheter tip projecting over the upper SVC. Lateral aspect of the catheter in the infraclavicular region does show an area of mild kinking as demonstrated on the postprocedural x-ray immediately after placement on 10/17/2013.  Initial attempt at saline injection was extremely difficult with resistance met. Utilizing a smaller syringe, contrast was able to be injected into the catheter. The port reservoir and  catheter tubing are intact without evidence of extravasation. There is persistent focal kinking of the catheter immediately inferior to the clavicle. The tip of the catheter is located in the upper SVC. Contrast injection does demonstrate fibrin sheath material around the tip of the catheter with contrast exiting primarily around the sheath rather than from the true tip of the catheter and eventually flowing into the SVC.  IMPRESSION: 1. Port-A-Cath positioning with catheter tip in the upper SVC. There is significant fibrin sheath around the tip of the catheter without complete obstruction of the catheter. 2. Kinking of the catheter in the subcutaneous tissue in the infraclavicular region. This is not obstructive but may account for some of the difficulty flushing the catheter. 3. No evidence of catheter disruption or contrast extravasation.   Electronically Signed   By: Aletta Edouard M.D.   On: 10/25/2013 11:38    Labs:  CBC:  Recent Labs  11/04/13 1125 11/08/13 0822 11/15/13 0809 11/19/13 1105  WBC 5.8 4.0 5.2 4.8  HGB 12.7* 13.1  13.6 12.6*  HCT 37.9* 39.8 41.3 35.8*  PLT 373 547* 305 PENDING    COAGS:  Recent Labs  11/19/13 1105  INR 1.01    BMP:  Recent Labs  09/09/13 1339  10/16/13 1257 10/17/13 1857 10/30/13 0957 11/04/13 1125 11/08/13 0822 11/15/13 0810  NA 142  < > 139  --  139 140 139 135*  K 4.9  < > 4.4  --  4.0 4.3 4.4 4.6  CL 106  --  102  --   --   --   --   --   CO2 26  < > 24  --  30* 28 29 32*  GLUCOSE 91  < > 98  --  111 100 119 106  BUN 21  < > 14  --  27.7* 23.3 19.6 29.6*  CALCIUM 9.4  < > 9.3  --  9.5 9.4 9.3 8.7  CREATININE 1.41*  < > 1.19 1.12 1.6* 1.1 1.2 1.9*  GFRNONAA 53*  --  66* 71*  --   --   --   --   GFRAA 62*  --  76* 82*  --   --   --   --   < > = values in this interval not displayed.  LIVER FUNCTION TESTS:  Recent Labs  10/30/13 0957 11/04/13 1125 11/08/13 0822 11/15/13 0810  BILITOT 0.45 0.54 0.53 1.06  AST $Re'13 14 12 18  'LVG$ ALT $R'18 14 16 20  'lQ$ ALKPHOS 70 62 73 69  PROT 7.1 6.7 6.9 7.1  ALBUMIN 3.2* 3.0* 3.2* 3.4*   Assessment and Plan: Squamous cell carcinoma of base of tongue Left sided port a catheter placed by surgery 10/17/13 malfunctioning  Port a catheter injection on 10/25/13 revealed fibrin sheath and kinking of catheter Patient and oncology requested port removal and PICC placement for remaining chemotherapy. Patient has been NPO, no blood thinners taken, labs reviewed Risks and Benefits discussed with the patient. All of the patient's questions were answered, patient is agreeable to proceed. Consent signed and in chart. 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 Alvy Bimler OSA uses CPAP.    SignedHedy Jacob 11/19/2013, 11:33 AM

## 2013-11-19 NOTE — Progress Notes (Signed)
Nutrition followup completed with patient and wife.  Patient has had a 5% weight loss in one week with weight documented as 201 pounds on October 12, down from 210.7 pounds October 5.   Patient is unable to eat by mouth.  He is only tolerating sips of water at this time.  Patient needs enteral nutrition to provide greater than 90% estimated nutrition needs. Patient continues to receive IV fluids after chemotherapy. Patient reports some nausea and vomiting.  Estimated nutrition needs: 2300-2500 calories, 130-140 g protein, 2.6 L fluid.   Nutrition diagnosis: Predicted suboptimal energy intake has evolved into inadequate oral intake related to tongue cancer and associated treatments as evidenced by approximately 10 pound weight loss in approximately one week.  Intervention: Begin Osmolite 1.5 one can 4 times a day with 60 cc free water before and after bolus feedings.  Drink or flush feeding tube with 240 cc free water 3 times a day between bolus feedings. Increase Osmolite 1.5 to 1-1/2 cans 4 times a day as tolerated on day 2. Increase to goal rate of 2 cans twice a day +1-1/2 cans twice a day of Osmolite 1.5 on day 3 to provide 2485 calories, 104 g protein. Patient and wife instructed on tube feeding.  Orders written and [facility] notified.  Written fact sheets provided. After tube feeding tolerance established, will add additional protein to meet minimum estimated protein needs.  Monitoring, evaluation, goals: Patient will tolerate bolus feedings to meet greater than 90% of estimated nutrition needs.  Next visit: Tuesday, November 20.   **Disclaimer: This note was dictated with voice recognition software. Similar sounding words can inadvertently be transcribed and this note may contain transcription errors which may not have been corrected upon publication of note.**

## 2013-11-19 NOTE — Procedures (Signed)
Procedures:  Left port removal and right arm PICC placement Findings:  Left port removed without difficulty.  Right arm 41 cm DL PICC placed with tip at cavoatrial jucntion. No complications.

## 2013-11-19 NOTE — H&P (Signed)
Agree.  Patient seen.  For left port removal and right arm PICC placement today.

## 2013-11-20 ENCOUNTER — Ambulatory Visit (HOSPITAL_BASED_OUTPATIENT_CLINIC_OR_DEPARTMENT_OTHER): Payer: BC Managed Care – PPO | Admitting: Hematology and Oncology

## 2013-11-20 ENCOUNTER — Ambulatory Visit
Admission: RE | Admit: 2013-11-20 | Discharge: 2013-11-20 | Disposition: A | Discharge status: Home / Self Care | Payer: BC Managed Care – PPO | Source: Ambulatory Visit | Attending: Radiation Oncology | Admitting: Radiation Oncology

## 2013-11-20 ENCOUNTER — Telehealth: Payer: Self-pay | Admitting: *Deleted

## 2013-11-20 ENCOUNTER — Encounter: Payer: Self-pay | Admitting: Hematology and Oncology

## 2013-11-20 ENCOUNTER — Ambulatory Visit (HOSPITAL_BASED_OUTPATIENT_CLINIC_OR_DEPARTMENT_OTHER): Payer: BC Managed Care – PPO

## 2013-11-20 ENCOUNTER — Telehealth: Payer: Self-pay | Admitting: Hematology and Oncology

## 2013-11-20 ENCOUNTER — Other Ambulatory Visit: Payer: Self-pay | Admitting: *Deleted

## 2013-11-20 VITALS — BP 110/74 | HR 70 | Temp 98.5°F | Resp 18

## 2013-11-20 DIAGNOSIS — C01 Malignant neoplasm of base of tongue: Secondary | ICD-10-CM

## 2013-11-20 DIAGNOSIS — R7989 Other specified abnormal findings of blood chemistry: Secondary | ICD-10-CM

## 2013-11-20 DIAGNOSIS — R110 Nausea: Secondary | ICD-10-CM

## 2013-11-20 DIAGNOSIS — R748 Abnormal levels of other serum enzymes: Secondary | ICD-10-CM

## 2013-11-20 DIAGNOSIS — Z95828 Presence of other vascular implants and grafts: Secondary | ICD-10-CM | POA: Insufficient documentation

## 2013-11-20 DIAGNOSIS — R1310 Dysphagia, unspecified: Secondary | ICD-10-CM

## 2013-11-20 DIAGNOSIS — R070 Pain in throat: Secondary | ICD-10-CM

## 2013-11-20 DIAGNOSIS — Z510 Encounter for antineoplastic radiation therapy: Secondary | ICD-10-CM | POA: Diagnosis not present

## 2013-11-20 DIAGNOSIS — E46 Unspecified protein-calorie malnutrition: Secondary | ICD-10-CM

## 2013-11-20 MED ORDER — ONDANSETRON 8 MG/50ML IVPB (CHCC): 8.0000 mg | Freq: Every day | INTRAVENOUS | Status: DC | PRN

## 2013-11-20 MED ORDER — HEPARIN SOD (PORK) LOCK FLUSH 100 UNIT/ML IV SOLN
250.0000 [IU] | Freq: Once | INTRAVENOUS | Status: AC | PRN
  Administered 2013-11-20: 10:00:00
  Filled 2013-11-20: qty 5

## 2013-11-20 MED ORDER — METOCLOPRAMIDE HCL 5 MG/5ML PO SOLN
10.0000 mg | Freq: Three times a day (TID) | ORAL | Status: DC
Start: 2013-11-20 — End: 2014-01-07

## 2013-11-20 MED ORDER — SODIUM CHLORIDE FLUSH 0.9 % IV SOLN
10.0000 mL | Freq: Every day | INTRAVENOUS | Status: DC
Start: 2013-11-20 — End: 2014-02-04

## 2013-11-20 MED ORDER — FENTANYL 25 MCG/HR TD PT72: 25.0000 ug | MEDICATED_PATCH | TRANSDERMAL | Status: DC

## 2013-11-20 MED ORDER — SODIUM CHLORIDE 0.9 % IV SOLN
1000.0000 mL | Freq: Once | INTRAVENOUS | Status: AC
  Administered 2013-11-20: 08:00:00 via INTRAVENOUS

## 2013-11-20 MED ORDER — SODIUM CHLORIDE 0.9 % IJ SOLN
10.0000 mL | INTRAMUSCULAR | Status: DC | PRN
  Administered 2013-11-20: 10 mL
  Filled 2013-11-20: qty 10

## 2013-11-20 MED ORDER — PROMETHAZINE HCL 25 MG/ML IJ SOLN
25.0000 mg | Freq: Every day | INTRAMUSCULAR | Status: DC | PRN
Start: 2013-11-20 — End: 2013-11-20
  Filled 2013-11-20: qty 1

## 2013-11-20 MED ORDER — SCOPOLAMINE 1 MG/3DAYS TD PT72: 1.0000 | MEDICATED_PATCH | TRANSDERMAL | Status: DC

## 2013-11-20 NOTE — Assessment & Plan Note (Signed)
This is getting worse. He has been seen by a dietitian who recommended increase tube feeds.

## 2013-11-20 NOTE — Assessment & Plan Note (Signed)
I recommend a trial of Reglan before meals. If this is not improving, I recommend him to try scopolamine patch

## 2013-11-20 NOTE — Assessment & Plan Note (Signed)
This has improved slightly with IV fluids. I recommend we continue IV fluids daily next week.

## 2013-11-20 NOTE — Assessment & Plan Note (Signed)
He is experiencing worsening side effects of treatment. Continued aggressive supportive care.

## 2013-11-20 NOTE — Progress Notes (Signed)
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 There are right level 2 and level 3 adenopathy as described and right peritracheal lymph node within the superior mediastinum   09/12/2013 Surgery Laryngoscopy showed the oropharynx revealed a firm friable mass involving the lateral base of tongue and inferior tonsillar fossa.    09/13/2013 Pathology Results Accession: [id number] biopsy confirmed squamous cell carcinoma, HPV positive   09/25/2013 Imaging PET scan showed right base of tongue mass exhibits intense FDG uptake compatible with primary head neck carcinoma with associated hypermetabolic right level to adenopathy   10/03/2013 Procedure He had dental extraction   10/21/2013 -  Chemotherapy He start on high dose cisplatin   11/04/2013 -  Radiation Therapy He received radiation therapy   11/19/2013 Procedure He had placement of right PICC line and removal of port    INTERVAL HISTORY: Please see below for problem oriented charting. He is getting weaker and is having more difficulties maintaining oral intake. He has started to use his feeding tube more frequently. He has some nausea but no vomiting. Denies constipation. He had increasing sore throat.  REVIEW OF  SYSTEMS:   Constitutional: Denies fevers, chills or abnormal weight loss Eyes: Denies blurriness of vision Respiratory: Denies cough, dyspnea or wheezes Cardiovascular: Denies palpitation, chest discomfort or lower extremity swelling Skin: Denies abnormal skin rashes Lymphatics: Denies new lymphadenopathy or easy bruising Neurological:Denies numbness, tingling or new weaknesses Behavioral/Psych: Mood is stable, no new changes  All other systems were reviewed with the patient and are negative.  I have reviewed the past medical history, past surgical history, social history and family history with the patient and they are unchanged from previous note.  ALLERGIES:  is allergic to codeine and velosef.  MEDICATIONS:  Current Outpatient Prescriptions  Medication Sig Dispense Refill  . fentaNYL (DURAGESIC - DOSED MCG/HR) 25 MCG/HR patch Place 1 patch (25 mcg total) onto the skin every 3 (three) days.  5 patch  0  . hyaluronate sodium (RADIAPLEXRX) GEL Apply 1 application topically 2 (two) times daily.      Marland Kitchen HYDROcodone-acetaminophen (NORCO) 7.5-325 MG per tablet Take 1-2 tablets by mouth every 4 (four) hours as needed for moderate pain.  50 tablet  0  . lidocaine-prilocaine (EMLA) cream Apply 1 application topically as needed (per port-a-cath).      . metoCLOPramide (REGLAN) 5 MG/5ML solution Take 10 mLs (10 mg total) by mouth 4 (four) times daily -  before meals and at bedtime.  480 mL  3  . Nutritional Supplements (FEEDING SUPPLEMENT, OSMOLITE 1.5 CAL,) LIQD Begin 1 can Osmolite 1.5 QID with 60 ml free water before and after bolus feeding.  Increase to 1.5 cans QID on day 2.  Increase to goal of 1.5 cans BID and 2 cans BID day 3.  Flush with additional 240 cc water TID between feedings. Send formula please.  1659 mL  0  . ondansetron (ZOFRAN) 8 MG tablet Take 1 tablet (8 mg total) by mouth every 8 (eight) hours as needed for nausea or vomiting.  60 tablet  6  . oxyCODONE 10 MG TABS Take 1 tablet  (10 mg total) by mouth every 4 (four) hours as needed for severe pain.  60 tablet  0  . prochlorperazine (COMPAZINE) 10 MG tablet Take 1 tablet (10 mg total) by mouth every 6 (six) hours as needed for nausea or vomiting.  60 tablet  3  . scopolamine (TRANSDERM-SCOP) 1 MG/3DAYS Place 1 patch (1.5 mg total) onto the skin every 3 (three) days.  10 patch  12  . Sodium Chloride Flush 0.9 % SOLN injection Inject 10 mLs into the vein daily.  100 Syringe  3  . sodium fluoride (FLUORISHIELD) 1.1 % GEL dental gel Place 1 application onto teeth at bedtime.       No current facility-administered medications for this visit.    PHYSICAL EXAMINATION: ECOG PERFORMANCE STATUS: 1 - Symptomatic but completely ambulatory GENERAL:alert, no distress and comfortable SKIN: skin color, texture, turgor are normal, no rashes or significant lesions EYES: normal, Conjunctiva are pink and non-injected, sclera clear OROPHARYNX:no exudate, no erythema and lips, buccal mucosa, and tongue normal  NECK: supple, thyroid normal size, non-tender, without nodularity LYMPH:  no palpable lymphadenopathy in the cervical, axillary or inguinal. Previously palpable lymphadenopathy has regressed in size LUNGS: clear to auscultation and percussion with normal breathing effort HEART: regular rate & rhythm and no murmurs and no lower extremity edema ABDOMEN:abdomen soft, non-tender and normal bowel sounds. Feeding tube site looks okay Musculoskeletal:no cyanosis of digits and no clubbing  NEURO: alert & oriented x 3 with fluent speech, no focal motor/sensory deficits  LABORATORY DATA:  I have reviewed the data as listed    Component Value Date/Time   NA 135* 11/19/2013 1105   NA 135* 11/15/2013 0810   K 3.4* 11/19/2013 1105   K 4.6 11/15/2013 0810   CL 92* 11/19/2013 1105   CO2 29 11/19/2013 1105   CO2 32* 11/15/2013 0810   GLUCOSE 106* 11/19/2013 1105   GLUCOSE 106 11/15/2013 0810   BUN 28* 11/19/2013 1105   BUN 29.6* 11/15/2013  0810   CREATININE 1.78* 11/19/2013 1105   CREATININE 1.9* 11/15/2013 0810   CALCIUM 8.4 11/19/2013 1105   CALCIUM 8.7 11/15/2013 0810   PROT 7.3 11/19/2013 1105   PROT 7.1 11/15/2013 0810   ALBUMIN 3.7 11/19/2013 1105   ALBUMIN 3.4* 11/15/2013 0810   AST 17 11/19/2013 1105   AST 18 11/15/2013 0810   ALT 18 11/19/2013 1105   ALT 20 11/15/2013 0810   ALKPHOS 71 11/19/2013 1105   ALKPHOS 69 11/15/2013 0810   BILITOT 0.6 11/19/2013 1105   BILITOT 1.06 11/15/2013 0810   GFRNONAA 40* 11/19/2013 1105   GFRAA 47* 11/19/2013 1105    No results found for this basename: SPEP, UPEP,  kappa and lambda light chains    Lab Results  Component Value Date   WBC 4.8 11/19/2013   NEUTROABS 3.3 11/19/2013   HGB 12.6* 11/19/2013   HCT 35.8* 11/19/2013   MCV 87.3 11/19/2013   PLT 103* 11/19/2013      Chemistry      Component Value Date/Time   NA 135* 11/19/2013 1105   NA  135* 11/15/2013 0810   K 3.4* 11/19/2013 1105   K 4.6 11/15/2013 0810   CL 92* 11/19/2013 1105   CO2 29 11/19/2013 1105   CO2 32* 11/15/2013 0810   BUN 28* 11/19/2013 1105   BUN 29.6* 11/15/2013 0810   CREATININE 1.78* 11/19/2013 1105   CREATININE 1.9* 11/15/2013 0810      Component Value Date/Time   CALCIUM 8.4 11/19/2013 1105   CALCIUM 8.7 11/15/2013 0810   ALKPHOS 71 11/19/2013 1105   ALKPHOS 69 11/15/2013 0810   AST 17 11/19/2013 1105   AST 18 11/15/2013 0810   ALT 18 11/19/2013 1105   ALT 20 11/15/2013 0810   BILITOT 0.6 11/19/2013 1105   BILITOT 1.06 11/15/2013 0810       ASSESSMENT & PLAN:  Squamous cell carcinoma of the right base of tongue He is experiencing worsening side effects of treatment. Continued aggressive supportive care.  Elevated serum creatinine This has improved slightly with IV fluids. I recommend we continue IV fluids daily next week.  S/P gastrostomy The feeding tube site looks okay with no signs of infection.     S/P PICC central line placement Recommend PICC line care with  flushes.  Nausea without vomiting I recommend a trial of Reglan before meals. If this is not improving, I recommend him to try scopolamine patch  Throat pain in adult This is getting more severe. Recommend addition of fentanyl patch.  Protein calorie malnutrition This is getting worse. He has been seen by a dietitian who recommended increase tube feeds.   No orders of the defined types were placed in this encounter.   All questions were answered. The patient knows to call the clinic with any problems, questions or concerns. No barriers to learning was detected. I spent 30 minutes counseling the patient face to face. The total time spent in the appointment was 40 minutes and more than 50% was on counseling and review of test results     [facility], [place], MD 11/20/2013 10:16 PM

## 2013-11-20 NOTE — Assessment & Plan Note (Signed)
The feeding tube site looks okay with no signs of infection.

## 2013-11-20 NOTE — Assessment & Plan Note (Signed)
This is getting more severe. Recommend addition of fentanyl patch.

## 2013-11-20 NOTE — Patient Instructions (Signed)

## 2013-11-20 NOTE — Telephone Encounter (Signed)
Pt confirmed labs/ov per 10/14 POF, sent msg to add IVF next wk, gave pt AVS... KJ

## 2013-11-20 NOTE — Telephone Encounter (Signed)
Per staff message and POF I have scheduled appts. Advised scheduler of appts. JMW  

## 2013-11-20 NOTE — Assessment & Plan Note (Signed)
Recommend PICC line care with flushes.

## 2013-11-21 ENCOUNTER — Ambulatory Visit (HOSPITAL_BASED_OUTPATIENT_CLINIC_OR_DEPARTMENT_OTHER): Payer: BC Managed Care – PPO

## 2013-11-21 ENCOUNTER — Ambulatory Visit
Admission: RE | Admit: 2013-11-21 | Discharge: 2013-11-21 | Disposition: A | Discharge status: Home / Self Care | Payer: BC Managed Care – PPO | Source: Ambulatory Visit | Attending: Radiation Oncology | Admitting: Radiation Oncology

## 2013-11-21 VITALS — BP 115/75 | HR 70 | Temp 98.4°F | Resp 18

## 2013-11-21 DIAGNOSIS — R748 Abnormal levels of other serum enzymes: Secondary | ICD-10-CM

## 2013-11-21 DIAGNOSIS — R7989 Other specified abnormal findings of blood chemistry: Secondary | ICD-10-CM

## 2013-11-21 DIAGNOSIS — R1310 Dysphagia, unspecified: Secondary | ICD-10-CM

## 2013-11-21 DIAGNOSIS — Z510 Encounter for antineoplastic radiation therapy: Secondary | ICD-10-CM | POA: Diagnosis not present

## 2013-11-21 MED ORDER — HEPARIN SOD (PORK) LOCK FLUSH 100 UNIT/ML IV SOLN
250.0000 [IU] | Freq: Once | INTRAVENOUS | Status: AC | PRN
  Administered 2013-11-21: 250 [IU]
  Filled 2013-11-21: qty 5

## 2013-11-21 MED ORDER — SODIUM CHLORIDE 0.9 % IV SOLN
1000.0000 mL | Freq: Once | INTRAVENOUS | Status: AC
  Administered 2013-11-21: 08:00:00 via INTRAVENOUS

## 2013-11-21 MED ORDER — SODIUM CHLORIDE 0.9 % IJ SOLN
10.0000 mL | INTRAMUSCULAR | Status: DC | PRN
  Administered 2013-11-21: 10 mL
  Filled 2013-11-21: qty 10

## 2013-11-21 MED ORDER — ONDANSETRON 8 MG/50ML IVPB (CHCC): 8.0000 mg | Freq: Every day | INTRAVENOUS | Status: DC | PRN

## 2013-11-21 MED ORDER — PROMETHAZINE HCL 25 MG/ML IJ SOLN
25.0000 mg | Freq: Every day | INTRAMUSCULAR | Status: DC | PRN
  Filled 2013-11-21: qty 1

## 2013-11-21 NOTE — Patient Instructions (Signed)

## 2013-11-22 ENCOUNTER — Ambulatory Visit
Admission: RE | Admit: 2013-11-22 | Discharge: 2013-11-22 | Disposition: A | Discharge status: Home / Self Care | Payer: BC Managed Care – PPO | Source: Ambulatory Visit | Attending: Radiation Oncology | Admitting: Radiation Oncology

## 2013-11-22 ENCOUNTER — Ambulatory Visit (HOSPITAL_BASED_OUTPATIENT_CLINIC_OR_DEPARTMENT_OTHER): Payer: BC Managed Care – PPO

## 2013-11-22 VITALS — BP 117/76 | HR 76 | Temp 99.0°F | Resp 16

## 2013-11-22 DIAGNOSIS — Z510 Encounter for antineoplastic radiation therapy: Secondary | ICD-10-CM | POA: Diagnosis not present

## 2013-11-22 DIAGNOSIS — R748 Abnormal levels of other serum enzymes: Secondary | ICD-10-CM

## 2013-11-22 DIAGNOSIS — R7989 Other specified abnormal findings of blood chemistry: Secondary | ICD-10-CM

## 2013-11-22 DIAGNOSIS — R1310 Dysphagia, unspecified: Secondary | ICD-10-CM

## 2013-11-22 MED ORDER — SODIUM CHLORIDE 0.9 % IV SOLN
Freq: Once | INTRAVENOUS | Status: AC
  Administered 2013-11-22: 08:00:00 via INTRAVENOUS

## 2013-11-22 MED ORDER — HEPARIN SOD (PORK) LOCK FLUSH 100 UNIT/ML IV SOLN
500.0000 [IU] | Freq: Once | INTRAVENOUS | Status: DC | PRN
  Filled 2013-11-22: qty 5

## 2013-11-22 MED ORDER — ALTEPLASE 2 MG IJ SOLR
2.0000 mg | Freq: Once | INTRAMUSCULAR | Status: DC | PRN
  Filled 2013-11-22: qty 2

## 2013-11-22 MED ORDER — ONDANSETRON 8 MG/50ML IVPB (CHCC)
8.0000 mg | Freq: Every day | INTRAVENOUS | Status: DC | PRN
  Administered 2013-11-22: 8 mg via INTRAVENOUS

## 2013-11-22 MED ORDER — ONDANSETRON 8 MG/NS 50 ML IVPB
INTRAVENOUS | Status: AC
  Filled 2013-11-22: qty 8

## 2013-11-22 MED ORDER — PROMETHAZINE HCL 25 MG/ML IJ SOLN
25.0000 mg | Freq: Every day | INTRAMUSCULAR | Status: DC | PRN
  Filled 2013-11-22: qty 1

## 2013-11-22 MED ORDER — SODIUM CHLORIDE 0.9 % IJ SOLN
10.0000 mL | INTRAMUSCULAR | Status: DC | PRN
  Administered 2013-11-22: 10 mL
  Filled 2013-11-22: qty 10

## 2013-11-22 MED ORDER — HEPARIN SOD (PORK) LOCK FLUSH 100 UNIT/ML IV SOLN
250.0000 [IU] | Freq: Once | INTRAVENOUS | Status: AC | PRN
  Administered 2013-11-22: 11:00:00
  Filled 2013-11-22: qty 5

## 2013-11-22 NOTE — Patient Instructions (Signed)

## 2013-11-25 ENCOUNTER — Ambulatory Visit (HOSPITAL_BASED_OUTPATIENT_CLINIC_OR_DEPARTMENT_OTHER): Payer: BC Managed Care – PPO

## 2013-11-25 ENCOUNTER — Ambulatory Visit
Admission: RE | Admit: 2013-11-25 | Discharge: 2013-11-25 | Disposition: A | Discharge status: Home / Self Care | Payer: BC Managed Care – PPO | Source: Ambulatory Visit | Attending: Radiation Oncology | Admitting: Radiation Oncology

## 2013-11-25 ENCOUNTER — Encounter: Payer: Self-pay | Admitting: Radiation Oncology

## 2013-11-25 ENCOUNTER — Ambulatory Visit: Payer: BC Managed Care – PPO | Admitting: Nutrition

## 2013-11-25 VITALS — BP 109/66 | HR 74 | Temp 98.8°F | Resp 16

## 2013-11-25 VITALS — BP 107/64 | HR 79 | Temp 98.7°F | Resp 20 | Wt 206.1 lb

## 2013-11-25 DIAGNOSIS — Z510 Encounter for antineoplastic radiation therapy: Secondary | ICD-10-CM | POA: Diagnosis not present

## 2013-11-25 DIAGNOSIS — R7989 Other specified abnormal findings of blood chemistry: Secondary | ICD-10-CM

## 2013-11-25 DIAGNOSIS — R1310 Dysphagia, unspecified: Secondary | ICD-10-CM

## 2013-11-25 DIAGNOSIS — C01 Malignant neoplasm of base of tongue: Secondary | ICD-10-CM

## 2013-11-25 DIAGNOSIS — R748 Abnormal levels of other serum enzymes: Secondary | ICD-10-CM

## 2013-11-25 MED ORDER — HEPARIN SOD (PORK) LOCK FLUSH 100 UNIT/ML IV SOLN
250.0000 [IU] | Freq: Once | INTRAVENOUS | Status: AC | PRN
Start: 2013-11-25 — End: 2013-11-25
  Administered 2013-11-25: 250 [IU]
  Filled 2013-11-25: qty 5

## 2013-11-25 MED ORDER — SODIUM CHLORIDE 0.9 % IV SOLN
Freq: Once | INTRAVENOUS | Status: AC
  Administered 2013-11-25: 11:00:00 via INTRAVENOUS

## 2013-11-25 MED ORDER — ONDANSETRON 8 MG/50ML IVPB (CHCC): 8.0000 mg | Freq: Every day | INTRAVENOUS | Status: DC | PRN

## 2013-11-25 MED ORDER — HEPARIN SOD (PORK) LOCK FLUSH 100 UNIT/ML IV SOLN
500.0000 [IU] | Freq: Once | INTRAVENOUS | Status: AC | PRN
  Administered 2013-11-25: 250 [IU]
  Filled 2013-11-25: qty 5

## 2013-11-25 MED ORDER — SODIUM CHLORIDE 0.9 % IJ SOLN
10.0000 mL | INTRAMUSCULAR | Status: DC | PRN
  Administered 2013-11-25 (×2): 10 mL
  Filled 2013-11-25: qty 10

## 2013-11-25 NOTE — Progress Notes (Signed)
Nutrition followup completed with patient during IV fluids. Weight has increased and was documented as 206 pounds up from 201 pounds October 12. Patient is tolerating Osmolite 1.5 - 6 cans daily, via his feeding tube.  Patient denies nausea, vomiting, diarrhea, or constipation.  He feels well.  Labs were reviewed.  Estimated nutrition needs: 2300-2500 calories, 130-140 g protein, 2.6 L fluid.  Nutrition diagnosis: Inadequate oral intake continues.  Intervention: Continue Osmolite 1.5 -6 cans daily split up into 4 feedings.   Patient will work towards increasing to goal of 7 cans daily providing 2485 calories, 104 g protein.   Patient to continue free water flushes as directed. Will consider adding protein powder once patient meets goal rate of feedings.  Monitoring, evaluation, goals: Patient is tolerating tube feedings working towards goal rate of 7 cans daily.  Weight has increased on feedings and IV fluids.  Next visit: Monday, October 26, during chemotherapy.  **Disclaimer: This note was dictated with voice recognition software. Similar sounding words can inadvertently be transcribed and this note may contain transcription errors which may not have been corrected upon publication of note.**

## 2013-11-25 NOTE — Progress Notes (Signed)
Weekly Management Note:  Site: Right tonsil/base of tongue/neck Current Dose:  3200  cGy Projected Dose: 7000  cGy  Narrative: The patient is seen today for routine under treatment assessment. CBCT/MVCT images/port films were reviewed. The chart was reviewed.   He is doing reasonably well. His weight is up 5 pounds over the past week. He takes Zofran and also Reglan for his nausea/vomiting. He is also on a fentanyl patch. He takes oxycodone when necessary breakthrough pain. His third and final cycle of chemotherapy his next week.  Physical Examination:  Filed Vitals:   11/25/13 0953  BP: 107/64  Pulse: 79  Temp: 98.7 F (37.1 C)  Resp: 20  .  Weight: 206 lb 1.6 oz (93.486 kg). There has been further regression of his neck adenopathy. There is a confluent mucositis along his oropharynx, right greater than left. He has a brisk gag reflex.  Laboratory data: Lab Results  Component Value Date   WBC 4.8 11/19/2013   HGB 12.6* 11/19/2013   HCT 35.8* 11/19/2013   MCV 87.3 11/19/2013   PLT 103* 11/19/2013     Impression: Tolerating radiation therapy well.  Plan: Continue radiation therapy as planned.

## 2013-11-25 NOTE — Patient Instructions (Signed)

## 2013-11-25 NOTE — Progress Notes (Signed)
Patient c/o mild sore throat, nausea, mild fatigue. He is taking in all nutrition via peg tube, Osmolite and is NPO. He has gained 5 lbs in past week. He states he has vomited small amount x 1 in past 24 hours. He takes Zofran regularly every 8 hours, is taking Reglan prn. He is wearing Scopolamine patch, Fentanyl patch.  He is applying lotion to neck treatment area for some tanning of skin. He takes Oxycodone 10 mg prn for throat pain with fair to good relief. No signs of thrush noted on tongue today. He has chemo next Mon, states it is his last chemo treatment.

## 2013-11-26 ENCOUNTER — Ambulatory Visit: Payer: BC Managed Care – PPO

## 2013-11-26 ENCOUNTER — Encounter: Payer: Self-pay | Admitting: Nutrition

## 2013-11-26 ENCOUNTER — Ambulatory Visit (HOSPITAL_BASED_OUTPATIENT_CLINIC_OR_DEPARTMENT_OTHER): Payer: BC Managed Care – PPO

## 2013-11-26 VITALS — BP 109/64 | HR 77 | Temp 99.0°F | Resp 16

## 2013-11-26 DIAGNOSIS — R748 Abnormal levels of other serum enzymes: Secondary | ICD-10-CM

## 2013-11-26 DIAGNOSIS — R1310 Dysphagia, unspecified: Secondary | ICD-10-CM

## 2013-11-26 DIAGNOSIS — K121 Other forms of stomatitis: Secondary | ICD-10-CM

## 2013-11-26 DIAGNOSIS — R7989 Other specified abnormal findings of blood chemistry: Secondary | ICD-10-CM

## 2013-11-26 MED ORDER — SODIUM CHLORIDE 0.9 % IJ SOLN
10.0000 mL | INTRAMUSCULAR | Status: DC | PRN
  Administered 2013-11-26: 10 mL
  Filled 2013-11-26: qty 10

## 2013-11-26 MED ORDER — SODIUM CHLORIDE 0.9 % IV SOLN
Freq: Once | INTRAVENOUS | Status: AC
  Administered 2013-11-26: 10:00:00 via INTRAVENOUS

## 2013-11-26 MED ORDER — HEPARIN SOD (PORK) LOCK FLUSH 100 UNIT/ML IV SOLN
500.0000 [IU] | Freq: Once | INTRAVENOUS | Status: AC | PRN
  Administered 2013-11-26: 500 [IU]
  Filled 2013-11-26: qty 5

## 2013-11-26 NOTE — Patient Instructions (Signed)

## 2013-11-27 ENCOUNTER — Telehealth: Payer: Self-pay | Admitting: *Deleted

## 2013-11-27 ENCOUNTER — Ambulatory Visit: Payer: BC Managed Care – PPO

## 2013-11-27 ENCOUNTER — Other Ambulatory Visit (HOSPITAL_BASED_OUTPATIENT_CLINIC_OR_DEPARTMENT_OTHER): Payer: BC Managed Care – PPO

## 2013-11-27 ENCOUNTER — Ambulatory Visit
Admission: RE | Admit: 2013-11-27 | Discharge: 2013-11-27 | Disposition: A | Discharge status: Home / Self Care | Payer: BC Managed Care – PPO | Source: Ambulatory Visit | Attending: Radiation Oncology | Admitting: Radiation Oncology

## 2013-11-27 ENCOUNTER — Ambulatory Visit (HOSPITAL_BASED_OUTPATIENT_CLINIC_OR_DEPARTMENT_OTHER): Payer: BC Managed Care – PPO

## 2013-11-27 VITALS — BP 142/85 | HR 85 | Temp 99.1°F | Resp 20

## 2013-11-27 DIAGNOSIS — R7989 Other specified abnormal findings of blood chemistry: Secondary | ICD-10-CM

## 2013-11-27 DIAGNOSIS — Z510 Encounter for antineoplastic radiation therapy: Secondary | ICD-10-CM | POA: Diagnosis not present

## 2013-11-27 DIAGNOSIS — R1310 Dysphagia, unspecified: Secondary | ICD-10-CM

## 2013-11-27 DIAGNOSIS — R748 Abnormal levels of other serum enzymes: Secondary | ICD-10-CM

## 2013-11-27 DIAGNOSIS — Z452 Encounter for adjustment and management of vascular access device: Secondary | ICD-10-CM

## 2013-11-27 DIAGNOSIS — C01 Malignant neoplasm of base of tongue: Secondary | ICD-10-CM

## 2013-11-27 LAB — CBC WITH DIFFERENTIAL/PLATELET
BASO%: 0.6 % (ref 0.0–2.0)
Basophils Absolute: 0 10*3/uL (ref 0.0–0.1)
EOS%: 2.2 % (ref 0.0–7.0)
Eosinophils Absolute: 0 10*3/uL (ref 0.0–0.5)
HCT: 29.9 % — ABNORMAL LOW (ref 38.4–49.9)
HGB: 10.2 g/dL — ABNORMAL LOW (ref 13.0–17.1)
LYMPH%: 33.7 % (ref 14.0–49.0)
MCH: 30.5 pg (ref 27.2–33.4)
MCHC: 34.1 g/dL (ref 32.0–36.0)
MCV: 89.5 fL (ref 79.3–98.0)
MONO#: 0.5 10*3/uL (ref 0.1–0.9)
MONO%: 29.8 % — ABNORMAL HIGH (ref 0.0–14.0)
NEUT#: 0.6 10*3/uL — ABNORMAL LOW (ref 1.5–6.5)
NEUT%: 33.7 % — ABNORMAL LOW (ref 39.0–75.0)
Platelets: 234 10*3/uL (ref 140–400)
RBC: 3.34 10*6/uL — ABNORMAL LOW (ref 4.20–5.82)
RDW: 12.8 % (ref 11.0–14.6)
WBC: 1.8 10*3/uL — ABNORMAL LOW (ref 4.0–10.3)
lymph#: 0.6 10*3/uL — ABNORMAL LOW (ref 0.9–3.3)

## 2013-11-27 LAB — COMPREHENSIVE METABOLIC PANEL
ALT: 10 U/L (ref 0–53)
AST: 13 U/L (ref 0–37)
Albumin: 3.2 g/dL — ABNORMAL LOW (ref 3.5–5.2)
Alkaline Phosphatase: 61 U/L (ref 39–117)
BUN: 19 mg/dL (ref 6–23)
CO2: 28 mEq/L (ref 19–32)
Calcium: 8.9 mg/dL (ref 8.4–10.5)
Chloride: 95 mEq/L — ABNORMAL LOW (ref 96–112)
Creatinine, Ser: 1.16 mg/dL (ref 0.50–1.35)
Glucose, Bld: 105 mg/dL — ABNORMAL HIGH (ref 70–99)
Potassium: 4.1 mEq/L (ref 3.5–5.3)
Sodium: 134 mEq/L — ABNORMAL LOW (ref 135–145)
Total Bilirubin: 0.6 mg/dL (ref 0.3–1.2)
Total Protein: 6.9 g/dL (ref 6.0–8.3)

## 2013-11-27 MED ORDER — ONDANSETRON 8 MG/50ML IVPB (CHCC)
8.0000 mg | Freq: Every day | INTRAVENOUS | Status: DC | PRN
  Administered 2013-11-27: 8 mg via INTRAVENOUS

## 2013-11-27 MED ORDER — HEPARIN SOD (PORK) LOCK FLUSH 100 UNIT/ML IV SOLN
250.0000 [IU] | Freq: Once | INTRAVENOUS | Status: AC | PRN
  Administered 2013-11-27: 250 [IU]
  Filled 2013-11-27: qty 5

## 2013-11-27 MED ORDER — ONDANSETRON 8 MG/NS 50 ML IVPB
INTRAVENOUS | Status: AC
  Filled 2013-11-27: qty 8

## 2013-11-27 MED ORDER — SODIUM CHLORIDE 0.9 % IV SOLN
Freq: Once | INTRAVENOUS | Status: AC
  Administered 2013-11-27: 09:00:00 via INTRAVENOUS

## 2013-11-27 MED ORDER — SODIUM CHLORIDE 0.9 % IJ SOLN
10.0000 mL | INTRAMUSCULAR | Status: DC | PRN
  Administered 2013-11-27: 10 mL via INTRAVENOUS
  Filled 2013-11-27: qty 10

## 2013-11-27 MED ORDER — SODIUM CHLORIDE 0.9 % IJ SOLN
10.0000 mL | INTRAMUSCULAR | Status: DC | PRN
  Administered 2013-11-27: 10 mL
  Filled 2013-11-27: qty 10

## 2013-11-27 MED ORDER — HEPARIN SOD (PORK) LOCK FLUSH 100 UNIT/ML IV SOLN
500.0000 [IU] | Freq: Once | INTRAVENOUS | Status: AC
  Administered 2013-11-27: 250 [IU] via INTRAVENOUS
  Filled 2013-11-27: qty 5

## 2013-11-27 NOTE — Patient Instructions (Signed)
PICC Home Guide A peripherally inserted central catheter (PICC) is a long, thin, flexible tube that is inserted into a vein in the upper arm. It is a form of intravenous (IV) access. It is considered to be a "central" line because the tip of the PICC ends in a large vein in your chest. This large vein is called the superior vena cava (SVC). The PICC tip ends in the SVC because there is a lot of blood flow in the SVC. This allows medicines and IV fluids to be quickly distributed throughout the body. The PICC is inserted using a sterile technique by a specially trained nurse or physician. After the PICC is inserted, a chest X-ray exam is done to be sure it is in the correct place.  A PICC may be placed for different reasons, such as:  To give medicines and liquid nutrition that can only be given through a central line. Examples are:  Certain antibiotic treatments.  Chemotherapy.  Total parenteral nutrition (TPN).  To take frequent blood samples.  To give IV fluids and blood products.  If there is difficulty placing a peripheral intravenous (PIV) catheter. If taken care of properly, a PICC can remain in place for several months. A PICC can also allow a person to go home from the hospital early. Medicine and PICC care can be managed at home by a family member or home health care team. WHAT PROBLEMS CAN HAPPEN WHEN I HAVE A PICC? Problems with a PICC can occasionally occur. These may include the following:  A blood clot (thrombus) forming in or at the tip of the PICC. This can cause the PICC to become clogged. A clot-dissolving medicine called tissue plasminogen activator (tPA) can be given through the PICC to help break up the clot.  Inflammation of the vein (phlebitis) in which the PICC is placed. Signs of inflammation may include redness, pain at the insertion site, red streaks, or being able to feel a "cord" in the vein where the PICC is located.  Infection in the PICC or at the insertion  site. Signs of infection may include fever, chills, redness, swelling, or pus drainage from the PICC insertion site.  PICC movement (malposition). The PICC tip may move from its original position due to excessive physical activity, forceful coughing, sneezing, or vomiting.  A break or cut in the PICC. It is important to not use scissors near the PICC.  Nerve or tendon irritation or injury during PICC insertion. WHAT SHOULD I KEEP IN MIND ABOUT ACTIVITIES WHEN I HAVE A PICC?  You may bend your arm and move it freely. If your PICC is near or at the bend of your elbow, avoid activity with repeated motion at the elbow.  Rest at home for the remainder of the day following PICC line insertion.  Avoid lifting heavy objects as instructed by your health care provider.  Avoid using a crutch with the arm on the same side as your PICC. You may need to use a walker. WHAT SHOULD I KNOW ABOUT MY PICC DRESSING?  Keep your PICC bandage (dressing) clean and dry to prevent infection.  Ask your health care provider when you may shower. Ask your health care provider to teach you how to wrap the PICC when you do take a shower.  Change the PICC dressing as instructed by your health care provider.  Change your PICC dressing if it becomes loose or wet. WHAT SHOULD I KNOW ABOUT PICC CARE?  Check the PICC insertion site   daily for leakage, redness, swelling, or pain.  Do not take a bath, swim, or use hot tubs when you have a PICC. Cover PICC line with clear plastic wrap and tape to keep it dry while showering.  Flush the PICC as directed by your health care provider. Let your health care provider know right away if the PICC is difficult to flush or does not flush. Do not use force to flush the PICC.  Do not use a syringe that is less than 10 mL to flush the PICC.  Never pull or tug on the PICC.  Avoid blood pressure checks on the arm with the PICC.  Keep your PICC identification card with you at all  times.  Do not take the PICC out yourself. Only a trained clinical professional should remove the PICC. SEEK IMMEDIATE MEDICAL CARE IF:  Your PICC is accidentally pulled all the way out. If this happens, cover the insertion site with a bandage or gauze dressing. Do not throw the PICC away. Your health care provider will need to inspect it.  Your PICC was tugged or pulled and has partially come out. Do not  push the PICC back in.  There is any type of drainage, redness, or swelling where the PICC enters the skin.  You cannot flush the PICC, it is difficult to flush, or the PICC leaks around the insertion site when it is flushed.  You hear a "flushing" sound when the PICC is flushed.  You have pain, discomfort, or numbness in your arm, shoulder, or jaw on the same side as the PICC.  You feel your heart "racing" or skipping beats.  You notice a hole or tear in the PICC.  You develop chills or a fever. MAKE SURE YOU:   Understand these instructions.  Will watch your condition.  Will get help right away if you are not doing well or get worse. Document Released: 07/31/2002 Document Revised: 06/10/2013 Document Reviewed: 10/01/2012 [employer] Patient Information 2015 [employer]. This information is not intended to replace advice given to you by your health care provider. Make sure you discuss any questions you have with your health care provider.  

## 2013-11-27 NOTE — Telephone Encounter (Signed)
Wife called to get results of CMET.  Informed her of Creatinine improved.  Pt still vomiting in spite of taking Reglan, Zofran and Scopolamine patch.  They will discuss w/ Dr. Alvy Bimler on visit tomorrow.

## 2013-11-27 NOTE — Patient Instructions (Signed)

## 2013-11-28 ENCOUNTER — Encounter: Payer: Self-pay | Admitting: Hematology and Oncology

## 2013-11-28 ENCOUNTER — Ambulatory Visit
Admission: RE | Admit: 2013-11-28 | Discharge: 2013-11-28 | Disposition: A | Discharge status: Home / Self Care | Payer: BC Managed Care – PPO | Source: Ambulatory Visit | Attending: Radiation Oncology | Admitting: Radiation Oncology

## 2013-11-28 ENCOUNTER — Ambulatory Visit (HOSPITAL_BASED_OUTPATIENT_CLINIC_OR_DEPARTMENT_OTHER): Payer: BC Managed Care – PPO | Admitting: Hematology and Oncology

## 2013-11-28 ENCOUNTER — Telehealth: Payer: Self-pay | Admitting: *Deleted

## 2013-11-28 ENCOUNTER — Telehealth: Payer: Self-pay | Admitting: Hematology and Oncology

## 2013-11-28 ENCOUNTER — Encounter: Payer: Self-pay | Admitting: *Deleted

## 2013-11-28 ENCOUNTER — Ambulatory Visit (HOSPITAL_BASED_OUTPATIENT_CLINIC_OR_DEPARTMENT_OTHER): Payer: BC Managed Care – PPO

## 2013-11-28 VITALS — BP 125/103 | HR 95 | Temp 99.0°F | Resp 17

## 2013-11-28 DIAGNOSIS — R5081 Fever presenting with conditions classified elsewhere: Secondary | ICD-10-CM

## 2013-11-28 DIAGNOSIS — R070 Pain in throat: Secondary | ICD-10-CM

## 2013-11-28 DIAGNOSIS — Z931 Gastrostomy status: Secondary | ICD-10-CM

## 2013-11-28 DIAGNOSIS — E46 Unspecified protein-calorie malnutrition: Secondary | ICD-10-CM

## 2013-11-28 DIAGNOSIS — Z510 Encounter for antineoplastic radiation therapy: Secondary | ICD-10-CM | POA: Diagnosis not present

## 2013-11-28 DIAGNOSIS — R748 Abnormal levels of other serum enzymes: Secondary | ICD-10-CM

## 2013-11-28 DIAGNOSIS — R1310 Dysphagia, unspecified: Secondary | ICD-10-CM

## 2013-11-28 DIAGNOSIS — D709 Neutropenia, unspecified: Secondary | ICD-10-CM

## 2013-11-28 DIAGNOSIS — C01 Malignant neoplasm of base of tongue: Secondary | ICD-10-CM

## 2013-11-28 DIAGNOSIS — R110 Nausea: Secondary | ICD-10-CM

## 2013-11-28 DIAGNOSIS — R7989 Other specified abnormal findings of blood chemistry: Secondary | ICD-10-CM

## 2013-11-28 MED ORDER — SODIUM CHLORIDE 0.9 % IJ SOLN
10.0000 mL | INTRAMUSCULAR | Status: DC | PRN
  Administered 2013-11-28 (×2): 10 mL
  Filled 2013-11-28: qty 10

## 2013-11-28 MED ORDER — SODIUM CHLORIDE 0.9 % IV SOLN
Freq: Once | INTRAVENOUS | Status: AC
  Administered 2013-11-28: 10:00:00 via INTRAVENOUS

## 2013-11-28 MED ORDER — ONDANSETRON 8 MG/NS 50 ML IVPB
INTRAVENOUS | Status: AC
Start: 2013-11-28 — End: 2013-11-28
  Filled 2013-11-28: qty 8

## 2013-11-28 MED ORDER — HEPARIN SOD (PORK) LOCK FLUSH 100 UNIT/ML IV SOLN
250.0000 [IU] | Freq: Once | INTRAVENOUS | Status: AC | PRN
  Administered 2013-11-28 (×2): 250 [IU]
  Filled 2013-11-28: qty 5

## 2013-11-28 MED ORDER — ONDANSETRON 8 MG/50ML IVPB (CHCC)
8.0000 mg | Freq: Every day | INTRAVENOUS | Status: DC | PRN
  Administered 2013-11-28: 8 mg via INTRAVENOUS

## 2013-11-28 NOTE — Patient Instructions (Signed)
Dehydration, Adult Dehydration is when you lose more fluids from the body than you take in. Vital organs like the kidneys, brain, and heart cannot function without a proper amount of fluids and salt. Any loss of fluids from the body can cause dehydration.  CAUSES   Vomiting.  Diarrhea.  Excessive sweating.  Excessive urine output.  Fever. SYMPTOMS  Mild dehydration  Thirst.  Dry lips.  Slightly dry mouth. Moderate dehydration  Very dry mouth.  Sunken eyes.  Skin does not bounce back quickly when lightly pinched and released.  Dark urine and decreased urine production.  Decreased tear production.  Headache. Severe dehydration  Very dry mouth.  Extreme thirst.  Rapid, weak pulse (more than 100 beats per minute at rest).  Cold hands and feet.  Not able to sweat in spite of heat and temperature.  Rapid breathing.  Blue lips.  Confusion and lethargy.  Difficulty being awakened.  Minimal urine production.  No tears. DIAGNOSIS  Your caregiver will diagnose dehydration based on your symptoms and your exam. Blood and urine tests will help confirm the diagnosis. The diagnostic evaluation should also identify the cause of dehydration. TREATMENT  Treatment of mild or moderate dehydration can often be done at home by increasing the amount of fluids that you drink. It is best to drink small amounts of fluid more often. Drinking too much at one time can make vomiting worse. Refer to the home care instructions below. Severe dehydration needs to be treated at the hospital where you will probably be given intravenous (IV) fluids that contain water and electrolytes. HOME CARE INSTRUCTIONS   Ask your caregiver about specific rehydration instructions.  Drink enough fluids to keep your urine clear or pale yellow.  Drink small amounts frequently if you have nausea and vomiting.  Eat as you normally do.  Avoid:  Foods or drinks high in sugar.  Carbonated  drinks.  Juice.  Extremely hot or cold fluids.  Drinks with caffeine.  Fatty, greasy foods.  Alcohol.  Tobacco.  Overeating.  Gelatin desserts.  Wash your hands well to avoid spreading bacteria and viruses.  Only take over-the-counter or prescription medicines for pain, discomfort, or fever as directed by your caregiver.  Ask your caregiver if you should continue all prescribed and over-the-counter medicines.  Keep all follow-up appointments with your caregiver. SEEK MEDICAL CARE IF:  You have abdominal pain and it increases or stays in one area (localizes).  You have a rash, stiff neck, or severe headache.  You are irritable, sleepy, or difficult to awaken.  You are weak, dizzy, or extremely thirsty. SEEK IMMEDIATE MEDICAL CARE IF:   You are unable to keep fluids down or you get worse despite treatment.  You have frequent episodes of vomiting or diarrhea.  You have blood or green matter (bile) in your vomit.  You have blood in your stool or your stool looks black and tarry.  You have not urinated in 6 to 8 hours, or you have only urinated a small amount of very dark urine.  You have a fever.  You faint. MAKE SURE YOU:   Understand these instructions.  Will watch your condition.  Will get help right away if you are not doing well or get worse. Document Released: 01/24/2005 Document Revised: 04/18/2011 Document Reviewed: 09/13/2010 ExitCare Patient Information 2015 [employer]. This information is not intended to replace advice given to you by your health care provider. Make sure you discuss any questions you have with your health care   provider.   Ondansetron injection What is this medicine? ONDANSETRON (on DAN se tron) is used to treat nausea and vomiting caused by chemotherapy. It is also used to prevent or treat nausea and vomiting after surgery. This medicine may be used for other purposes; ask your health care provider or pharmacist if you have  questions. COMMON BRAND NAME(S): Zofran What should I tell my health care provider before I take this medicine? They need to know if you have any of these conditions: -heart disease -history of irregular heartbeat -liver disease -low levels of magnesium or potassium in the blood -an unusual or allergic reaction to ondansetron, granisetron, other medicines, foods, dyes, or preservatives -pregnant or trying to get pregnant -breast-feeding How should I use this medicine? This medicine is for infusion into a vein. It is given by a health care professional in a hospital or clinic setting. Talk to your pediatrician regarding the use of this medicine in children. Special care may be needed. Overdosage: If you think you have taken too much of this medicine contact a poison control center or emergency room at once. NOTE: This medicine is only for you. Do not share this medicine with others. What if I miss a dose? This does not apply. What may interact with this medicine? Do not take this medicine with any of the following medications: -apomorphine -certain medicines for fungal infections like fluconazole, itraconazole, ketoconazole, posaconazole, voriconazole -cisapride -dofetilide -dronedarone -pimozide -thioridazine -ziprasidone This medicine may also interact with the following medications: -carbamazepine -certain medicines for depression, anxiety, or psychotic disturbances -fentanyl -linezolid -MAOIs like Carbex, Eldepryl, Marplan, Nardil, and Parnate -methylene blue (injected into a vein) -other medicines that prolong the QT interval (cause an abnormal heart rhythm) -phenytoin -rifampicin -tramadol This list may not describe all possible interactions. Give your health care provider a list of all the medicines, herbs, non-prescription drugs, or dietary supplements you use. Also tell them if you smoke, drink alcohol, or use illegal drugs. Some items may interact with your  medicine. What should I watch for while using this medicine? Your condition will be monitored carefully while you are receiving this medicine. What side effects may I notice from receiving this medicine? Side effects that you should report to your doctor or health care professional as soon as possible: -allergic reactions like skin rash, itching or hives, swelling of the face, lips, or tongue -breathing problems -confusion -dizziness -fast or irregular heartbeat -feeling faint or lightheaded, falls -fever and chills -loss of balance or coordination -seizures -sweating -swelling of the hands and feet -tightness in the chest -tremors -unusually weak or tired Side effects that usually do not require medical attention (report to your doctor or health care professional if they continue or are bothersome): -constipation or diarrhea -headache This list may not describe all possible side effects. Call your doctor for medical advice about side effects. You may report side effects to FDA at 1-800-FDA-1088. Where should I keep my medicine? This drug is given in a hospital or clinic and will not be stored at home. NOTE: This sheet is a summary. It may not cover all possible information. If you have questions about this medicine, talk to your doctor, pharmacist, or health care provider.  2015, Elsevier/Gold Standard. (2012-10-31 16:18:28)  

## 2013-11-28 NOTE — Assessment & Plan Note (Signed)
He will continue on Reglan before meals and scopolamine patch. I felt that that is a component of mucus gagging. He has benefited from IV fluids and I recommend we continue the same.

## 2013-11-28 NOTE — Assessment & Plan Note (Signed)
This is getting more severe. Recommend him to continue taking fentanyl patch.

## 2013-11-28 NOTE — Telephone Encounter (Signed)
lvm for pt regarding to added appt pt will get new sched tomorrow

## 2013-11-28 NOTE — Progress Notes (Signed)
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 There are right level 2 and level 3 adenopathy as described and right peritracheal lymph node within the superior mediastinum   09/12/2013 Surgery Laryngoscopy showed the oropharynx revealed a firm friable mass involving the lateral base of tongue and inferior tonsillar fossa.    09/13/2013 Pathology Results Accession: [id number] biopsy confirmed squamous cell carcinoma, HPV positive   09/25/2013 Imaging PET scan showed right base of tongue mass exhibits intense FDG uptake compatible with primary head neck carcinoma with associated hypermetabolic right level to adenopathy   10/03/2013 Procedure He had dental extraction   10/21/2013 -  Chemotherapy He start on high dose cisplatin   11/04/2013 -  Radiation Therapy He received radiation therapy   11/19/2013 Procedure He had placement of right PICC line and removal of port    INTERVAL HISTORY: Please see below for problem oriented charting. He is seen today as part of his weekly supportive care visit. He complained of worsening sore throat. Yesterday, he had some low-grade fever. He denies any cough. He has a lot of mucus at the back of his throat and that has caused some  nausea and kinking. No recent vomiting. His appetite is very poor.  REVIEW OF SYSTEMS:   Eyes: Denies blurriness of vision Respiratory: Denies cough, dyspnea or wheezes Cardiovascular: Denies palpitation, chest discomfort or lower extremity swelling Skin: Denies abnormal skin rashes Lymphatics: Denies new lymphadenopathy or easy bruising Neurological:Denies numbness, tingling or new weaknesses Behavioral/Psych: Mood is stable, no new changes  All other systems were reviewed with the patient and are negative.  I have reviewed the past medical history, past surgical history, social history and family history with the patient and they are unchanged from previous note.  ALLERGIES:  is allergic to codeine and velosef.  MEDICATIONS:  Current Outpatient Prescriptions  Medication Sig Dispense Refill  . amoxicillin-clavulanate (AUGMENTIN) 400-57 MG/5ML suspension Take 400 mg by mouth 2 (two) times daily.      . fentaNYL (DURAGESIC - DOSED MCG/HR) 25 MCG/HR patch Place 1 patch (25 mcg total) onto the skin every 3 (three) days.  5 patch  0  . hyaluronate sodium (RADIAPLEXRX) GEL Apply 1 application topically 2 (two) times daily.      Marland Kitchen HYDROcodone-acetaminophen (NORCO) 7.5-325 MG per tablet Take 1-2 tablets by mouth every 4 (four) hours as needed for moderate pain.  50 tablet  0  . lidocaine-prilocaine (EMLA) cream Apply 1 application topically as needed (per port-a-cath).      . metoCLOPramide (REGLAN) 5 MG/5ML solution Take 10 mLs (10 mg total) by mouth 4 (four) times daily -  before meals and at bedtime.  480 mL  3  . Nutritional Supplements (  FEEDING SUPPLEMENT, OSMOLITE 1.5 CAL,) LIQD Begin 1 can Osmolite 1.5 QID with 60 ml free water before and after bolus feeding.  Increase to 1.5 cans QID on day 2.  Increase to goal of 1.5 cans BID and 2 cans BID day 3.  Flush with additional 240 cc water TID between feedings. Send formula please.  1659 mL  0  . ondansetron (ZOFRAN) 8 MG tablet Take 1 tablet (8  mg total) by mouth every 8 (eight) hours as needed for nausea or vomiting.  60 tablet  6  . oxyCODONE 10 MG TABS Take 1 tablet (10 mg total) by mouth every 4 (four) hours as needed for severe pain.  60 tablet  0  . prochlorperazine (COMPAZINE) 10 MG tablet Take 1 tablet (10 mg total) by mouth every 6 (six) hours as needed for nausea or vomiting.  60 tablet  3  . scopolamine (TRANSDERM-SCOP) 1 MG/3DAYS Place 1 patch (1.5 mg total) onto the skin every 3 (three) days.  10 patch  12  . Sodium Chloride Flush 0.9 % SOLN injection Inject 10 mLs into the vein daily.  100 Syringe  3  . sodium fluoride (FLUORISHIELD) 1.1 % GEL dental gel Place 1 application onto teeth at bedtime.       No current facility-administered medications for this visit.    PHYSICAL EXAMINATION: ECOG PERFORMANCE STATUS: 2 - Symptomatic, <50% confined to bed GENERAL:alert, no distress and comfortable SKIN: skin color, texture, turgor are normal, no rashes or significant lesions EYES: normal, Conjunctiva are pink and non-injected, sclera clear OROPHARYNX: There is evidence of mucositis. No thrush. NECK: supple, thyroid normal size, non-tender, without nodularity LYMPH:  no palpable lymphadenopathy in the cervical, axillary or inguinal LUNGS: clear to auscultation and percussion with normal breathing effort HEART: regular rate & rhythm and no murmurs and no lower extremity edema.  ABDOMEN:abdomen soft, non-tender and normal bowel sounds. Feeding tube site looks okay Musculoskeletal:no cyanosis of digits and no clubbing  NEURO: alert & oriented x 3 with fluent speech, no focal motor/sensory deficits  LABORATORY DATA:  I have reviewed the data as listed    Component Value Date/Time   NA 134* 11/27/2013 0813   NA 135* 11/15/2013 0810   K 4.1 11/27/2013 0813   K 4.6 11/15/2013 0810   CL 95* 11/27/2013 0813   CO2 28 11/27/2013 0813   CO2 32* 11/15/2013 0810   GLUCOSE 105* 11/27/2013 0813   GLUCOSE 106 11/15/2013 0810   BUN 19  11/27/2013 0813   BUN 29.6* 11/15/2013 0810   CREATININE 1.16 11/27/2013 0813   CREATININE 1.9* 11/15/2013 0810   CALCIUM 8.9 11/27/2013 0813   CALCIUM 8.7 11/15/2013 0810   PROT 6.9 11/27/2013 0813   PROT 7.1 11/15/2013 0810   ALBUMIN 3.2* 11/27/2013 0813   ALBUMIN 3.4* 11/15/2013 0810   AST 13 11/27/2013 0813   AST 18 11/15/2013 0810   ALT 10 11/27/2013 0813   ALT 20 11/15/2013 0810   ALKPHOS 61 11/27/2013 0813   ALKPHOS 69 11/15/2013 0810   BILITOT 0.6 11/27/2013 0813   BILITOT 1.06 11/15/2013 0810   GFRNONAA 40* 11/19/2013 1105   GFRAA 47* 11/19/2013 1105    No results found for this basename: SPEP, UPEP,  kappa and lambda light chains    Lab Results  Component Value Date   WBC 1.8* 11/27/2013   NEUTROABS 0.6* 11/27/2013   HGB 10.2* 11/27/2013   HCT 29.9* 11/27/2013   MCV 89.5 11/27/2013   PLT 234 11/27/2013  Chemistry      Component Value Date/Time   NA 134* 11/27/2013 0813   NA 135* 11/15/2013 0810   K 4.1 11/27/2013 0813   K 4.6 11/15/2013 0810   CL 95* 11/27/2013 0813   CO2 28 11/27/2013 0813   CO2 32* 11/15/2013 0810   BUN 19 11/27/2013 0813   BUN 29.6* 11/15/2013 0810   CREATININE 1.16 11/27/2013 0813   CREATININE 1.9* 11/15/2013 0810      Component Value Date/Time   CALCIUM 8.9 11/27/2013 0813   CALCIUM 8.7 11/15/2013 0810   ALKPHOS 61 11/27/2013 0813   ALKPHOS 69 11/15/2013 0810   AST 13 11/27/2013 0813   AST 18 11/15/2013 0810   ALT 10 11/27/2013 0813   ALT 20 11/15/2013 0810   BILITOT 0.6 11/27/2013 0813   BILITOT 1.06 11/15/2013 0810     ASSESSMENT & PLAN:  Squamous cell carcinoma of the right base of tongue He has mild neutropenic fever. I discussed with the patient management of neutropenic fever. Clinically, he is stable. The most likely source of infection likely originate from the oropharynx. I recommend him to continue antibiotic therapy. Since last night, he has no further fever. I would recommend we continue on aggressive supportive care. He  is comfortable to be managed in the outpatient setting.  Nausea without vomiting He will continue on Reglan before meals and scopolamine patch. I felt that that is a component of mucus gagging. He has benefited from IV fluids and I recommend we continue the same.    Protein calorie malnutrition This is getting worse. He has been seen by a dietitian who recommended increase tube feeds.    S/P gastrostomy The feeding tube site looks okay with no signs of infection.   Throat pain in adult This is getting more severe. Recommend him to continue taking fentanyl patch.    Neutropenic fever Most likely source of infection is in the mouth. He would continue antibiotic therapy. Recheck blood count next week   No orders of the defined types were placed in this encounter.   All questions were answered. The patient knows to call the clinic with any problems, questions or concerns. No barriers to learning was detected. I spent 30 minutes counseling the patient face to face. The total time spent in the appointment was 40 minutes and more than 50% was on counseling and review of test results     [facility], [place], MD 11/28/2013 9:01 PM

## 2013-11-28 NOTE — Assessment & Plan Note (Signed)
This is getting worse. He has been seen by a dietitian who recommended increase tube feeds.

## 2013-11-28 NOTE — Telephone Encounter (Signed)
Per MD I have scheduled appts, Patietn to receive appts wioth AVS

## 2013-11-28 NOTE — Progress Notes (Signed)
1020 Patient with fever of 101.0. Per wife patient has been running temperature and Dr. Alvy Bimler has patient taking antibiotics and Tylenol and patient is due his tylenol. Wife gave tylenol through feeding tube.   1115. Dr Alvy Bimler at chairside, assessing patient.

## 2013-11-28 NOTE — Assessment & Plan Note (Signed)
Most likely source of infection is in the mouth. He would continue antibiotic therapy. Recheck blood count next week

## 2013-11-28 NOTE — Assessment & Plan Note (Signed)
The feeding tube site looks okay with no signs of infection.

## 2013-11-28 NOTE — Assessment & Plan Note (Signed)
He has mild neutropenic fever. I discussed with the patient management of neutropenic fever. Clinically, he is stable. The most likely source of infection likely originate from the oropharynx. I recommend him to continue antibiotic therapy. Since last night, he has no further fever. I would recommend we continue on aggressive supportive care. He is comfortable to be managed in the outpatient setting.

## 2013-11-28 NOTE — Progress Notes (Addendum)
To provide support and encouragement, care continuity and to assess for needs, met with patient and his wife prior to [facility] tmt.  He reported: 1. Right neck pain baseline of 3/10, resolved with Oxycodone.  I encouraged him to ask Dr. Alvy Bimler when he sees her this morning about increasing Fentanyl dosage if he desires. 2. Gained 4 lbs. 3. Using feeding tube for nutritional supplementation, ca. 4 cans/day. 4.   Sense of taste diminishing. He denied any needs/concerns at this time, understands he can contact me if that changes.  Gayleen Orem, RN, BSN, [facility] [phone]

## 2013-11-29 ENCOUNTER — Ambulatory Visit
Admission: RE | Admit: 2013-11-29 | Discharge: 2013-11-29 | Disposition: A | Discharge status: Home / Self Care | Payer: BC Managed Care – PPO | Source: Ambulatory Visit | Attending: Radiation Oncology | Admitting: Radiation Oncology

## 2013-11-29 ENCOUNTER — Ambulatory Visit (HOSPITAL_BASED_OUTPATIENT_CLINIC_OR_DEPARTMENT_OTHER): Payer: BC Managed Care – PPO

## 2013-11-29 VITALS — BP 126/78 | HR 78 | Temp 99.4°F | Resp 20

## 2013-11-29 DIAGNOSIS — R748 Abnormal levels of other serum enzymes: Secondary | ICD-10-CM

## 2013-11-29 DIAGNOSIS — Z510 Encounter for antineoplastic radiation therapy: Secondary | ICD-10-CM | POA: Diagnosis not present

## 2013-11-29 DIAGNOSIS — R112 Nausea with vomiting, unspecified: Secondary | ICD-10-CM

## 2013-11-29 DIAGNOSIS — R1310 Dysphagia, unspecified: Secondary | ICD-10-CM

## 2013-11-29 DIAGNOSIS — R7989 Other specified abnormal findings of blood chemistry: Secondary | ICD-10-CM

## 2013-11-29 MED ORDER — ONDANSETRON 8 MG/NS 50 ML IVPB
INTRAVENOUS | Status: AC
  Filled 2013-11-29: qty 8

## 2013-11-29 MED ORDER — ONDANSETRON 8 MG/50ML IVPB (CHCC)
8.0000 mg | Freq: Every day | INTRAVENOUS | Status: DC | PRN
  Administered 2013-11-29: 8 mg via INTRAVENOUS

## 2013-11-29 MED ORDER — SODIUM CHLORIDE 0.9 % IV SOLN
Freq: Once | INTRAVENOUS | Status: AC
  Administered 2013-11-29: 10:00:00 via INTRAVENOUS

## 2013-11-29 MED ORDER — HEPARIN SOD (PORK) LOCK FLUSH 100 UNIT/ML IV SOLN
250.0000 [IU] | Freq: Once | INTRAVENOUS | Status: AC | PRN
  Administered 2013-11-29: 250 [IU]
  Filled 2013-11-29: qty 5

## 2013-11-29 MED ORDER — SODIUM CHLORIDE 0.9 % IJ SOLN
10.0000 mL | INTRAMUSCULAR | Status: DC | PRN
  Administered 2013-11-29: 10 mL
  Filled 2013-11-29: qty 10

## 2013-11-29 NOTE — Patient Instructions (Signed)
Dehydration, Adult Dehydration is when you lose more fluids from the body than you take in. Vital organs like the kidneys, brain, and heart cannot function without a proper amount of fluids and salt. Any loss of fluids from the body can cause dehydration.  CAUSES   Vomiting.  Diarrhea.  Excessive sweating.  Excessive urine output.  Fever. SYMPTOMS  Mild dehydration  Thirst.  Dry lips.  Slightly dry mouth. Moderate dehydration  Very dry mouth.  Sunken eyes.  Skin does not bounce back quickly when lightly pinched and released.  Dark urine and decreased urine production.  Decreased tear production.  Headache. Severe dehydration  Very dry mouth.  Extreme thirst.  Rapid, weak pulse (more than 100 beats per minute at rest).  Cold hands and feet.  Not able to sweat in spite of heat and temperature.  Rapid breathing.  Blue lips.  Confusion and lethargy.  Difficulty being awakened.  Minimal urine production.  No tears. DIAGNOSIS  Your caregiver will diagnose dehydration based on your symptoms and your exam. Blood and urine tests will help confirm the diagnosis. The diagnostic evaluation should also identify the cause of dehydration. TREATMENT  Treatment of mild or moderate dehydration can often be done at home by increasing the amount of fluids that you drink. It is best to drink small amounts of fluid more often. Drinking too much at one time can make vomiting worse. Refer to the home care instructions below. Severe dehydration needs to be treated at the hospital where you will probably be given intravenous (IV) fluids that contain water and electrolytes. HOME CARE INSTRUCTIONS   Ask your caregiver about specific rehydration instructions.  Drink enough fluids to keep your urine clear or pale yellow.  Drink small amounts frequently if you have nausea and vomiting.  Eat as you normally do.  Avoid:  Foods or drinks high in sugar.  Carbonated  drinks.  Juice.  Extremely hot or cold fluids.  Drinks with caffeine.  Fatty, greasy foods.  Alcohol.  Tobacco.  Overeating.  Gelatin desserts.  Wash your hands well to avoid spreading bacteria and viruses.  Only take over-the-counter or prescription medicines for pain, discomfort, or fever as directed by your caregiver.  Ask your caregiver if you should continue all prescribed and over-the-counter medicines.  Keep all follow-up appointments with your caregiver. SEEK MEDICAL CARE IF:  You have abdominal pain and it increases or stays in one area (localizes).  You have a rash, stiff neck, or severe headache.  You are irritable, sleepy, or difficult to awaken.  You are weak, dizzy, or extremely thirsty. SEEK IMMEDIATE MEDICAL CARE IF:   You are unable to keep fluids down or you get worse despite treatment.  You have frequent episodes of vomiting or diarrhea.  You have blood or green matter (bile) in your vomit.  You have blood in your stool or your stool looks black and tarry.  You have not urinated in 6 to 8 hours, or you have only urinated a small amount of very dark urine.  You have a fever.  You faint. MAKE SURE YOU:   Understand these instructions.  Will watch your condition.  Will get help right away if you are not doing well or get worse. Document Released: 01/24/2005 Document Revised: 04/18/2011 Document Reviewed: 09/13/2010 ExitCare Patient Information 2015 [employer]. This information is not intended to replace advice given to you by your health care provider. Make sure you discuss any questions you have with your health care   provider.  

## 2013-11-30 ENCOUNTER — Ambulatory Visit (HOSPITAL_BASED_OUTPATIENT_CLINIC_OR_DEPARTMENT_OTHER): Payer: BC Managed Care – PPO

## 2013-11-30 VITALS — BP 116/74 | HR 87 | Temp 99.3°F | Resp 20

## 2013-11-30 DIAGNOSIS — R748 Abnormal levels of other serum enzymes: Secondary | ICD-10-CM

## 2013-11-30 DIAGNOSIS — R7989 Other specified abnormal findings of blood chemistry: Secondary | ICD-10-CM

## 2013-11-30 DIAGNOSIS — R1310 Dysphagia, unspecified: Secondary | ICD-10-CM

## 2013-11-30 DIAGNOSIS — R112 Nausea with vomiting, unspecified: Secondary | ICD-10-CM

## 2013-11-30 MED ORDER — SODIUM CHLORIDE 0.9 % IJ SOLN
3.0000 mL | Freq: Once | INTRAMUSCULAR | Status: AC
  Administered 2013-11-30: 3 mL via INTRAVENOUS
  Filled 2013-11-30: qty 10

## 2013-11-30 MED ORDER — SODIUM CHLORIDE 0.9 % IJ SOLN
3.0000 mL | INTRAMUSCULAR | Status: DC | PRN
  Administered 2013-11-30: 11:00:00 via INTRAVENOUS
  Filled 2013-11-30: qty 10

## 2013-11-30 MED ORDER — HEPARIN SOD (PORK) LOCK FLUSH 100 UNIT/ML IV SOLN
250.0000 [IU] | Freq: Once | INTRAVENOUS | Status: AC
  Administered 2013-11-30: 11:00:00 via INTRAVENOUS
  Filled 2013-11-30: qty 5

## 2013-11-30 MED ORDER — SODIUM CHLORIDE 0.9 % IV SOLN
Freq: Once | INTRAVENOUS | Status: AC
  Administered 2013-11-30: 09:00:00 via INTRAVENOUS

## 2013-11-30 NOTE — Patient Instructions (Signed)
Dehydration, Adult Dehydration is when you lose more fluids from the body than you take in. Vital organs like the kidneys, brain, and heart cannot function without a proper amount of fluids and salt. Any loss of fluids from the body can cause dehydration.  CAUSES   Vomiting.  Diarrhea.  Excessive sweating.  Excessive urine output.  Fever. SYMPTOMS  Mild dehydration  Thirst.  Dry lips.  Slightly dry mouth. Moderate dehydration  Very dry mouth.  Sunken eyes.  Skin does not bounce back quickly when lightly pinched and released.  Dark urine and decreased urine production.  Decreased tear production.  Headache. Severe dehydration  Very dry mouth.  Extreme thirst.  Rapid, weak pulse (more than 100 beats per minute at rest).  Cold hands and feet.  Not able to sweat in spite of heat and temperature.  Rapid breathing.  Blue lips.  Confusion and lethargy.  Difficulty being awakened.  Minimal urine production.  No tears. DIAGNOSIS  Your caregiver will diagnose dehydration based on your symptoms and your exam. Blood and urine tests will help confirm the diagnosis. The diagnostic evaluation should also identify the cause of dehydration. TREATMENT  Treatment of mild or moderate dehydration can often be done at home by increasing the amount of fluids that you drink. It is best to drink small amounts of fluid more often. Drinking too much at one time can make vomiting worse. Refer to the home care instructions below. Severe dehydration needs to be treated at the hospital where you will probably be given intravenous (IV) fluids that contain water and electrolytes. HOME CARE INSTRUCTIONS   Ask your caregiver about specific rehydration instructions.  Drink enough fluids to keep your urine clear or pale yellow.  Drink small amounts frequently if you have nausea and vomiting.  Eat as you normally do.  Avoid:  Foods or drinks high in sugar.  Carbonated  drinks.  Juice.  Extremely hot or cold fluids.  Drinks with caffeine.  Fatty, greasy foods.  Alcohol.  Tobacco.  Overeating.  Gelatin desserts.  Wash your hands well to avoid spreading bacteria and viruses.  Only take over-the-counter or prescription medicines for pain, discomfort, or fever as directed by your caregiver.  Ask your caregiver if you should continue all prescribed and over-the-counter medicines.  Keep all follow-up appointments with your caregiver. SEEK MEDICAL CARE IF:  You have abdominal pain and it increases or stays in one area (localizes).  You have a rash, stiff neck, or severe headache.  You are irritable, sleepy, or difficult to awaken.  You are weak, dizzy, or extremely thirsty. SEEK IMMEDIATE MEDICAL CARE IF:   You are unable to keep fluids down or you get worse despite treatment.  You have frequent episodes of vomiting or diarrhea.  You have blood or green matter (bile) in your vomit.  You have blood in your stool or your stool looks black and tarry.  You have not urinated in 6 to 8 hours, or you have only urinated a small amount of very dark urine.  You have a fever.  You faint. MAKE SURE YOU:   Understand these instructions.  Will watch your condition.  Will get help right away if you are not doing well or get worse. Document Released: 01/24/2005 Document Revised: 04/18/2011 Document Reviewed: 09/13/2010 ExitCare Patient Information 2015 [employer]. This information is not intended to replace advice given to you by your health care provider. Make sure you discuss any questions you have with your health care   provider.  

## 2013-12-02 ENCOUNTER — Ambulatory Visit: Payer: BC Managed Care – PPO

## 2013-12-02 ENCOUNTER — Ambulatory Visit
Admission: RE | Admit: 2013-12-02 | Discharge: 2013-12-02 | Disposition: A | Discharge status: Home / Self Care | Payer: BC Managed Care – PPO | Source: Ambulatory Visit | Attending: Radiation Oncology | Admitting: Radiation Oncology

## 2013-12-02 ENCOUNTER — Other Ambulatory Visit (HOSPITAL_BASED_OUTPATIENT_CLINIC_OR_DEPARTMENT_OTHER): Payer: BC Managed Care – PPO

## 2013-12-02 ENCOUNTER — Ambulatory Visit: Payer: BC Managed Care – PPO | Admitting: Nutrition

## 2013-12-02 ENCOUNTER — Other Ambulatory Visit: Payer: Self-pay | Admitting: Hematology and Oncology

## 2013-12-02 ENCOUNTER — Ambulatory Visit (HOSPITAL_BASED_OUTPATIENT_CLINIC_OR_DEPARTMENT_OTHER): Payer: BC Managed Care – PPO

## 2013-12-02 ENCOUNTER — Encounter: Payer: Self-pay | Admitting: Radiation Oncology

## 2013-12-02 VITALS — BP 124/73 | HR 88 | Temp 98.6°F | Resp 18

## 2013-12-02 VITALS — BP 124/73 | HR 88 | Temp 98.6°F | Resp 18 | Wt 202.1 lb

## 2013-12-02 DIAGNOSIS — Z510 Encounter for antineoplastic radiation therapy: Secondary | ICD-10-CM | POA: Diagnosis not present

## 2013-12-02 DIAGNOSIS — C01 Malignant neoplasm of base of tongue: Secondary | ICD-10-CM

## 2013-12-02 DIAGNOSIS — Z452 Encounter for adjustment and management of vascular access device: Secondary | ICD-10-CM

## 2013-12-02 DIAGNOSIS — R1310 Dysphagia, unspecified: Secondary | ICD-10-CM

## 2013-12-02 DIAGNOSIS — R112 Nausea with vomiting, unspecified: Secondary | ICD-10-CM

## 2013-12-02 DIAGNOSIS — R748 Abnormal levels of other serum enzymes: Secondary | ICD-10-CM

## 2013-12-02 DIAGNOSIS — R7989 Other specified abnormal findings of blood chemistry: Secondary | ICD-10-CM

## 2013-12-02 LAB — COMPREHENSIVE METABOLIC PANEL (CC13)
ALT: 17 U/L (ref 0–55)
AST: 14 U/L (ref 5–34)
Albumin: 2.6 g/dL — ABNORMAL LOW (ref 3.5–5.0)
Alkaline Phosphatase: 59 U/L (ref 40–150)
Anion Gap: 7 mEq/L (ref 3–11)
BUN: 17.7 mg/dL (ref 7.0–26.0)
CO2: 29 mEq/L (ref 22–29)
Calcium: 9.1 mg/dL (ref 8.4–10.4)
Chloride: 99 mEq/L (ref 98–109)
Creatinine: 1.1 mg/dL (ref 0.7–1.3)
Glucose: 198 mg/dl — ABNORMAL HIGH (ref 70–140)
Potassium: 3.6 mEq/L (ref 3.5–5.1)
Sodium: 135 mEq/L — ABNORMAL LOW (ref 136–145)
Total Bilirubin: 0.25 mg/dL (ref 0.20–1.20)
Total Protein: 6.4 g/dL (ref 6.4–8.3)

## 2013-12-02 LAB — CBC WITH DIFFERENTIAL/PLATELET
BASO%: 0.3 % (ref 0.0–2.0)
Basophils Absolute: 0 10*3/uL (ref 0.0–0.1)
EOS%: 2.9 % (ref 0.0–7.0)
Eosinophils Absolute: 0.1 10*3/uL (ref 0.0–0.5)
HCT: 30.8 % — ABNORMAL LOW (ref 38.4–49.9)
HGB: 10.5 g/dL — ABNORMAL LOW (ref 13.0–17.1)
LYMPH%: 16.3 % (ref 14.0–49.0)
MCH: 30.9 pg (ref 27.2–33.4)
MCHC: 34.1 g/dL (ref 32.0–36.0)
MCV: 90.6 fL (ref 79.3–98.0)
MONO#: 1 10*3/uL — ABNORMAL HIGH (ref 0.1–0.9)
MONO%: 31.6 % — ABNORMAL HIGH (ref 0.0–14.0)
NEUT#: 1.5 10*3/uL (ref 1.5–6.5)
NEUT%: 48.9 % (ref 39.0–75.0)
Platelets: 528 10*3/uL — ABNORMAL HIGH (ref 140–400)
RBC: 3.4 10*6/uL — ABNORMAL LOW (ref 4.20–5.82)
RDW: 13.8 % (ref 11.0–14.6)
WBC: 3.1 10*3/uL — ABNORMAL LOW (ref 4.0–10.3)
lymph#: 0.5 10*3/uL — ABNORMAL LOW (ref 0.9–3.3)
nRBC: 0 % (ref 0–0)

## 2013-12-02 LAB — TECHNOLOGIST REVIEW

## 2013-12-02 LAB — MAGNESIUM (CC13): Magnesium: 2.1 mg/dl (ref 1.5–2.5)

## 2013-12-02 MED ORDER — HEPARIN SOD (PORK) LOCK FLUSH 100 UNIT/ML IV SOLN
250.0000 [IU] | Freq: Once | INTRAVENOUS | Status: AC | PRN
  Administered 2013-12-02: 250 [IU]
  Filled 2013-12-02: qty 5

## 2013-12-02 MED ORDER — SODIUM CHLORIDE 0.9 % IJ SOLN
10.0000 mL | INTRAMUSCULAR | Status: DC | PRN
  Administered 2013-12-02 (×2): 10 mL
  Filled 2013-12-02: qty 10

## 2013-12-02 MED ORDER — SODIUM CHLORIDE 0.9 % IV SOLN
Freq: Once | INTRAVENOUS | Status: AC
  Administered 2013-12-02: 09:00:00 via INTRAVENOUS

## 2013-12-02 MED ORDER — SODIUM CHLORIDE 0.9 % IJ SOLN
10.0000 mL | INTRAMUSCULAR | Status: DC | PRN
  Filled 2013-12-02: qty 10

## 2013-12-02 NOTE — Progress Notes (Signed)
Nutrition followup completed with patient while he was receiving IV fluids. Weight has decreased and was documented as 202.1 pounds. Patient is unable to eat by mouth secondary to thick mucus. Patient continues to tolerate Osmolite 1.5 - 4-6 cans daily, via his feeding tube. Labs were reviewed.  Nutrition diagnosis: Inadequate oral intake continues.  Intervention: Encouraged patient to strive for Osmolite 1.5 - 6 cans daily minimum with eventual goal of 7 cans daily, to provide 2485 calories, 104 g protein.   Patient to continue free water flushes.   Recommended patient take nausea medication if he is feeling nauseated.  Monitoring, evaluation, goals: Patient will tolerate tube feedings at goal to minimize weight loss.  Next visit: Will follow as needed throughout remaining treatments.  **Disclaimer: This note was dictated with voice recognition software. Similar sounding words can inadvertently be transcribed and this note may contain transcription errors which may not have been corrected upon publication of note.**

## 2013-12-02 NOTE — Progress Notes (Signed)
Weekly Management Note:  Site: Right base of tongue/tonsil/neck Current Dose:  4000  cGy Projected Dose: 7000  cGy  Narrative: The patient is seen today for routine under treatment assessment. CBCT/MVCT images/port films were reviewed. The chart was reviewed.   He had a fever this past Thursday of 101 and was started on Augmentin by Dr. Alvy Bimler. His weight is down approximately 4 pounds in the past week. He has had some nausea, which he felt was worse with metoclopramide. He is holding metoclopramide. He continues with his 25 g fentanyl patch along with oxycodone when necessary breakthrough pain. He is receiving IV fluids daily. He is using saline gargles for his thickened secretions which he believes is causing his gagging. Chemotherapy is being held today, but this may be resumed next week.  Physical Examination:  Filed Vitals:   12/02/13 0956  BP: 124/73  Pulse: 88  Temp: 98.6 F (37 C)  Resp: 18  .  Weight: 202 lb 1.6 oz (91.672 kg). There is erythema along the neck with no areas of desquamation. There has been further regression of his right neck adenopathy. On inspection of the oral cavity/oropharynx, there are thickened secretions. No candidiasis.  Impression: Tolerating radiation therapy well, except for nausea, presumably from thickened secretions, resulting in weight loss of 4 pounds.  Plan: Continue radiation therapy as planned.

## 2013-12-02 NOTE — Patient Instructions (Signed)
Dehydration, Adult Dehydration is when you lose more fluids from the body than you take in. Vital organs like the kidneys, brain, and heart cannot function without a proper amount of fluids and salt. Any loss of fluids from the body can cause dehydration.  CAUSES   Vomiting.  Diarrhea.  Excessive sweating.  Excessive urine output.  Fever. SYMPTOMS  Mild dehydration  Thirst.  Dry lips.  Slightly dry mouth. Moderate dehydration  Very dry mouth.  Sunken eyes.  Skin does not bounce back quickly when lightly pinched and released.  Dark urine and decreased urine production.  Decreased tear production.  Headache. Severe dehydration  Very dry mouth.  Extreme thirst.  Rapid, weak pulse (more than 100 beats per minute at rest).  Cold hands and feet.  Not able to sweat in spite of heat and temperature.  Rapid breathing.  Blue lips.  Confusion and lethargy.  Difficulty being awakened.  Minimal urine production.  No tears. DIAGNOSIS  Your caregiver will diagnose dehydration based on your symptoms and your exam. Blood and urine tests will help confirm the diagnosis. The diagnostic evaluation should also identify the cause of dehydration. TREATMENT  Treatment of mild or moderate dehydration can often be done at home by increasing the amount of fluids that you drink. It is best to drink small amounts of fluid more often. Drinking too much at one time can make vomiting worse. Refer to the home care instructions below. Severe dehydration needs to be treated at the hospital where you will probably be given intravenous (IV) fluids that contain water and electrolytes. HOME CARE INSTRUCTIONS   Ask your caregiver about specific rehydration instructions.  Drink enough fluids to keep your urine clear or pale yellow.  Drink small amounts frequently if you have nausea and vomiting.  Eat as you normally do.  Avoid:  Foods or drinks high in sugar.  Carbonated  drinks.  Juice.  Extremely hot or cold fluids.  Drinks with caffeine.  Fatty, greasy foods.  Alcohol.  Tobacco.  Overeating.  Gelatin desserts.  Wash your hands well to avoid spreading bacteria and viruses.  Only take over-the-counter or prescription medicines for pain, discomfort, or fever as directed by your caregiver.  Ask your caregiver if you should continue all prescribed and over-the-counter medicines.  Keep all follow-up appointments with your caregiver. SEEK MEDICAL CARE IF:  You have abdominal pain and it increases or stays in one area (localizes).  You have a rash, stiff neck, or severe headache.  You are irritable, sleepy, or difficult to awaken.  You are weak, dizzy, or extremely thirsty. SEEK IMMEDIATE MEDICAL CARE IF:   You are unable to keep fluids down or you get worse despite treatment.  You have frequent episodes of vomiting or diarrhea.  You have blood or green matter (bile) in your vomit.  You have blood in your stool or your stool looks black and tarry.  You have not urinated in 6 to 8 hours, or you have only urinated a small amount of very dark urine.  You have a fever.  You faint. MAKE SURE YOU:   Understand these instructions.  Will watch your condition.  Will get help right away if you are not doing well or get worse. Document Released: 01/24/2005 Document Revised: 04/18/2011 Document Reviewed: 09/13/2010 ExitCare Patient Information 2015 [employer]. This information is not intended to replace advice given to you by your health care provider. Make sure you discuss any questions you have with your health care   provider.  

## 2013-12-02 NOTE — Progress Notes (Signed)
Patient receiving IVF today and chemotherapy. VS taken in med onc, afebrile. Patient reports pain in throat 4/10; he is wearing Fentanyl patch, Scopolamine patch. He takes Oxycodone occasionally but states it makes him sleepy. He is taking Zofran regularly, has some nausea typically associated with thick saliva. He was started on Augmentin last Thurs due to fever of 101. He is taking in Osmolite 4 cans daily per peg tube with no issues. He is applying lotion to skin of right neck for slight dry desquamation and darkening of skin. He is fatigued.

## 2013-12-02 NOTE — Patient Instructions (Signed)
PICC Home Guide A peripherally inserted central catheter (PICC) is a long, thin, flexible tube that is inserted into a vein in the upper arm. It is a form of intravenous (IV) access. It is considered to be a "central" line because the tip of the PICC ends in a large vein in your chest. This large vein is called the superior vena cava (SVC). The PICC tip ends in the SVC because there is a lot of blood flow in the SVC. This allows medicines and IV fluids to be quickly distributed throughout the body. The PICC is inserted using a sterile technique by a specially trained nurse or physician. After the PICC is inserted, a chest X-ray exam is done to be sure it is in the correct place.  A PICC may be placed for different reasons, such as:  To give medicines and liquid nutrition that can only be given through a central line. Examples are:  Certain antibiotic treatments.  Chemotherapy.  Total parenteral nutrition (TPN).  To take frequent blood samples.  To give IV fluids and blood products.  If there is difficulty placing a peripheral intravenous (PIV) catheter. If taken care of properly, a PICC can remain in place for several months. A PICC can also allow a person to go home from the hospital early. Medicine and PICC care can be managed at home by a family member or home health care team. WHAT PROBLEMS CAN HAPPEN WHEN I HAVE A PICC? Problems with a PICC can occasionally occur. These may include the following:  A blood clot (thrombus) forming in or at the tip of the PICC. This can cause the PICC to become clogged. A clot-dissolving medicine called tissue plasminogen activator (tPA) can be given through the PICC to help break up the clot.  Inflammation of the vein (phlebitis) in which the PICC is placed. Signs of inflammation may include redness, pain at the insertion site, red streaks, or being able to feel a "cord" in the vein where the PICC is located.  Infection in the PICC or at the insertion  site. Signs of infection may include fever, chills, redness, swelling, or pus drainage from the PICC insertion site.  PICC movement (malposition). The PICC tip may move from its original position due to excessive physical activity, forceful coughing, sneezing, or vomiting.  A break or cut in the PICC. It is important to not use scissors near the PICC.  Nerve or tendon irritation or injury during PICC insertion. WHAT SHOULD I KEEP IN MIND ABOUT ACTIVITIES WHEN I HAVE A PICC?  You may bend your arm and move it freely. If your PICC is near or at the bend of your elbow, avoid activity with repeated motion at the elbow.  Rest at home for the remainder of the day following PICC line insertion.  Avoid lifting heavy objects as instructed by your health care provider.  Avoid using a crutch with the arm on the same side as your PICC. You may need to use a walker. WHAT SHOULD I KNOW ABOUT MY PICC DRESSING?  Keep your PICC bandage (dressing) clean and dry to prevent infection.  Ask your health care provider when you may shower. Ask your health care provider to teach you how to wrap the PICC when you do take a shower.  Change the PICC dressing as instructed by your health care provider.  Change your PICC dressing if it becomes loose or wet. WHAT SHOULD I KNOW ABOUT PICC CARE?  Check the PICC insertion site   daily for leakage, redness, swelling, or pain.  Do not take a bath, swim, or use hot tubs when you have a PICC. Cover PICC line with clear plastic wrap and tape to keep it dry while showering.  Flush the PICC as directed by your health care provider. Let your health care provider know right away if the PICC is difficult to flush or does not flush. Do not use force to flush the PICC.  Do not use a syringe that is less than 10 mL to flush the PICC.  Never pull or tug on the PICC.  Avoid blood pressure checks on the arm with the PICC.  Keep your PICC identification card with you at all  times.  Do not take the PICC out yourself. Only a trained clinical professional should remove the PICC. SEEK IMMEDIATE MEDICAL CARE IF:  Your PICC is accidentally pulled all the way out. If this happens, cover the insertion site with a bandage or gauze dressing. Do not throw the PICC away. Your health care provider will need to inspect it.  Your PICC was tugged or pulled and has partially come out. Do not  push the PICC back in.  There is any type of drainage, redness, or swelling where the PICC enters the skin.  You cannot flush the PICC, it is difficult to flush, or the PICC leaks around the insertion site when it is flushed.  You hear a "flushing" sound when the PICC is flushed.  You have pain, discomfort, or numbness in your arm, shoulder, or jaw on the same side as the PICC.  You feel your heart "racing" or skipping beats.  You notice a hole or tear in the PICC.  You develop chills or a fever. MAKE SURE YOU:   Understand these instructions.  Will watch your condition.  Will get help right away if you are not doing well or get worse. Document Released: 07/31/2002 Document Revised: 06/10/2013 Document Reviewed: 10/01/2012 [employer] Patient Information 2015 [employer]. This information is not intended to replace advice given to you by your health care provider. Make sure you discuss any questions you have with your health care provider.  

## 2013-12-03 ENCOUNTER — Ambulatory Visit
Admission: RE | Admit: 2013-12-03 | Discharge: 2013-12-03 | Disposition: A | Discharge status: Home / Self Care | Payer: BC Managed Care – PPO | Source: Ambulatory Visit | Attending: Radiation Oncology | Admitting: Radiation Oncology

## 2013-12-03 ENCOUNTER — Telehealth: Payer: Self-pay | Admitting: Hematology and Oncology

## 2013-12-03 ENCOUNTER — Encounter: Payer: Self-pay | Admitting: Hematology and Oncology

## 2013-12-03 ENCOUNTER — Encounter: Payer: Self-pay | Admitting: Nutrition

## 2013-12-03 ENCOUNTER — Telehealth: Payer: Self-pay | Admitting: *Deleted

## 2013-12-03 ENCOUNTER — Ambulatory Visit (HOSPITAL_BASED_OUTPATIENT_CLINIC_OR_DEPARTMENT_OTHER): Payer: BC Managed Care – PPO | Admitting: Hematology and Oncology

## 2013-12-03 ENCOUNTER — Ambulatory Visit (HOSPITAL_BASED_OUTPATIENT_CLINIC_OR_DEPARTMENT_OTHER): Payer: BC Managed Care – PPO

## 2013-12-03 VITALS — BP 111/65 | HR 71 | Temp 98.6°F | Resp 16

## 2013-12-03 DIAGNOSIS — E46 Unspecified protein-calorie malnutrition: Secondary | ICD-10-CM

## 2013-12-03 DIAGNOSIS — R7989 Other specified abnormal findings of blood chemistry: Secondary | ICD-10-CM

## 2013-12-03 DIAGNOSIS — Z95828 Presence of other vascular implants and grafts: Secondary | ICD-10-CM

## 2013-12-03 DIAGNOSIS — Z931 Gastrostomy status: Secondary | ICD-10-CM

## 2013-12-03 DIAGNOSIS — R070 Pain in throat: Secondary | ICD-10-CM

## 2013-12-03 DIAGNOSIS — Z9889 Other specified postprocedural states: Secondary | ICD-10-CM

## 2013-12-03 DIAGNOSIS — R5081 Fever presenting with conditions classified elsewhere: Secondary | ICD-10-CM

## 2013-12-03 DIAGNOSIS — R112 Nausea with vomiting, unspecified: Secondary | ICD-10-CM

## 2013-12-03 DIAGNOSIS — R1310 Dysphagia, unspecified: Secondary | ICD-10-CM

## 2013-12-03 DIAGNOSIS — Z510 Encounter for antineoplastic radiation therapy: Secondary | ICD-10-CM | POA: Diagnosis not present

## 2013-12-03 DIAGNOSIS — R748 Abnormal levels of other serum enzymes: Secondary | ICD-10-CM

## 2013-12-03 DIAGNOSIS — D709 Neutropenia, unspecified: Secondary | ICD-10-CM

## 2013-12-03 DIAGNOSIS — C01 Malignant neoplasm of base of tongue: Secondary | ICD-10-CM

## 2013-12-03 DIAGNOSIS — R110 Nausea: Secondary | ICD-10-CM

## 2013-12-03 MED ORDER — FENTANYL 25 MCG/HR TD PT72: 25.0000 ug | MEDICATED_PATCH | TRANSDERMAL | Status: DC

## 2013-12-03 MED ORDER — HEPARIN SOD (PORK) LOCK FLUSH 100 UNIT/ML IV SOLN
250.0000 [IU] | Freq: Once | INTRAVENOUS | Status: AC | PRN
  Administered 2013-12-03: 250 [IU]
  Filled 2013-12-03: qty 5

## 2013-12-03 MED ORDER — SODIUM CHLORIDE 0.9 % IV SOLN
Freq: Once | INTRAVENOUS | Status: AC
  Administered 2013-12-03: 08:00:00 via INTRAVENOUS

## 2013-12-03 MED ORDER — SODIUM CHLORIDE 0.9 % IJ SOLN
10.0000 mL | INTRAMUSCULAR | Status: DC | PRN
  Administered 2013-12-03: 10 mL
  Filled 2013-12-03: qty 10

## 2013-12-03 MED ORDER — ONDANSETRON 8 MG/50ML IVPB (CHCC): 8.0000 mg | Freq: Every day | INTRAVENOUS | Status: DC | PRN

## 2013-12-03 NOTE — Assessment & Plan Note (Signed)
This is getting severe. Recommend him to continue taking fentanyl patch and breakthrough oxycodone as needed.

## 2013-12-03 NOTE — Assessment & Plan Note (Signed)
Recommend PICC line care with flushes.

## 2013-12-03 NOTE — Telephone Encounter (Signed)
Per staff message from MD I have scheduled appts. Patient given calendar. Marland Kitchen JMW

## 2013-12-03 NOTE — Progress Notes (Signed)
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 There are right level 2 and level 3 adenopathy as described and right peritracheal lymph node within the superior mediastinum   09/12/2013 Surgery Laryngoscopy showed the oropharynx revealed a firm friable mass involving the lateral base of tongue and inferior tonsillar fossa.    09/13/2013 Pathology Results Accession: [id number] biopsy confirmed squamous cell carcinoma, HPV positive   09/25/2013 Imaging PET scan showed right base of tongue mass exhibits intense FDG uptake compatible with primary head neck carcinoma with associated hypermetabolic right level to adenopathy   10/03/2013 Procedure He had dental extraction   10/21/2013 -  Chemotherapy He start on high dose cisplatin   11/04/2013 -  Radiation Therapy He received radiation therapy   11/19/2013 Procedure He had placement of right PICC line and removal of port    INTERVAL HISTORY: Please see below for problem oriented charting. He is seen as part of his weekly supportive care visit. Over the weekend, his symptoms have slowly improved. He denies recent fever. He continues to have mild nausea and had one episode of vomiting thought to be related to gagging from  mucous production in his throat. He has lost some weight. Denies recent constipation. The fentanyl patch is controlling his pain well.  REVIEW OF SYSTEMS:   Constitutional: Denies fevers, chills  Eyes: Denies blurriness of vision Respiratory: Denies cough, dyspnea or wheezes Cardiovascular: Denies palpitation, chest discomfort or lower extremity swelling Skin: Denies abnormal skin rashes Lymphatics: Denies new lymphadenopathy or easy bruising Neurological:Denies numbness, tingling or new weaknesses Behavioral/Psych: Mood is stable, no new changes  All other systems were reviewed with the patient and are negative.  I have reviewed the past medical history, past surgical history, social history and family history with the patient and they are unchanged from previous note.  ALLERGIES:  is allergic to codeine and velosef.  MEDICATIONS:  Current Outpatient Prescriptions  Medication Sig Dispense Refill  . amoxicillin-clavulanate (AUGMENTIN) 400-57 MG/5ML suspension Take 400 mg by mouth 2 (two) times daily.      . fentaNYL (DURAGESIC - DOSED MCG/HR) 25 MCG/HR patch Place 1 patch (25 mcg total) onto the skin every 3 (three) days.  5 patch  0  . hyaluronate sodium (RADIAPLEXRX) GEL Apply 1 application topically 2 (two) times daily.      Marland Kitchen HYDROcodone-acetaminophen (NORCO) 7.5-325 MG per tablet Take 1-2 tablets by mouth every 4 (four) hours as needed for moderate pain.  50 tablet  0  . lidocaine-prilocaine (EMLA) cream Apply 1 application topically as needed (per port-a-cath).      . metoCLOPramide (REGLAN) 5 MG/5ML solution Take 10 mLs (10 mg total) by mouth 4 (four) times daily -  before meals  and at bedtime.  480 mL  3  . Nutritional Supplements (FEEDING SUPPLEMENT, OSMOLITE 1.5 CAL,) LIQD Begin 1 can Osmolite 1.5 QID with 60 ml free water before and after bolus feeding.  Increase to 1.5 cans QID on day 2.  Increase to goal of 1.5 cans BID and 2 cans BID day 3.  Flush with additional 240 cc  water TID between feedings. Send formula please.  1659 mL  0  . ondansetron (ZOFRAN) 8 MG tablet Take 1 tablet (8 mg total) by mouth every 8 (eight) hours as needed for nausea or vomiting.  60 tablet  6  . oxyCODONE 10 MG TABS Take 1 tablet (10 mg total) by mouth every 4 (four) hours as needed for severe pain.  60 tablet  0  . prochlorperazine (COMPAZINE) 10 MG tablet Take 1 tablet (10 mg total) by mouth every 6 (six) hours as needed for nausea or vomiting.  60 tablet  3  . prochlorperazine (COMPAZINE) 10 MG tablet TAKE 1 TABLET BY MOUTH EVERY 6 HOURS AS NEEDED FOR NAUSEA OR VOMITING  60 tablet  1  . scopolamine (TRANSDERM-SCOP) 1 MG/3DAYS Place 1 patch (1.5 mg total) onto the skin every 3 (three) days.  10 patch  12  . Sodium Chloride Flush 0.9 % SOLN injection Inject 10 mLs into the vein daily.  100 Syringe  3  . sodium fluoride (FLUORISHIELD) 1.1 % GEL dental gel Place 1 application onto teeth at bedtime.       No current facility-administered medications for this visit.   Facility-Administered Medications Ordered in Other Visits  Medication Dose Route Frequency Provider Last Rate Last Dose  . heparin lock flush 100 unit/mL  250 Units Intracatheter Once PRN Heath Lark, MD      . ondansetron (ZOFRAN) IVPB 8 mg  8 mg Intravenous Daily PRN Ni Gorsuch, MD      . sodium chloride 0.9 % injection 10 mL  10 mL Intracatheter PRN Heath Lark, MD        PHYSICAL EXAMINATION: ECOG PERFORMANCE STATUS: 1 - Symptomatic but completely ambulatory  Filed Vitals:   12/03/13 0815  BP: 111/65  Pulse: 71  Temp: 98.6 F (37 C)  Resp: 16   There were no vitals filed for this visit.  GENERAL:alert, no distress and comfortable SKIN: skin color, texture, turgor are normal, no rashes or significant lesions. Noted radiation-induced dermatitis but no ulceration EYES: normal, Conjunctiva are pink and non-injected, sclera clear OROPHARYNX: Mucositis is present. No thrush. NECK: supple, thyroid normal size,  non-tender, without nodularity LYMPH:  no palpable lymphadenopathy in the cervical, axillary or inguinal LUNGS: clear to auscultation and percussion with normal breathing effort HEART: regular rate & rhythm and no murmurs and no lower extremity edema ABDOMEN:abdomen soft, non-tender and normal bowel sounds. Feeding tube site looks okay Musculoskeletal:no cyanosis of digits and no clubbing  NEURO: alert & oriented x 3 with fluent speech, no focal motor/sensory deficits  LABORATORY DATA:  I have reviewed the data as listed    Component Value Date/Time   NA 135* 12/02/2013 0811   NA 134* 11/27/2013 0813   K 3.6 12/02/2013 0811   K 4.1 11/27/2013 0813   CL 95* 11/27/2013 0813   CO2 29 12/02/2013 0811   CO2 28 11/27/2013 0813   GLUCOSE 198* 12/02/2013 0811   GLUCOSE 105* 11/27/2013 0813   BUN 17.7 12/02/2013 0811   BUN 19 11/27/2013 0813   CREATININE 1.1 12/02/2013 0811   CREATININE 1.16 11/27/2013 0813  CALCIUM 9.1 12/02/2013 0811   CALCIUM 8.9 11/27/2013 0813   PROT 6.4 12/02/2013 0811   PROT 6.9 11/27/2013 0813   ALBUMIN 2.6* 12/02/2013 0811   ALBUMIN 3.2* 11/27/2013 0813   AST 14 12/02/2013 0811   AST 13 11/27/2013 0813   ALT 17 12/02/2013 0811   ALT 10 11/27/2013 0813   ALKPHOS 59 12/02/2013 0811   ALKPHOS 61 11/27/2013 0813   BILITOT 0.25 12/02/2013 0811   BILITOT 0.6 11/27/2013 0813   GFRNONAA 40* 11/19/2013 1105   GFRAA 47* 11/19/2013 1105    No results found for this basename: SPEP, UPEP,  kappa and lambda light chains    Lab Results  Component Value Date   WBC 3.1* 12/02/2013   NEUTROABS 1.5 12/02/2013   HGB 10.5* 12/02/2013   HCT 30.8* 12/02/2013   MCV 90.6 12/02/2013   PLT 528* 12/02/2013      Chemistry      Component Value Date/Time   NA 135* 12/02/2013 0811   NA 134* 11/27/2013 0813   K 3.6 12/02/2013 0811   K 4.1 11/27/2013 0813   CL 95* 11/27/2013 0813   CO2 29 12/02/2013 0811   CO2 28 11/27/2013 0813   BUN 17.7 12/02/2013 0811   BUN 19  11/27/2013 0813   CREATININE 1.1 12/02/2013 0811   CREATININE 1.16 11/27/2013 0813      Component Value Date/Time   CALCIUM 9.1 12/02/2013 0811   CALCIUM 8.9 11/27/2013 0813   ALKPHOS 59 12/02/2013 0811   ALKPHOS 61 11/27/2013 0813   AST 14 12/02/2013 0811   AST 13 11/27/2013 0813   ALT 17 12/02/2013 0811   ALT 10 11/27/2013 0813   BILITOT 0.25 12/02/2013 0811   BILITOT 0.6 11/27/2013 0813      ASSESSMENT & PLAN:  Squamous cell carcinoma of the right base of tongue He had mild neutropenic fever, resolved. I would recommend we continue on aggressive supportive care. The patient is due for cycle 3 of treatment yesterday. I will delay until Friday and modify with 50% dose adjustment due to recent renal failure and neutropenic fever.   Elevated serum creatinine This is due to recent cisplatin chemotherapy and dehydration, has improved slightly with IV fluids. I recommend we continue IV fluids daily next week. I plan to reduce cycle 3 of chemotherapy with 50% dose adjustment.    Nausea without vomiting He will continue on Reglan before meals and scopolamine patch. I felt that that is a component of mucus gagging. He has benefited from IV fluids and I recommend we continue the same.     Protein calorie malnutrition This is getting worse. He has been seen by a dietitian who recommended increase tube feeds as tolerated.     S/P gastrostomy The feeding tube site looks okay with no signs of infection.     S/P PICC central line placement Recommend PICC line care with flushes.  Throat pain in adult This is getting severe. Recommend him to continue taking fentanyl patch and breakthrough oxycodone as needed.  Neutropenic fever This has resolved. He is no longer neutropenic.   No orders of the defined types were placed in this encounter.   All questions were answered. The patient knows to call the clinic with any problems, questions or concerns. No barriers to  learning was detected. I spent 30 minutes counseling the patient face to face. The total time spent in the appointment was 40 minutes and more than 50% was on counseling and review of test  results     [place], [place], MD 12/03/2013 9:06 AM

## 2013-12-03 NOTE — Assessment & Plan Note (Signed)
This has resolved. He is no longer neutropenic.

## 2013-12-03 NOTE — Assessment & Plan Note (Signed)
This is getting worse. He has been seen by a dietitian who recommended increase tube feeds as tolerated.

## 2013-12-03 NOTE — Assessment & Plan Note (Signed)
He had mild neutropenic fever, resolved. I would recommend we continue on aggressive supportive care. The patient is due for cycle 3 of treatment yesterday. I will delay until Friday and modify with 50% dose adjustment due to recent renal failure and neutropenic fever.

## 2013-12-03 NOTE — Patient Instructions (Signed)
Dehydration, Adult Dehydration is when you lose more fluids from the body than you take in. Vital organs like the kidneys, brain, and heart cannot function without a proper amount of fluids and salt. Any loss of fluids from the body can cause dehydration.  CAUSES   Vomiting.  Diarrhea.  Excessive sweating.  Excessive urine output.  Fever. SYMPTOMS  Mild dehydration  Thirst.  Dry lips.  Slightly dry mouth. Moderate dehydration  Very dry mouth.  Sunken eyes.  Skin does not bounce back quickly when lightly pinched and released.  Dark urine and decreased urine production.  Decreased tear production.  Headache. Severe dehydration  Very dry mouth.  Extreme thirst.  Rapid, weak pulse (more than 100 beats per minute at rest).  Cold hands and feet.  Not able to sweat in spite of heat and temperature.  Rapid breathing.  Blue lips.  Confusion and lethargy.  Difficulty being awakened.  Minimal urine production.  No tears. DIAGNOSIS  Your caregiver will diagnose dehydration based on your symptoms and your exam. Blood and urine tests will help confirm the diagnosis. The diagnostic evaluation should also identify the cause of dehydration. TREATMENT  Treatment of mild or moderate dehydration can often be done at home by increasing the amount of fluids that you drink. It is best to drink small amounts of fluid more often. Drinking too much at one time can make vomiting worse. Refer to the home care instructions below. Severe dehydration needs to be treated at the hospital where you will probably be given intravenous (IV) fluids that contain water and electrolytes. HOME CARE INSTRUCTIONS   Ask your caregiver about specific rehydration instructions.  Drink enough fluids to keep your urine clear or pale yellow.  Drink small amounts frequently if you have nausea and vomiting.  Eat as you normally do.  Avoid:  Foods or drinks high in sugar.  Carbonated  drinks.  Juice.  Extremely hot or cold fluids.  Drinks with caffeine.  Fatty, greasy foods.  Alcohol.  Tobacco.  Overeating.  Gelatin desserts.  Wash your hands well to avoid spreading bacteria and viruses.  Only take over-the-counter or prescription medicines for pain, discomfort, or fever as directed by your caregiver.  Ask your caregiver if you should continue all prescribed and over-the-counter medicines.  Keep all follow-up appointments with your caregiver. SEEK MEDICAL CARE IF:  You have abdominal pain and it increases or stays in one area (localizes).  You have a rash, stiff neck, or severe headache.  You are irritable, sleepy, or difficult to awaken.  You are weak, dizzy, or extremely thirsty. SEEK IMMEDIATE MEDICAL CARE IF:   You are unable to keep fluids down or you get worse despite treatment.  You have frequent episodes of vomiting or diarrhea.  You have blood or green matter (bile) in your vomit.  You have blood in your stool or your stool looks black and tarry.  You have not urinated in 6 to 8 hours, or you have only urinated a small amount of very dark urine.  You have a fever.  You faint. MAKE SURE YOU:   Understand these instructions.  Will watch your condition.  Will get help right away if you are not doing well or get worse. Document Released: 01/24/2005 Document Revised: 04/18/2011 Document Reviewed: 09/13/2010 ExitCare Patient Information 2015 [employer]. This information is not intended to replace advice given to you by your health care provider. Make sure you discuss any questions you have with your health care   provider.  

## 2013-12-03 NOTE — Assessment & Plan Note (Signed)
This is due to recent cisplatin chemotherapy and dehydration, has improved slightly with IV fluids. I recommend we continue IV fluids daily next week. I plan to reduce cycle 3 of chemotherapy with 50% dose adjustment.

## 2013-12-03 NOTE — Progress Notes (Signed)
Patient accompanied by wife, who answered assessment questions as patient is not able to speak well.

## 2013-12-03 NOTE — Assessment & Plan Note (Signed)
The feeding tube site looks okay with no signs of infection.

## 2013-12-03 NOTE — Assessment & Plan Note (Signed)
He will continue on Reglan before meals and scopolamine patch. I felt that that is a component of mucus gagging. He has benefited from IV fluids and I recommend we continue the same.

## 2013-12-03 NOTE — Telephone Encounter (Signed)
lvm for pt regarding to added appts....pt will get new print out tomorrow

## 2013-12-04 ENCOUNTER — Ambulatory Visit (HOSPITAL_BASED_OUTPATIENT_CLINIC_OR_DEPARTMENT_OTHER): Payer: BC Managed Care – PPO

## 2013-12-04 ENCOUNTER — Ambulatory Visit
Admission: RE | Admit: 2013-12-04 | Discharge: 2013-12-04 | Disposition: A | Discharge status: Home / Self Care | Payer: BC Managed Care – PPO | Source: Ambulatory Visit | Attending: Radiation Oncology | Admitting: Radiation Oncology

## 2013-12-04 VITALS — BP 108/67 | HR 77 | Temp 97.3°F | Resp 18

## 2013-12-04 DIAGNOSIS — Z510 Encounter for antineoplastic radiation therapy: Secondary | ICD-10-CM | POA: Diagnosis not present

## 2013-12-04 DIAGNOSIS — C01 Malignant neoplasm of base of tongue: Secondary | ICD-10-CM

## 2013-12-04 DIAGNOSIS — R7989 Other specified abnormal findings of blood chemistry: Secondary | ICD-10-CM

## 2013-12-04 DIAGNOSIS — R1310 Dysphagia, unspecified: Secondary | ICD-10-CM

## 2013-12-04 DIAGNOSIS — R112 Nausea with vomiting, unspecified: Secondary | ICD-10-CM

## 2013-12-04 MED ORDER — SODIUM CHLORIDE 0.9 % IJ SOLN
10.0000 mL | INTRAMUSCULAR | Status: DC | PRN
  Administered 2013-12-04: 10 mL
  Filled 2013-12-04: qty 10

## 2013-12-04 MED ORDER — SODIUM CHLORIDE 0.9 % IV SOLN
1000.0000 mL | Freq: Once | INTRAVENOUS | Status: AC
  Administered 2013-12-04: 08:00:00 via INTRAVENOUS

## 2013-12-04 MED ORDER — ALTEPLASE 2 MG IJ SOLR
2.0000 mg | Freq: Once | INTRAMUSCULAR | Status: DC | PRN
  Filled 2013-12-04: qty 2

## 2013-12-04 MED ORDER — HEPARIN SOD (PORK) LOCK FLUSH 100 UNIT/ML IV SOLN
250.0000 [IU] | Freq: Once | INTRAVENOUS | Status: AC | PRN
  Administered 2013-12-04: 250 [IU]
  Filled 2013-12-04: qty 5

## 2013-12-04 NOTE — Patient Instructions (Signed)
Dehydration, Adult Dehydration is when you lose more fluids from the body than you take in. Vital organs like the kidneys, brain, and heart cannot function without a proper amount of fluids and salt. Any loss of fluids from the body can cause dehydration.  CAUSES   Vomiting.  Diarrhea.  Excessive sweating.  Excessive urine output.  Fever. SYMPTOMS  Mild dehydration  Thirst.  Dry lips.  Slightly dry mouth. Moderate dehydration  Very dry mouth.  Sunken eyes.  Skin does not bounce back quickly when lightly pinched and released.  Dark urine and decreased urine production.  Decreased tear production.  Headache. Severe dehydration  Very dry mouth.  Extreme thirst.  Rapid, weak pulse (more than 100 beats per minute at rest).  Cold hands and feet.  Not able to sweat in spite of heat and temperature.  Rapid breathing.  Blue lips.  Confusion and lethargy.  Difficulty being awakened.  Minimal urine production.  No tears. DIAGNOSIS  Your caregiver will diagnose dehydration based on your symptoms and your exam. Blood and urine tests will help confirm the diagnosis. The diagnostic evaluation should also identify the cause of dehydration. TREATMENT  Treatment of mild or moderate dehydration can often be done at home by increasing the amount of fluids that you drink. It is best to drink small amounts of fluid more often. Drinking too much at one time can make vomiting worse. Refer to the home care instructions below. Severe dehydration needs to be treated at the hospital where you will probably be given intravenous (IV) fluids that contain water and electrolytes. HOME CARE INSTRUCTIONS   Ask your caregiver about specific rehydration instructions.  Drink enough fluids to keep your urine clear or pale yellow.  Drink small amounts frequently if you have nausea and vomiting.  Eat as you normally do.  Avoid:  Foods or drinks high in sugar.  Carbonated  drinks.  Juice.  Extremely hot or cold fluids.  Drinks with caffeine.  Fatty, greasy foods.  Alcohol.  Tobacco.  Overeating.  Gelatin desserts.  Wash your hands well to avoid spreading bacteria and viruses.  Only take over-the-counter or prescription medicines for pain, discomfort, or fever as directed by your caregiver.  Ask your caregiver if you should continue all prescribed and over-the-counter medicines.  Keep all follow-up appointments with your caregiver. SEEK MEDICAL CARE IF:  You have abdominal pain and it increases or stays in one area (localizes).  You have a rash, stiff neck, or severe headache.  You are irritable, sleepy, or difficult to awaken.  You are weak, dizzy, or extremely thirsty. SEEK IMMEDIATE MEDICAL CARE IF:   You are unable to keep fluids down or you get worse despite treatment.  You have frequent episodes of vomiting or diarrhea.  You have blood or green matter (bile) in your vomit.  You have blood in your stool or your stool looks black and tarry.  You have not urinated in 6 to 8 hours, or you have only urinated a small amount of very dark urine.  You have a fever.  You faint. MAKE SURE YOU:   Understand these instructions.  Will watch your condition.  Will get help right away if you are not doing well or get worse. Document Released: 01/24/2005 Document Revised: 04/18/2011 Document Reviewed: 09/13/2010 ExitCare Patient Information 2015 [employer]. This information is not intended to replace advice given to you by your health care provider. Make sure you discuss any questions you have with your health care   provider.  

## 2013-12-05 ENCOUNTER — Ambulatory Visit (HOSPITAL_BASED_OUTPATIENT_CLINIC_OR_DEPARTMENT_OTHER): Payer: BC Managed Care – PPO

## 2013-12-05 ENCOUNTER — Ambulatory Visit
Admission: RE | Admit: 2013-12-05 | Discharge: 2013-12-05 | Disposition: A | Discharge status: Home / Self Care | Payer: BC Managed Care – PPO | Source: Ambulatory Visit | Attending: Radiation Oncology | Admitting: Radiation Oncology

## 2013-12-05 VITALS — BP 115/79 | HR 70 | Temp 98.5°F

## 2013-12-05 DIAGNOSIS — R7989 Other specified abnormal findings of blood chemistry: Secondary | ICD-10-CM

## 2013-12-05 DIAGNOSIS — Z510 Encounter for antineoplastic radiation therapy: Secondary | ICD-10-CM | POA: Diagnosis not present

## 2013-12-05 DIAGNOSIS — R112 Nausea with vomiting, unspecified: Secondary | ICD-10-CM

## 2013-12-05 DIAGNOSIS — C01 Malignant neoplasm of base of tongue: Secondary | ICD-10-CM

## 2013-12-05 DIAGNOSIS — R1310 Dysphagia, unspecified: Secondary | ICD-10-CM

## 2013-12-05 MED ORDER — HEPARIN SOD (PORK) LOCK FLUSH 100 UNIT/ML IV SOLN
250.0000 [IU] | Freq: Once | INTRAVENOUS | Status: AC | PRN
  Administered 2013-12-05: 250 [IU]
  Filled 2013-12-05: qty 5

## 2013-12-05 MED ORDER — SODIUM CHLORIDE 0.9 % IJ SOLN
10.0000 mL | INTRAMUSCULAR | Status: DC | PRN
  Administered 2013-12-05: 10 mL
  Filled 2013-12-05: qty 10

## 2013-12-05 MED ORDER — SODIUM CHLORIDE 0.9 % IV SOLN
Freq: Once | INTRAVENOUS | Status: AC
  Administered 2013-12-05: 09:00:00 via INTRAVENOUS

## 2013-12-05 NOTE — Patient Instructions (Signed)
Dehydration, Adult Dehydration is when you lose more fluids from the body than you take in. Vital organs like the kidneys, brain, and heart cannot function without a proper amount of fluids and salt. Any loss of fluids from the body can cause dehydration.  CAUSES   Vomiting.  Diarrhea.  Excessive sweating.  Excessive urine output.  Fever. SYMPTOMS  Mild dehydration  Thirst.  Dry lips.  Slightly dry mouth. Moderate dehydration  Very dry mouth.  Sunken eyes.  Skin does not bounce back quickly when lightly pinched and released.  Dark urine and decreased urine production.  Decreased tear production.  Headache. Severe dehydration  Very dry mouth.  Extreme thirst.  Rapid, weak pulse (more than 100 beats per minute at rest).  Cold hands and feet.  Not able to sweat in spite of heat and temperature.  Rapid breathing.  Blue lips.  Confusion and lethargy.  Difficulty being awakened.  Minimal urine production.  No tears. DIAGNOSIS  Your caregiver will diagnose dehydration based on your symptoms and your exam. Blood and urine tests will help confirm the diagnosis. The diagnostic evaluation should also identify the cause of dehydration. TREATMENT  Treatment of mild or moderate dehydration can often be done at home by increasing the amount of fluids that you drink. It is best to drink small amounts of fluid more often. Drinking too much at one time can make vomiting worse. Refer to the home care instructions below. Severe dehydration needs to be treated at the hospital where you will probably be given intravenous (IV) fluids that contain water and electrolytes. HOME CARE INSTRUCTIONS   Ask your caregiver about specific rehydration instructions.  Drink enough fluids to keep your urine clear or pale yellow.  Drink small amounts frequently if you have nausea and vomiting.  Eat as you normally do.  Avoid:  Foods or drinks high in sugar.  Carbonated  drinks.  Juice.  Extremely hot or cold fluids.  Drinks with caffeine.  Fatty, greasy foods.  Alcohol.  Tobacco.  Overeating.  Gelatin desserts.  Wash your hands well to avoid spreading bacteria and viruses.  Only take over-the-counter or prescription medicines for pain, discomfort, or fever as directed by your caregiver.  Ask your caregiver if you should continue all prescribed and over-the-counter medicines.  Keep all follow-up appointments with your caregiver. SEEK MEDICAL CARE IF:  You have abdominal pain and it increases or stays in one area (localizes).  You have a rash, stiff neck, or severe headache.  You are irritable, sleepy, or difficult to awaken.  You are weak, dizzy, or extremely thirsty. SEEK IMMEDIATE MEDICAL CARE IF:   You are unable to keep fluids down or you get worse despite treatment.  You have frequent episodes of vomiting or diarrhea.  You have blood or green matter (bile) in your vomit.  You have blood in your stool or your stool looks black and tarry.  You have not urinated in 6 to 8 hours, or you have only urinated a small amount of very dark urine.  You have a fever.  You faint. MAKE SURE YOU:   Understand these instructions.  Will watch your condition.  Will get help right away if you are not doing well or get worse. Document Released: 01/24/2005 Document Revised: 04/18/2011 Document Reviewed: 09/13/2010 ExitCare Patient Information 2015 [employer]. This information is not intended to replace advice given to you by your health care provider. Make sure you discuss any questions you have with your health care   provider.  

## 2013-12-06 ENCOUNTER — Ambulatory Visit (HOSPITAL_BASED_OUTPATIENT_CLINIC_OR_DEPARTMENT_OTHER): Payer: BC Managed Care – PPO

## 2013-12-06 ENCOUNTER — Ambulatory Visit
Admission: RE | Admit: 2013-12-06 | Discharge: 2013-12-06 | Disposition: A | Discharge status: Home / Self Care | Payer: BC Managed Care – PPO | Source: Ambulatory Visit | Attending: Radiation Oncology | Admitting: Radiation Oncology

## 2013-12-06 VITALS — BP 122/78 | HR 83 | Temp 98.5°F

## 2013-12-06 DIAGNOSIS — Z510 Encounter for antineoplastic radiation therapy: Secondary | ICD-10-CM | POA: Diagnosis not present

## 2013-12-06 DIAGNOSIS — Z5111 Encounter for antineoplastic chemotherapy: Secondary | ICD-10-CM

## 2013-12-06 DIAGNOSIS — C01 Malignant neoplasm of base of tongue: Secondary | ICD-10-CM

## 2013-12-06 MED ORDER — PALONOSETRON HCL INJECTION 0.25 MG/5ML
0.2500 mg | Freq: Once | INTRAVENOUS | Status: AC
  Administered 2013-12-06: 0.25 mg via INTRAVENOUS

## 2013-12-06 MED ORDER — SODIUM CHLORIDE 0.9 % IV SOLN
50.0000 mg/m2 | Freq: Once | INTRAVENOUS | Status: AC
  Administered 2013-12-06: 106 mg via INTRAVENOUS
  Filled 2013-12-06: qty 106

## 2013-12-06 MED ORDER — DEXAMETHASONE SODIUM PHOSPHATE 20 MG/5ML IJ SOLN
INTRAMUSCULAR | Status: AC
  Filled 2013-12-06: qty 5

## 2013-12-06 MED ORDER — SODIUM CHLORIDE 0.9 % IJ SOLN
10.0000 mL | INTRAMUSCULAR | Status: DC | PRN
  Administered 2013-12-06: 10 mL
  Filled 2013-12-06: qty 10

## 2013-12-06 MED ORDER — HEPARIN SOD (PORK) LOCK FLUSH 100 UNIT/ML IV SOLN
250.0000 [IU] | Freq: Once | INTRAVENOUS | Status: AC | PRN
  Administered 2013-12-06: 250 [IU]
  Filled 2013-12-06: qty 5

## 2013-12-06 MED ORDER — DEXAMETHASONE SODIUM PHOSPHATE 20 MG/5ML IJ SOLN
12.0000 mg | Freq: Once | INTRAMUSCULAR | Status: AC
  Administered 2013-12-06: 12 mg via INTRAVENOUS

## 2013-12-06 MED ORDER — SODIUM CHLORIDE 0.9 % IV SOLN
150.0000 mg | Freq: Once | INTRAVENOUS | Status: AC
  Administered 2013-12-06: 150 mg via INTRAVENOUS
  Filled 2013-12-06: qty 5

## 2013-12-06 MED ORDER — PALONOSETRON HCL INJECTION 0.25 MG/5ML
INTRAVENOUS | Status: AC
  Filled 2013-12-06: qty 5

## 2013-12-06 MED ORDER — POTASSIUM CHLORIDE 2 MEQ/ML IV SOLN
Freq: Once | INTRAVENOUS | Status: AC
  Administered 2013-12-06: 10:00:00 via INTRAVENOUS
  Filled 2013-12-06: qty 10

## 2013-12-06 NOTE — Patient Instructions (Signed)
[facility] Discharge Instructions for Patients Receiving Chemotherapy  Today you received the following chemotherapy agents cisplatin.    To help prevent nausea and vomiting after your treatment, we encourage you to take your nausea medication as directed.     If you develop nausea and vomiting that is not controlled by your nausea medication, call the clinic.   BELOW ARE SYMPTOMS THAT SHOULD BE REPORTED IMMEDIATELY:  *FEVER GREATER THAN 100.5 F  *CHILLS WITH OR WITHOUT FEVER  NAUSEA AND VOMITING THAT IS NOT CONTROLLED WITH YOUR NAUSEA MEDICATION  *UNUSUAL SHORTNESS OF BREATH  *UNUSUAL BRUISING OR BLEEDING  TENDERNESS IN MOUTH AND THROAT WITH OR WITHOUT PRESENCE OF ULCERS  *URINARY PROBLEMS  *BOWEL PROBLEMS  UNUSUAL RASH Items with * indicate a potential emergency and should be followed up as soon as possible.  Feel free to call the clinic you have any questions or concerns. The clinic phone number is [phone].  

## 2013-12-09 ENCOUNTER — Ambulatory Visit: Payer: BC Managed Care – PPO

## 2013-12-09 ENCOUNTER — Ambulatory Visit (HOSPITAL_BASED_OUTPATIENT_CLINIC_OR_DEPARTMENT_OTHER): Payer: BC Managed Care – PPO

## 2013-12-09 ENCOUNTER — Telehealth: Payer: Self-pay | Admitting: Hematology and Oncology

## 2013-12-09 ENCOUNTER — Telehealth: Payer: Self-pay | Admitting: *Deleted

## 2013-12-09 ENCOUNTER — Encounter: Payer: Self-pay | Admitting: *Deleted

## 2013-12-09 ENCOUNTER — Other Ambulatory Visit: Payer: BC Managed Care – PPO

## 2013-12-09 ENCOUNTER — Encounter: Payer: Self-pay | Admitting: Hematology and Oncology

## 2013-12-09 ENCOUNTER — Ambulatory Visit: Payer: Self-pay | Admitting: Hematology and Oncology

## 2013-12-09 ENCOUNTER — Ambulatory Visit (HOSPITAL_BASED_OUTPATIENT_CLINIC_OR_DEPARTMENT_OTHER): Payer: BC Managed Care – PPO | Admitting: Hematology and Oncology

## 2013-12-09 ENCOUNTER — Inpatient Hospital Stay
Admission: RE | Admit: 2013-12-09 | Discharge: 2013-12-09 | Disposition: A | Discharge status: Home / Self Care | Payer: Self-pay | Source: Ambulatory Visit | Attending: Radiation Oncology | Admitting: Radiation Oncology

## 2013-12-09 ENCOUNTER — Ambulatory Visit
Admission: RE | Admit: 2013-12-09 | Discharge: 2013-12-09 | Disposition: A | Discharge status: Home / Self Care | Payer: BC Managed Care – PPO | Source: Ambulatory Visit | Attending: Radiation Oncology | Admitting: Radiation Oncology

## 2013-12-09 ENCOUNTER — Encounter: Payer: Self-pay | Admitting: Radiation Oncology

## 2013-12-09 VITALS — BP 114/74 | HR 87 | Temp 98.3°F | Resp 20 | Wt 201.4 lb

## 2013-12-09 VITALS — BP 114/74 | HR 87 | Temp 98.3°F | Resp 18 | Ht 65.0 in | Wt 200.8 lb

## 2013-12-09 DIAGNOSIS — D630 Anemia in neoplastic disease: Secondary | ICD-10-CM

## 2013-12-09 DIAGNOSIS — E46 Unspecified protein-calorie malnutrition: Secondary | ICD-10-CM

## 2013-12-09 DIAGNOSIS — C01 Malignant neoplasm of base of tongue: Secondary | ICD-10-CM

## 2013-12-09 DIAGNOSIS — R7989 Other specified abnormal findings of blood chemistry: Secondary | ICD-10-CM

## 2013-12-09 DIAGNOSIS — R110 Nausea: Secondary | ICD-10-CM

## 2013-12-09 DIAGNOSIS — Z95828 Presence of other vascular implants and grafts: Secondary | ICD-10-CM

## 2013-12-09 DIAGNOSIS — R1310 Dysphagia, unspecified: Secondary | ICD-10-CM

## 2013-12-09 DIAGNOSIS — Z931 Gastrostomy status: Secondary | ICD-10-CM

## 2013-12-09 DIAGNOSIS — R070 Pain in throat: Secondary | ICD-10-CM

## 2013-12-09 DIAGNOSIS — Z510 Encounter for antineoplastic radiation therapy: Secondary | ICD-10-CM | POA: Diagnosis not present

## 2013-12-09 DIAGNOSIS — Z452 Encounter for adjustment and management of vascular access device: Secondary | ICD-10-CM

## 2013-12-09 LAB — COMPREHENSIVE METABOLIC PANEL (CC13)
ALT: 16 U/L (ref 0–55)
AST: 14 U/L (ref 5–34)
Albumin: 3.1 g/dL — ABNORMAL LOW (ref 3.5–5.0)
Alkaline Phosphatase: 70 U/L (ref 40–150)
Anion Gap: 8 mEq/L (ref 3–11)
BUN: 24.9 mg/dL (ref 7.0–26.0)
CO2: 30 mEq/L — ABNORMAL HIGH (ref 22–29)
Calcium: 8.9 mg/dL (ref 8.4–10.4)
Chloride: 96 mEq/L — ABNORMAL LOW (ref 98–109)
Creatinine: 1.1 mg/dL (ref 0.7–1.3)
Glucose: 114 mg/dl (ref 70–140)
Potassium: 3.9 mEq/L (ref 3.5–5.1)
Sodium: 134 mEq/L — ABNORMAL LOW (ref 136–145)
Total Bilirubin: 0.32 mg/dL (ref 0.20–1.20)
Total Protein: 6.7 g/dL (ref 6.4–8.3)

## 2013-12-09 LAB — CBC WITH DIFFERENTIAL/PLATELET
BASO%: 0.1 % (ref 0.0–2.0)
Basophils Absolute: 0 10*3/uL (ref 0.0–0.1)
EOS%: 0.3 % (ref 0.0–7.0)
Eosinophils Absolute: 0 10*3/uL (ref 0.0–0.5)
HCT: 33 % — ABNORMAL LOW (ref 38.4–49.9)
HGB: 11.2 g/dL — ABNORMAL LOW (ref 13.0–17.1)
LYMPH%: 20.1 % (ref 14.0–49.0)
MCH: 31.1 pg (ref 27.2–33.4)
MCHC: 33.9 g/dL (ref 32.0–36.0)
MCV: 91.7 fL (ref 79.3–98.0)
MONO#: 1.3 10*3/uL — ABNORMAL HIGH (ref 0.1–0.9)
MONO%: 18.4 % — ABNORMAL HIGH (ref 0.0–14.0)
NEUT#: 4.3 10*3/uL (ref 1.5–6.5)
NEUT%: 61.1 % (ref 39.0–75.0)
Platelets: 428 10*3/uL — ABNORMAL HIGH (ref 140–400)
RBC: 3.6 10*6/uL — ABNORMAL LOW (ref 4.20–5.82)
RDW: 14.2 % (ref 11.0–14.6)
WBC: 7 10*3/uL (ref 4.0–10.3)
lymph#: 1.4 10*3/uL (ref 0.9–3.3)
nRBC: 0 % (ref 0–0)

## 2013-12-09 LAB — MAGNESIUM (CC13): Magnesium: 2.1 mg/dl (ref 1.5–2.5)

## 2013-12-09 MED ORDER — OXYCODONE HCL 15 MG PO TABS: 15.0000 mg | ORAL_TABLET | ORAL | Status: DC | PRN

## 2013-12-09 MED ORDER — PROMETHAZINE HCL 25 MG/ML IJ SOLN
25.0000 mg | Freq: Every day | INTRAMUSCULAR | Status: DC | PRN
  Filled 2013-12-09: qty 1

## 2013-12-09 MED ORDER — SODIUM CHLORIDE 0.9 % IJ SOLN
10.0000 mL | INTRAMUSCULAR | Status: DC | PRN
  Administered 2013-12-09: 10 mL via INTRAVENOUS
  Filled 2013-12-09: qty 10

## 2013-12-09 MED ORDER — HEPARIN SOD (PORK) LOCK FLUSH 100 UNIT/ML IV SOLN
250.0000 [IU] | Freq: Once | INTRAVENOUS | Status: AC | PRN
  Administered 2013-12-09: 250 [IU]
  Filled 2013-12-09: qty 5

## 2013-12-09 MED ORDER — SODIUM CHLORIDE 0.9 % IV SOLN
Freq: Once | INTRAVENOUS | Status: AC
  Administered 2013-12-09: 14:00:00 via INTRAVENOUS

## 2013-12-09 MED ORDER — SODIUM CHLORIDE 0.9 % IJ SOLN
10.0000 mL | INTRAMUSCULAR | Status: DC | PRN
  Administered 2013-12-09: 10 mL
  Filled 2013-12-09: qty 10

## 2013-12-09 MED ORDER — HEPARIN SOD (PORK) LOCK FLUSH 100 UNIT/ML IV SOLN
500.0000 [IU] | Freq: Once | INTRAVENOUS | Status: AC
  Administered 2013-12-09: 500 [IU] via INTRAVENOUS
  Filled 2013-12-09: qty 5

## 2013-12-09 MED ORDER — ONDANSETRON 8 MG/50ML IVPB (CHCC): 8.0000 mg | Freq: Every day | INTRAVENOUS | Status: DC | PRN

## 2013-12-09 MED ORDER — HEPARIN SOD (PORK) LOCK FLUSH 100 UNIT/ML IV SOLN
500.0000 [IU] | Freq: Once | INTRAVENOUS | Status: DC | PRN
  Filled 2013-12-09: qty 5

## 2013-12-09 MED ORDER — FENTANYL 50 MCG/HR TD PT72: 50.0000 ug | MEDICATED_PATCH | TRANSDERMAL | Status: DC

## 2013-12-09 NOTE — Assessment & Plan Note (Signed)
This is getting worse. He has been seen by a dietitian who recommended increase tube feeds as tolerated.

## 2013-12-09 NOTE — Assessment & Plan Note (Signed)
Recommend PICC line care with flushes.

## 2013-12-09 NOTE — Assessment & Plan Note (Signed)
The feeding tube site looks okay with no signs of infection.

## 2013-12-09 NOTE — Assessment & Plan Note (Signed)
His throat pain is getting worse. I will increase fentanyl patch to 50 mcg every 72 hours and increased breakthrough oxycodone to 15 mg as needed.

## 2013-12-09 NOTE — Assessment & Plan Note (Signed)
I would recommend we continue on aggressive supportive care. He has completed his chemotherapy. He will continue on radiation therapy. I will see him on a weekly basis for supportive care.

## 2013-12-09 NOTE — Progress Notes (Signed)
Weekly Management Note:  Site:base of tongue/tonsil/neck Current Dose:  5000  cGy Projected Dose: 7000  cGy  Narrative: The patient is seen today for routine under treatment assessment. CBCT/MVCT images/port films were reviewed. The chart was reviewed.   His pain is reasonably well-controlled with an increase in his fentanyl patch to 50 g with oxycodone increased to 15 mg when necessary. His weight remains stable through PEG tube feedings. He completed his chemotherapy this past Friday. He had nausea over the weekend which is improved with Reglan and Zofran.  Physical Examination:  Filed Vitals:   12/09/13 1528  BP: 114/74  Pulse: 87  Temp: 98.3 F (36.8 C)  Resp: 20  .  Weight: 201 lb 6.4 oz (91.354 kg). There is residual fullness along the right neck. On inspection of the oral cavity/oropharynx there is a confluent mucositis along the oropharynx. He has a brisk gag reflex and examination is suboptimal.  Laboratory data: Lab Results  Component Value Date   WBC 7.0 12/09/2013   HGB 11.2* 12/09/2013   HCT 33.0* 12/09/2013   MCV 91.7 12/09/2013   PLT 428* 12/09/2013    Impression: Tolerating radiation therapy well.  Plan: Continue radiation therapy as planned.

## 2013-12-09 NOTE — Telephone Encounter (Signed)
Sent mess to sch fluids. Will give new sch.

## 2013-12-09 NOTE — Patient Instructions (Signed)
PICC Home Guide A peripherally inserted central catheter (PICC) is a long, thin, flexible tube that is inserted into a vein in the upper arm. It is a form of intravenous (IV) access. It is considered to be a "central" line because the tip of the PICC ends in a large vein in your chest. This large vein is called the superior vena cava (SVC). The PICC tip ends in the SVC because there is a lot of blood flow in the SVC. This allows medicines and IV fluids to be quickly distributed throughout the body. The PICC is inserted using a sterile technique by a specially trained nurse or physician. After the PICC is inserted, a chest X-ray exam is done to be sure it is in the correct place.  A PICC may be placed for different reasons, such as:  To give medicines and liquid nutrition that can only be given through a central line. Examples are:  Certain antibiotic treatments.  Chemotherapy.  Total parenteral nutrition (TPN).  To take frequent blood samples.  To give IV fluids and blood products.  If there is difficulty placing a peripheral intravenous (PIV) catheter. If taken care of properly, a PICC can remain in place for several months. A PICC can also allow a person to go home from the hospital early. Medicine and PICC care can be managed at home by a family member or home health care team. WHAT PROBLEMS CAN HAPPEN WHEN I HAVE A PICC? Problems with a PICC can occasionally occur. These may include the following:  A blood clot (thrombus) forming in or at the tip of the PICC. This can cause the PICC to become clogged. A clot-dissolving medicine called tissue plasminogen activator (tPA) can be given through the PICC to help break up the clot.  Inflammation of the vein (phlebitis) in which the PICC is placed. Signs of inflammation may include redness, pain at the insertion site, red streaks, or being able to feel a "cord" in the vein where the PICC is located.  Infection in the PICC or at the insertion  site. Signs of infection may include fever, chills, redness, swelling, or pus drainage from the PICC insertion site.  PICC movement (malposition). The PICC tip may move from its original position due to excessive physical activity, forceful coughing, sneezing, or vomiting.  A break or cut in the PICC. It is important to not use scissors near the PICC.  Nerve or tendon irritation or injury during PICC insertion. WHAT SHOULD I KEEP IN MIND ABOUT ACTIVITIES WHEN I HAVE A PICC?  You may bend your arm and move it freely. If your PICC is near or at the bend of your elbow, avoid activity with repeated motion at the elbow.  Rest at home for the remainder of the day following PICC line insertion.  Avoid lifting heavy objects as instructed by your health care provider.  Avoid using a crutch with the arm on the same side as your PICC. You may need to use a walker. WHAT SHOULD I KNOW ABOUT MY PICC DRESSING?  Keep your PICC bandage (dressing) clean and dry to prevent infection.  Ask your health care provider when you may shower. Ask your health care provider to teach you how to wrap the PICC when you do take a shower.  Change the PICC dressing as instructed by your health care provider.  Change your PICC dressing if it becomes loose or wet. WHAT SHOULD I KNOW ABOUT PICC CARE?  Check the PICC insertion site   daily for leakage, redness, swelling, or pain.  Do not take a bath, swim, or use hot tubs when you have a PICC. Cover PICC line with clear plastic wrap and tape to keep it dry while showering.  Flush the PICC as directed by your health care provider. Let your health care provider know right away if the PICC is difficult to flush or does not flush. Do not use force to flush the PICC.  Do not use a syringe that is less than 10 mL to flush the PICC.  Never pull or tug on the PICC.  Avoid blood pressure checks on the arm with the PICC.  Keep your PICC identification card with you at all  times.  Do not take the PICC out yourself. Only a trained clinical professional should remove the PICC. SEEK IMMEDIATE MEDICAL CARE IF:  Your PICC is accidentally pulled all the way out. If this happens, cover the insertion site with a bandage or gauze dressing. Do not throw the PICC away. Your health care provider will need to inspect it.  Your PICC was tugged or pulled and has partially come out. Do not  push the PICC back in.  There is any type of drainage, redness, or swelling where the PICC enters the skin.  You cannot flush the PICC, it is difficult to flush, or the PICC leaks around the insertion site when it is flushed.  You hear a "flushing" sound when the PICC is flushed.  You have pain, discomfort, or numbness in your arm, shoulder, or jaw on the same side as the PICC.  You feel your heart "racing" or skipping beats.  You notice a hole or tear in the PICC.  You develop chills or a fever. MAKE SURE YOU:   Understand these instructions.  Will watch your condition.  Will get help right away if you are not doing well or get worse. Document Released: 07/31/2002 Document Revised: 06/10/2013 Document Reviewed: 10/01/2012 [employer] Patient Information 2015 [employer]. This information is not intended to replace advice given to you by your health care provider. Make sure you discuss any questions you have with your health care provider.  

## 2013-12-09 NOTE — Patient Instructions (Signed)
Dehydration, Adult Dehydration is when you lose more fluids from the body than you take in. Vital organs like the kidneys, brain, and heart cannot function without a proper amount of fluids and salt. Any loss of fluids from the body can cause dehydration.  CAUSES   Vomiting.  Diarrhea.  Excessive sweating.  Excessive urine output.  Fever. SYMPTOMS  Mild dehydration  Thirst.  Dry lips.  Slightly dry mouth. Moderate dehydration  Very dry mouth.  Sunken eyes.  Skin does not bounce back quickly when lightly pinched and released.  Dark urine and decreased urine production.  Decreased tear production.  Headache. Severe dehydration  Very dry mouth.  Extreme thirst.  Rapid, weak pulse (more than 100 beats per minute at rest).  Cold hands and feet.  Not able to sweat in spite of heat and temperature.  Rapid breathing.  Blue lips.  Confusion and lethargy.  Difficulty being awakened.  Minimal urine production.  No tears. DIAGNOSIS  Your caregiver will diagnose dehydration based on your symptoms and your exam. Blood and urine tests will help confirm the diagnosis. The diagnostic evaluation should also identify the cause of dehydration. TREATMENT  Treatment of mild or moderate dehydration can often be done at home by increasing the amount of fluids that you drink. It is best to drink small amounts of fluid more often. Drinking too much at one time can make vomiting worse. Refer to the home care instructions below. Severe dehydration needs to be treated at the hospital where you will probably be given intravenous (IV) fluids that contain water and electrolytes. HOME CARE INSTRUCTIONS   Ask your caregiver about specific rehydration instructions.  Drink enough fluids to keep your urine clear or pale yellow.  Drink small amounts frequently if you have nausea and vomiting.  Eat as you normally do.  Avoid:  Foods or drinks high in sugar.  Carbonated  drinks.  Juice.  Extremely hot or cold fluids.  Drinks with caffeine.  Fatty, greasy foods.  Alcohol.  Tobacco.  Overeating.  Gelatin desserts.  Wash your hands well to avoid spreading bacteria and viruses.  Only take over-the-counter or prescription medicines for pain, discomfort, or fever as directed by your caregiver.  Ask your caregiver if you should continue all prescribed and over-the-counter medicines.  Keep all follow-up appointments with your caregiver. SEEK MEDICAL CARE IF:  You have abdominal pain and it increases or stays in one area (localizes).  You have a rash, stiff neck, or severe headache.  You are irritable, sleepy, or difficult to awaken.  You are weak, dizzy, or extremely thirsty. SEEK IMMEDIATE MEDICAL CARE IF:   You are unable to keep fluids down or you get worse despite treatment.  You have frequent episodes of vomiting or diarrhea.  You have blood or green matter (bile) in your vomit.  You have blood in your stool or your stool looks black and tarry.  You have not urinated in 6 to 8 hours, or you have only urinated a small amount of very dark urine.  You have a fever.  You faint. MAKE SURE YOU:   Understand these instructions.  Will watch your condition.  Will get help right away if you are not doing well or get worse. Document Released: 01/24/2005 Document Revised: 04/18/2011 Document Reviewed: 09/13/2010 ExitCare Patient Information 2015 [employer]. This information is not intended to replace advice given to you by your health care provider. Make sure you discuss any questions you have with your health care   provider.  

## 2013-12-09 NOTE — Progress Notes (Signed)
Patient had chemo last Fri, receiving IVF today. He had nausea over the weekend, wears Scopolamine patch, has Zofran, Compazine prn. He is wearing Fentanyl patch which is increased to 50 mcg today per Dr Alvy Bimler. His Oxycodone was increased to 15 mg today. He is taking in nutrition per peg tube; his wife states she "tries to only give him the amount that will stay down". Patient applying Radiaplex to neck treatment area for hyperpigmentation, dry desquamation.

## 2013-12-09 NOTE — Assessment & Plan Note (Signed)
This is likely due to recent treatment. The patient denies recent history of bleeding such as epistaxis, hematuria or hematochezia. He is asymptomatic from the anemia. I will observe for now.    

## 2013-12-09 NOTE — Progress Notes (Signed)
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 There are right level 2 and level 3 adenopathy as described and right peritracheal lymph node within the superior mediastinum   09/12/2013 Surgery Laryngoscopy showed the oropharynx revealed a firm friable mass involving the lateral base of tongue and inferior tonsillar fossa.    09/13/2013 Pathology Results Accession: [id number] biopsy confirmed squamous cell carcinoma, HPV positive   09/25/2013 Imaging PET scan showed right base of tongue mass exhibits intense FDG uptake compatible with primary head neck carcinoma with associated hypermetabolic right level to adenopathy   10/03/2013 Procedure He had dental extraction   10/21/2013 - 12/06/2013 Chemotherapy He start on high dose cisplatin. Dose number 3 reduced 50% due to side-effects   11/04/2013 -  Radiation Therapy He received radiation therapy   11/19/2013 Procedure He had placement of right PICC line and removal of port    INTERVAL HISTORY: Please see below for problem oriented charting. He is doing well. He is able to tolerate 5-1/2 cans of nutritional supplement this past weekend. His throat pain is getting a bit worse. He is using oxycodone every 4 hours. He  denies any constipation. He has some mild nausea but no vomiting.  REVIEW OF SYSTEMS:   Constitutional: Denies fevers, chills or abnormal weight loss Eyes: Denies blurriness of vision Respiratory: Denies cough, dyspnea or wheezes Cardiovascular: Denies palpitation, chest discomfort or lower extremity swelling Lymphatics: Denies new lymphadenopathy or easy bruising Neurological:Denies numbness, tingling or new weaknesses Behavioral/Psych: Mood is stable, no new changes  All other systems were reviewed with the patient and are negative.  I have reviewed the past medical history, past surgical history, social history and family history with the patient and they are unchanged from previous note.  ALLERGIES:  is allergic to codeine and velosef.  MEDICATIONS:  Current Outpatient Prescriptions  Medication Sig Dispense Refill  . amoxicillin-clavulanate (AUGMENTIN) 400-57 MG/5ML suspension Take 400 mg by mouth 2 (two) times daily.    . fentaNYL (DURAGESIC - DOSED MCG/HR) 50 MCG/HR Place 1 patch (50 mcg total) onto the skin every 3 (three) days. 5 patch 0  . hyaluronate sodium (RADIAPLEXRX) GEL Apply 1 application topically 2 (two) times daily.    Marland Kitchen HYDROcodone-acetaminophen (NORCO) 7.5-325 MG per tablet Take 1-2 tablets by mouth every 4 (four) hours as needed for moderate pain. 50 tablet 0  . lidocaine-prilocaine (EMLA) cream Apply 1 application topically as needed (per port-a-cath).    . metoCLOPramide (REGLAN) 5 MG/5ML solution Take 10 mLs (10 mg total) by mouth 4 (four) times daily -  before meals and at bedtime. 480 mL 3  . Nutritional Supplements (FEEDING SUPPLEMENT, OSMOLITE 1.5 CAL,) LIQD Begin 1 can Osmolite 1.5 QID with 60  ml free water before and after bolus feeding.  Increase to 1.5 cans QID on day 2.  Increase to goal of 1.5 cans BID and 2 cans BID day 3.  Flush with additional 240 cc water TID between feedings. Send formula please. 1659 mL 0  . ondansetron (ZOFRAN) 8 MG tablet Take 1  tablet (8 mg total) by mouth every 8 (eight) hours as needed for nausea or vomiting. 60 tablet 6  . oxyCODONE 10 MG TABS Take 1 tablet (10 mg total) by mouth every 4 (four) hours as needed for severe pain. 60 tablet 0  . prochlorperazine (COMPAZINE) 10 MG tablet Take 1 tablet (10 mg total) by mouth every 6 (six) hours as needed for nausea or vomiting. 60 tablet 3  . prochlorperazine (COMPAZINE) 10 MG tablet TAKE 1 TABLET BY MOUTH EVERY 6 HOURS AS NEEDED FOR NAUSEA OR VOMITING 60 tablet 1  . scopolamine (TRANSDERM-SCOP) 1 MG/3DAYS Place 1 patch (1.5 mg total) onto the skin every 3 (three) days. 10 patch 12  . Sodium Chloride Flush 0.9 % SOLN injection Inject 10 mLs into the vein daily. 100 Syringe 3  . sodium fluoride (FLUORISHIELD) 1.1 % GEL dental gel Place 1 application onto teeth at bedtime.    Marland Kitchen oxyCODONE (ROXICODONE) 15 MG immediate release tablet Take 1 tablet (15 mg total) by mouth every 4 (four) hours as needed for severe pain. 60 tablet 0   No current facility-administered medications for this visit.    PHYSICAL EXAMINATION: ECOG PERFORMANCE STATUS: 1 - Symptomatic but completely ambulatory  Filed Vitals:   12/09/13 1334  BP: 114/74  Pulse: 87  Temp: 98.3 F (36.8 C)  Resp: 18   Filed Weights   12/09/13 1334  Weight: 200 lb 12.8 oz (91.082 kg)    GENERAL:alert, no distress and comfortable SKIN:Noted radiation-induced dermatitis. No ulceration of the skin. PICC line site looks okay EYES: normal, Conjunctiva are pink and non-injected, sclera clear OROPHARYNX:no exudate, no erythema and lips, buccal mucosa, and tongue normal . Noted mucositis but no thrush. NECK: supple, thyroid normal size, non-tender, without nodularity LYMPH:  no palpable lymphadenopathy in the cervical, axillary or inguinal LUNGS: clear to auscultation and percussion with normal breathing effort HEART: regular rate & rhythm and no murmurs and no lower extremity edema ABDOMEN:abdomen soft, non-tender and  normal bowel sounds. Feeding tube site looks okay Musculoskeletal:no cyanosis of digits and no clubbing  NEURO: alert & oriented x 3 with fluent speech, no focal motor/sensory deficits  LABORATORY DATA:  I have reviewed the data as listed    Component Value Date/Time   NA 135* 12/02/2013 0811   NA 134* 11/27/2013 0813   K 3.6 12/02/2013 0811   K 4.1 11/27/2013 0813   CL 95* 11/27/2013 0813   CO2 29 12/02/2013 0811   CO2 28 11/27/2013 0813   GLUCOSE 198* 12/02/2013 0811   GLUCOSE 105* 11/27/2013 0813   BUN 17.7 12/02/2013 0811   BUN 19 11/27/2013 0813   CREATININE 1.1 12/02/2013 0811   CREATININE 1.16 11/27/2013 0813   CALCIUM 9.1 12/02/2013 0811   CALCIUM 8.9 11/27/2013 0813   PROT 6.4 12/02/2013 0811   PROT 6.9 11/27/2013 0813   ALBUMIN 2.6* 12/02/2013 0811   ALBUMIN 3.2* 11/27/2013 0813   AST 14 12/02/2013 0811   AST 13 11/27/2013 0813   ALT 17 12/02/2013 0811   ALT 10 11/27/2013 0813   ALKPHOS 59 12/02/2013 0811   ALKPHOS 61 11/27/2013 0813   BILITOT 0.25 12/02/2013 1324  BILITOT 0.6 11/27/2013 0813   GFRNONAA 40* 11/19/2013 1105   GFRAA 47* 11/19/2013 1105    No results found for: SPEP, UPEP  Lab Results  Component Value Date   WBC 7.0 12/09/2013   NEUTROABS 4.3 12/09/2013   HGB 11.2* 12/09/2013   HCT 33.0* 12/09/2013   MCV 91.7 12/09/2013   PLT 428* 12/09/2013      Chemistry      Component Value Date/Time   NA 135* 12/02/2013 0811   NA 134* 11/27/2013 0813   K 3.6 12/02/2013 0811   K 4.1 11/27/2013 0813   CL 95* 11/27/2013 0813   CO2 29 12/02/2013 0811   CO2 28 11/27/2013 0813   BUN 17.7 12/02/2013 0811   BUN 19 11/27/2013 0813   CREATININE 1.1 12/02/2013 0811   CREATININE 1.16 11/27/2013 0813      Component Value Date/Time   CALCIUM 9.1 12/02/2013 0811   CALCIUM 8.9 11/27/2013 0813   ALKPHOS 59 12/02/2013 0811   ALKPHOS 61 11/27/2013 0813   AST 14 12/02/2013 0811   AST 13 11/27/2013 0813   ALT 17 12/02/2013 0811   ALT 10 11/27/2013 0813    BILITOT 0.25 12/02/2013 0811   BILITOT 0.6 11/27/2013 0813     ASSESSMENT & PLAN:  Squamous cell carcinoma of the right base of tongue I would recommend we continue on aggressive supportive care. He has completed his chemotherapy. He will continue on radiation therapy. I will see him on a weekly basis for supportive care.   Nausea without vomiting He will continue on Reglan before meals and scopolamine patch. I felt that that is a component of mucus gagging. He has benefited from IV fluids and I recommend we continue the same.    Protein calorie malnutrition This is getting worse. He has been seen by a dietitian who recommended increase tube feeds as tolerated.    S/P gastrostomy The feeding tube site looks okay with no signs of infection.    S/P PICC central line placement Recommend PICC line care with flushes.  Throat pain in adult His throat pain is getting worse. I will increase fentanyl patch to 50 mcg every 72 hours and increased breakthrough oxycodone to 15 mg as needed.  Anemia in neoplastic disease This is likely due to recent treatment. The patient denies recent history of bleeding such as epistaxis, hematuria or hematochezia. He is asymptomatic from the anemia. I will observe for now.     No orders of the defined types were placed in this encounter.   All questions were answered. The patient knows to call the clinic with any problems, questions or concerns. No barriers to learning was detected. I spent 30 minutes counseling the patient face to face. The total time spent in the appointment was 40 minutes and more than 50% was on counseling and review of test results     [facility], [place], MD 12/09/2013 1:54 PM

## 2013-12-09 NOTE — Telephone Encounter (Signed)
Per staff message and POF I have scheduled appts. Advised scheduler of appts. JMW  

## 2013-12-09 NOTE — Assessment & Plan Note (Signed)
He will continue on Reglan before meals and scopolamine patch. I felt that that is a component of mucus gagging. He has benefited from IV fluids and I recommend we continue the same.

## 2013-12-10 ENCOUNTER — Ambulatory Visit
Admission: RE | Admit: 2013-12-10 | Discharge: 2013-12-10 | Disposition: A | Discharge status: Home / Self Care | Payer: BC Managed Care – PPO | Source: Ambulatory Visit | Attending: Radiation Oncology | Admitting: Radiation Oncology

## 2013-12-10 ENCOUNTER — Ambulatory Visit (HOSPITAL_BASED_OUTPATIENT_CLINIC_OR_DEPARTMENT_OTHER): Payer: BC Managed Care – PPO

## 2013-12-10 DIAGNOSIS — R7989 Other specified abnormal findings of blood chemistry: Secondary | ICD-10-CM

## 2013-12-10 DIAGNOSIS — C01 Malignant neoplasm of base of tongue: Secondary | ICD-10-CM

## 2013-12-10 DIAGNOSIS — R110 Nausea: Secondary | ICD-10-CM

## 2013-12-10 DIAGNOSIS — Z510 Encounter for antineoplastic radiation therapy: Secondary | ICD-10-CM | POA: Diagnosis not present

## 2013-12-10 DIAGNOSIS — R1310 Dysphagia, unspecified: Secondary | ICD-10-CM

## 2013-12-10 MED ORDER — RADIAPLEXRX EX GEL
Freq: Once | CUTANEOUS | Status: AC
  Administered 2013-12-10: 09:00:00 via TOPICAL

## 2013-12-10 MED ORDER — SODIUM CHLORIDE 0.9 % IV SOLN
Freq: Once | INTRAVENOUS | Status: AC
  Administered 2013-12-10: 13:00:00 via INTRAVENOUS

## 2013-12-10 MED ORDER — SODIUM CHLORIDE 0.9 % IJ SOLN
10.0000 mL | INTRAMUSCULAR | Status: DC | PRN
  Administered 2013-12-10: 10 mL
  Filled 2013-12-10: qty 10

## 2013-12-10 MED ORDER — HEPARIN SOD (PORK) LOCK FLUSH 100 UNIT/ML IV SOLN
250.0000 [IU] | Freq: Once | INTRAVENOUS | Status: AC | PRN
  Administered 2013-12-10: 250 [IU]
  Filled 2013-12-10: qty 5

## 2013-12-10 NOTE — Patient Instructions (Signed)
Dehydration, Adult Dehydration is when you lose more fluids from the body than you take in. Vital organs like the kidneys, brain, and heart cannot function without a proper amount of fluids and salt. Any loss of fluids from the body can cause dehydration.  CAUSES   Vomiting.  Diarrhea.  Excessive sweating.  Excessive urine output.  Fever. SYMPTOMS  Mild dehydration  Thirst.  Dry lips.  Slightly dry mouth. Moderate dehydration  Very dry mouth.  Sunken eyes.  Skin does not bounce back quickly when lightly pinched and released.  Dark urine and decreased urine production.  Decreased tear production.  Headache. Severe dehydration  Very dry mouth.  Extreme thirst.  Rapid, weak pulse (more than 100 beats per minute at rest).  Cold hands and feet.  Not able to sweat in spite of heat and temperature.  Rapid breathing.  Blue lips.  Confusion and lethargy.  Difficulty being awakened.  Minimal urine production.  No tears. DIAGNOSIS  Your caregiver will diagnose dehydration based on your symptoms and your exam. Blood and urine tests will help confirm the diagnosis. The diagnostic evaluation should also identify the cause of dehydration. TREATMENT  Treatment of mild or moderate dehydration can often be done at home by increasing the amount of fluids that you drink. It is best to drink small amounts of fluid more often. Drinking too much at one time can make vomiting worse. Refer to the home care instructions below. Severe dehydration needs to be treated at the hospital where you will probably be given intravenous (IV) fluids that contain water and electrolytes. HOME CARE INSTRUCTIONS   Ask your caregiver about specific rehydration instructions.  Drink enough fluids to keep your urine clear or pale yellow.  Drink small amounts frequently if you have nausea and vomiting.  Eat as you normally do.  Avoid:  Foods or drinks high in sugar.  Carbonated  drinks.  Juice.  Extremely hot or cold fluids.  Drinks with caffeine.  Fatty, greasy foods.  Alcohol.  Tobacco.  Overeating.  Gelatin desserts.  Wash your hands well to avoid spreading bacteria and viruses.  Only take over-the-counter or prescription medicines for pain, discomfort, or fever as directed by your caregiver.  Ask your caregiver if you should continue all prescribed and over-the-counter medicines.  Keep all follow-up appointments with your caregiver. SEEK MEDICAL CARE IF:  You have abdominal pain and it increases or stays in one area (localizes).  You have a rash, stiff neck, or severe headache.  You are irritable, sleepy, or difficult to awaken.  You are weak, dizzy, or extremely thirsty. SEEK IMMEDIATE MEDICAL CARE IF:   You are unable to keep fluids down or you get worse despite treatment.  You have frequent episodes of vomiting or diarrhea.  You have blood or green matter (bile) in your vomit.  You have blood in your stool or your stool looks black and tarry.  You have not urinated in 6 to 8 hours, or you have only urinated a small amount of very dark urine.  You have a fever.  You faint. MAKE SURE YOU:   Understand these instructions.  Will watch your condition.  Will get help right away if you are not doing well or get worse. Document Released: 01/24/2005 Document Revised: 04/18/2011 Document Reviewed: 09/13/2010 ExitCare Patient Information 2015 [employer]. This information is not intended to replace advice given to you by your health care provider. Make sure you discuss any questions you have with your health care   provider.  

## 2013-12-11 ENCOUNTER — Ambulatory Visit
Admission: RE | Admit: 2013-12-11 | Discharge: 2013-12-11 | Disposition: A | Discharge status: Home / Self Care | Payer: BC Managed Care – PPO | Source: Ambulatory Visit | Attending: Radiation Oncology | Admitting: Radiation Oncology

## 2013-12-11 ENCOUNTER — Ambulatory Visit (HOSPITAL_BASED_OUTPATIENT_CLINIC_OR_DEPARTMENT_OTHER): Payer: BC Managed Care – PPO

## 2013-12-11 DIAGNOSIS — C01 Malignant neoplasm of base of tongue: Secondary | ICD-10-CM

## 2013-12-11 DIAGNOSIS — R1310 Dysphagia, unspecified: Secondary | ICD-10-CM

## 2013-12-11 DIAGNOSIS — Z510 Encounter for antineoplastic radiation therapy: Secondary | ICD-10-CM | POA: Diagnosis not present

## 2013-12-11 DIAGNOSIS — R7989 Other specified abnormal findings of blood chemistry: Secondary | ICD-10-CM

## 2013-12-11 DIAGNOSIS — R110 Nausea: Secondary | ICD-10-CM

## 2013-12-11 MED ORDER — SODIUM CHLORIDE 0.9 % IJ SOLN
10.0000 mL | INTRAMUSCULAR | Status: DC | PRN
  Administered 2013-12-11: 10 mL
  Filled 2013-12-11: qty 10

## 2013-12-11 MED ORDER — ONDANSETRON 8 MG/NS 50 ML IVPB
INTRAVENOUS | Status: AC
  Filled 2013-12-11: qty 8

## 2013-12-11 MED ORDER — SODIUM CHLORIDE 0.9 % IV SOLN
Freq: Once | INTRAVENOUS | Status: AC
  Administered 2013-12-11: 09:00:00 via INTRAVENOUS

## 2013-12-11 MED ORDER — HEPARIN SOD (PORK) LOCK FLUSH 100 UNIT/ML IV SOLN
250.0000 [IU] | Freq: Once | INTRAVENOUS | Status: AC | PRN
  Administered 2013-12-11: 250 [IU]
  Filled 2013-12-11: qty 5

## 2013-12-11 MED ORDER — ONDANSETRON 8 MG/50ML IVPB (CHCC)
8.0000 mg | Freq: Every day | INTRAVENOUS | Status: DC | PRN
  Administered 2013-12-11: 8 mg via INTRAVENOUS

## 2013-12-11 NOTE — Patient Instructions (Signed)
Dehydration, Adult Dehydration is when you lose more fluids from the body than you take in. Vital organs like the kidneys, brain, and heart cannot function without a proper amount of fluids and salt. Any loss of fluids from the body can cause dehydration.  CAUSES   Vomiting.  Diarrhea.  Excessive sweating.  Excessive urine output.  Fever. SYMPTOMS  Mild dehydration  Thirst.  Dry lips.  Slightly dry mouth. Moderate dehydration  Very dry mouth.  Sunken eyes.  Skin does not bounce back quickly when lightly pinched and released.  Dark urine and decreased urine production.  Decreased tear production.  Headache. Severe dehydration  Very dry mouth.  Extreme thirst.  Rapid, weak pulse (more than 100 beats per minute at rest).  Cold hands and feet.  Not able to sweat in spite of heat and temperature.  Rapid breathing.  Blue lips.  Confusion and lethargy.  Difficulty being awakened.  Minimal urine production.  No tears. DIAGNOSIS  Your caregiver will diagnose dehydration based on your symptoms and your exam. Blood and urine tests will help confirm the diagnosis. The diagnostic evaluation should also identify the cause of dehydration. TREATMENT  Treatment of mild or moderate dehydration can often be done at home by increasing the amount of fluids that you drink. It is best to drink small amounts of fluid more often. Drinking too much at one time can make vomiting worse. Refer to the home care instructions below. Severe dehydration needs to be treated at the hospital where you will probably be given intravenous (IV) fluids that contain water and electrolytes. HOME CARE INSTRUCTIONS   Ask your caregiver about specific rehydration instructions.  Drink enough fluids to keep your urine clear or pale yellow.  Drink small amounts frequently if you have nausea and vomiting.  Eat as you normally do.  Avoid:  Foods or drinks high in sugar.  Carbonated  drinks.  Juice.  Extremely hot or cold fluids.  Drinks with caffeine.  Fatty, greasy foods.  Alcohol.  Tobacco.  Overeating.  Gelatin desserts.  Wash your hands well to avoid spreading bacteria and viruses.  Only take over-the-counter or prescription medicines for pain, discomfort, or fever as directed by your caregiver.  Ask your caregiver if you should continue all prescribed and over-the-counter medicines.  Keep all follow-up appointments with your caregiver. SEEK MEDICAL CARE IF:  You have abdominal pain and it increases or stays in one area (localizes).  You have a rash, stiff neck, or severe headache.  You are irritable, sleepy, or difficult to awaken.  You are weak, dizzy, or extremely thirsty. SEEK IMMEDIATE MEDICAL CARE IF:   You are unable to keep fluids down or you get worse despite treatment.  You have frequent episodes of vomiting or diarrhea.  You have blood or green matter (bile) in your vomit.  You have blood in your stool or your stool looks black and tarry.  You have not urinated in 6 to 8 hours, or you have only urinated a small amount of very dark urine.  You have a fever.  You faint. MAKE SURE YOU:   Understand these instructions.  Will watch your condition.  Will get help right away if you are not doing well or get worse. Document Released: 01/24/2005 Document Revised: 04/18/2011 Document Reviewed: 09/13/2010 ExitCare Patient Information 2015 [employer]. This information is not intended to replace advice given to you by your health care provider. Make sure you discuss any questions you have with your health care   provider.  

## 2013-12-12 ENCOUNTER — Ambulatory Visit (HOSPITAL_BASED_OUTPATIENT_CLINIC_OR_DEPARTMENT_OTHER): Payer: BC Managed Care – PPO

## 2013-12-12 ENCOUNTER — Ambulatory Visit
Admission: RE | Admit: 2013-12-12 | Discharge: 2013-12-12 | Disposition: A | Discharge status: Home / Self Care | Payer: BC Managed Care – PPO | Source: Ambulatory Visit | Attending: Radiation Oncology | Admitting: Radiation Oncology

## 2013-12-12 DIAGNOSIS — C01 Malignant neoplasm of base of tongue: Secondary | ICD-10-CM

## 2013-12-12 DIAGNOSIS — R110 Nausea: Secondary | ICD-10-CM

## 2013-12-12 DIAGNOSIS — R1310 Dysphagia, unspecified: Secondary | ICD-10-CM

## 2013-12-12 DIAGNOSIS — R7989 Other specified abnormal findings of blood chemistry: Secondary | ICD-10-CM

## 2013-12-12 DIAGNOSIS — Z510 Encounter for antineoplastic radiation therapy: Secondary | ICD-10-CM | POA: Diagnosis not present

## 2013-12-12 MED ORDER — HEPARIN SOD (PORK) LOCK FLUSH 100 UNIT/ML IV SOLN
250.0000 [IU] | Freq: Once | INTRAVENOUS | Status: AC | PRN
  Administered 2013-12-12: 250 [IU]
  Filled 2013-12-12: qty 5

## 2013-12-12 MED ORDER — PROMETHAZINE HCL 25 MG/ML IJ SOLN
25.0000 mg | Freq: Every day | INTRAMUSCULAR | Status: DC | PRN
  Filled 2013-12-12: qty 1

## 2013-12-12 MED ORDER — SODIUM CHLORIDE 0.9 % IV SOLN
Freq: Once | INTRAVENOUS | Status: AC
  Administered 2013-12-12: 08:00:00 via INTRAVENOUS

## 2013-12-12 MED ORDER — SODIUM CHLORIDE 0.9 % IJ SOLN
10.0000 mL | INTRAMUSCULAR | Status: DC | PRN
  Administered 2013-12-12: 10 mL
  Filled 2013-12-12: qty 10

## 2013-12-12 MED ORDER — ONDANSETRON 8 MG/50ML IVPB (CHCC): 8.0000 mg | Freq: Every day | INTRAVENOUS | Status: DC | PRN

## 2013-12-12 NOTE — Progress Notes (Signed)
Pt denies need for nausea medication.  0900: Pt escorted to radiation by rad tech, ambulatory and in no distress.  0930: Pt returned from radiation

## 2013-12-12 NOTE — Patient Instructions (Signed)
Dehydration, Adult Dehydration is when you lose more fluids from the body than you take in. Vital organs like the kidneys, brain, and heart cannot function without a proper amount of fluids and salt. Any loss of fluids from the body can cause dehydration.  CAUSES   Vomiting.  Diarrhea.  Excessive sweating.  Excessive urine output.  Fever. SYMPTOMS  Mild dehydration  Thirst.  Dry lips.  Slightly dry mouth. Moderate dehydration  Very dry mouth.  Sunken eyes.  Skin does not bounce back quickly when lightly pinched and released.  Dark urine and decreased urine production.  Decreased tear production.  Headache. Severe dehydration  Very dry mouth.  Extreme thirst.  Rapid, weak pulse (more than 100 beats per minute at rest).  Cold hands and feet.  Not able to sweat in spite of heat and temperature.  Rapid breathing.  Blue lips.  Confusion and lethargy.  Difficulty being awakened.  Minimal urine production.  No tears. DIAGNOSIS  Your caregiver will diagnose dehydration based on your symptoms and your exam. Blood and urine tests will help confirm the diagnosis. The diagnostic evaluation should also identify the cause of dehydration. TREATMENT  Treatment of mild or moderate dehydration can often be done at home by increasing the amount of fluids that you drink. It is best to drink small amounts of fluid more often. Drinking too much at one time can make vomiting worse. Refer to the home care instructions below. Severe dehydration needs to be treated at the hospital where you will probably be given intravenous (IV) fluids that contain water and electrolytes. HOME CARE INSTRUCTIONS   Ask your caregiver about specific rehydration instructions.  Drink enough fluids to keep your urine clear or pale yellow.  Drink small amounts frequently if you have nausea and vomiting.  Eat as you normally do.  Avoid:  Foods or drinks high in sugar.  Carbonated  drinks.  Juice.  Extremely hot or cold fluids.  Drinks with caffeine.  Fatty, greasy foods.  Alcohol.  Tobacco.  Overeating.  Gelatin desserts.  Wash your hands well to avoid spreading bacteria and viruses.  Only take over-the-counter or prescription medicines for pain, discomfort, or fever as directed by your caregiver.  Ask your caregiver if you should continue all prescribed and over-the-counter medicines.  Keep all follow-up appointments with your caregiver. SEEK MEDICAL CARE IF:  You have abdominal pain and it increases or stays in one area (localizes).  You have a rash, stiff neck, or severe headache.  You are irritable, sleepy, or difficult to awaken.  You are weak, dizzy, or extremely thirsty. SEEK IMMEDIATE MEDICAL CARE IF:   You are unable to keep fluids down or you get worse despite treatment.  You have frequent episodes of vomiting or diarrhea.  You have blood or green matter (bile) in your vomit.  You have blood in your stool or your stool looks black and tarry.  You have not urinated in 6 to 8 hours, or you have only urinated a small amount of very dark urine.  You have a fever.  You faint. MAKE SURE YOU:   Understand these instructions.  Will watch your condition.  Will get help right away if you are not doing well or get worse. Document Released: 01/24/2005 Document Revised: 04/18/2011 Document Reviewed: 09/13/2010 ExitCare Patient Information 2015 [employer]. This information is not intended to replace advice given to you by your health care provider. Make sure you discuss any questions you have with your health care   provider.  

## 2013-12-13 ENCOUNTER — Ambulatory Visit (HOSPITAL_BASED_OUTPATIENT_CLINIC_OR_DEPARTMENT_OTHER): Payer: BC Managed Care – PPO

## 2013-12-13 ENCOUNTER — Ambulatory Visit
Admission: RE | Admit: 2013-12-13 | Discharge: 2013-12-13 | Disposition: A | Discharge status: Home / Self Care | Payer: BC Managed Care – PPO | Source: Ambulatory Visit | Attending: Radiation Oncology | Admitting: Radiation Oncology

## 2013-12-13 DIAGNOSIS — R1310 Dysphagia, unspecified: Secondary | ICD-10-CM

## 2013-12-13 DIAGNOSIS — Z510 Encounter for antineoplastic radiation therapy: Secondary | ICD-10-CM | POA: Diagnosis not present

## 2013-12-13 DIAGNOSIS — R7989 Other specified abnormal findings of blood chemistry: Secondary | ICD-10-CM

## 2013-12-13 DIAGNOSIS — R110 Nausea: Secondary | ICD-10-CM

## 2013-12-13 DIAGNOSIS — C01 Malignant neoplasm of base of tongue: Secondary | ICD-10-CM

## 2013-12-13 MED ORDER — SODIUM CHLORIDE 0.9 % IV SOLN
Freq: Once | INTRAVENOUS | Status: AC
  Administered 2013-12-13: 08:00:00 via INTRAVENOUS

## 2013-12-13 MED ORDER — HEPARIN SOD (PORK) LOCK FLUSH 100 UNIT/ML IV SOLN
250.0000 [IU] | Freq: Once | INTRAVENOUS | Status: AC | PRN
  Administered 2013-12-13: 250 [IU]
  Filled 2013-12-13: qty 5

## 2013-12-13 MED ORDER — SODIUM CHLORIDE 0.9 % IJ SOLN
10.0000 mL | INTRAMUSCULAR | Status: DC | PRN
  Administered 2013-12-13: 10 mL
  Filled 2013-12-13: qty 10

## 2013-12-13 MED ORDER — SENNOSIDES 8.8 MG/5ML PO SYRP: 5.0000 mL | ORAL_SOLUTION | Freq: Two times a day (BID) | ORAL | Status: DC | PRN

## 2013-12-13 MED ORDER — HEPARIN SOD (PORK) LOCK FLUSH 100 UNIT/ML IV SOLN
500.0000 [IU] | Freq: Once | INTRAVENOUS | Status: DC | PRN
  Filled 2013-12-13: qty 5

## 2013-12-13 NOTE — Patient Instructions (Signed)
Dehydration, Adult Dehydration is when you lose more fluids from the body than you take in. Vital organs like the kidneys, brain, and heart cannot function without a proper amount of fluids and salt. Any loss of fluids from the body can cause dehydration.  CAUSES   Vomiting.  Diarrhea.  Excessive sweating.  Excessive urine output.  Fever. SYMPTOMS  Mild dehydration  Thirst.  Dry lips.  Slightly dry mouth. Moderate dehydration  Very dry mouth.  Sunken eyes.  Skin does not bounce back quickly when lightly pinched and released.  Dark urine and decreased urine production.  Decreased tear production.  Headache. Severe dehydration  Very dry mouth.  Extreme thirst.  Rapid, weak pulse (more than 100 beats per minute at rest).  Cold hands and feet.  Not able to sweat in spite of heat and temperature.  Rapid breathing.  Blue lips.  Confusion and lethargy.  Difficulty being awakened.  Minimal urine production.  No tears. DIAGNOSIS  Your caregiver will diagnose dehydration based on your symptoms and your exam. Blood and urine tests will help confirm the diagnosis. The diagnostic evaluation should also identify the cause of dehydration. TREATMENT  Treatment of mild or moderate dehydration can often be done at home by increasing the amount of fluids that you drink. It is best to drink small amounts of fluid more often. Drinking too much at one time can make vomiting worse. Refer to the home care instructions below. Severe dehydration needs to be treated at the hospital where you will probably be given intravenous (IV) fluids that contain water and electrolytes. HOME CARE INSTRUCTIONS   Ask your caregiver about specific rehydration instructions.  Drink enough fluids to keep your urine clear or pale yellow.  Drink small amounts frequently if you have nausea and vomiting.  Eat as you normally do.  Avoid:  Foods or drinks high in sugar.  Carbonated  drinks.  Juice.  Extremely hot or cold fluids.  Drinks with caffeine.  Fatty, greasy foods.  Alcohol.  Tobacco.  Overeating.  Gelatin desserts.  Wash your hands well to avoid spreading bacteria and viruses.  Only take over-the-counter or prescription medicines for pain, discomfort, or fever as directed by your caregiver.  Ask your caregiver if you should continue all prescribed and over-the-counter medicines.  Keep all follow-up appointments with your caregiver. SEEK MEDICAL CARE IF:  You have abdominal pain and it increases or stays in one area (localizes).  You have a rash, stiff neck, or severe headache.  You are irritable, sleepy, or difficult to awaken.  You are weak, dizzy, or extremely thirsty. SEEK IMMEDIATE MEDICAL CARE IF:   You are unable to keep fluids down or you get worse despite treatment.  You have frequent episodes of vomiting or diarrhea.  You have blood or green matter (bile) in your vomit.  You have blood in your stool or your stool looks black and tarry.  You have not urinated in 6 to 8 hours, or you have only urinated a small amount of very dark urine.  You have a fever.  You faint. MAKE SURE YOU:   Understand these instructions.  Will watch your condition.  Will get help right away if you are not doing well or get worse. Document Released: 01/24/2005 Document Revised: 04/18/2011 Document Reviewed: 09/13/2010 ExitCare Patient Information 2015 [employer]. This information is not intended to replace advice given to you by your health care provider. Make sure you discuss any questions you have with your health care   provider.  

## 2013-12-13 NOTE — Progress Notes (Signed)
To provide support and encouragement, care continuity and to assess for needs, met with patient and his wife during weekly UT appt with Dr. Valere Dross. Patient reported he is tolerating RT though throat sore, having difficulty swallowing.  Using PEG exclusively.  Choosing to receive daily IVF for mgt of hydration and kidney function. He did not express any needs or concerns at this time, I encouraged him to contact me if that changes before I see him next, he verbalized agreement.  Gayleen Orem, RN, BSN, [place] at [employer] [phone]

## 2013-12-16 ENCOUNTER — Ambulatory Visit (HOSPITAL_BASED_OUTPATIENT_CLINIC_OR_DEPARTMENT_OTHER): Payer: BC Managed Care – PPO | Admitting: Hematology and Oncology

## 2013-12-16 ENCOUNTER — Encounter: Payer: Self-pay | Admitting: Radiation Oncology

## 2013-12-16 ENCOUNTER — Ambulatory Visit (HOSPITAL_BASED_OUTPATIENT_CLINIC_OR_DEPARTMENT_OTHER): Payer: BC Managed Care – PPO

## 2013-12-16 ENCOUNTER — Other Ambulatory Visit: Payer: Self-pay | Admitting: Hematology and Oncology

## 2013-12-16 ENCOUNTER — Ambulatory Visit
Admission: RE | Admit: 2013-12-16 | Discharge: 2013-12-16 | Disposition: A | Discharge status: Home / Self Care | Payer: BC Managed Care – PPO | Source: Ambulatory Visit | Attending: Radiation Oncology | Admitting: Radiation Oncology

## 2013-12-16 ENCOUNTER — Other Ambulatory Visit: Payer: Self-pay | Admitting: *Deleted

## 2013-12-16 ENCOUNTER — Ambulatory Visit (HOSPITAL_COMMUNITY)
Admission: RE | Admit: 2013-12-16 | Discharge: 2013-12-16 | Disposition: A | Discharge status: Home / Self Care | Payer: BC Managed Care – PPO | Source: Ambulatory Visit | Attending: Hematology and Oncology | Admitting: Hematology and Oncology

## 2013-12-16 ENCOUNTER — Encounter: Payer: Self-pay | Admitting: *Deleted

## 2013-12-16 VITALS — BP 122/73 | HR 85 | Temp 99.2°F | Resp 16

## 2013-12-16 VITALS — BP 121/75 | HR 91 | Temp 98.5°F | Resp 10 | Wt 200.2 lb

## 2013-12-16 DIAGNOSIS — R1310 Dysphagia, unspecified: Secondary | ICD-10-CM

## 2013-12-16 DIAGNOSIS — Z510 Encounter for antineoplastic radiation therapy: Secondary | ICD-10-CM | POA: Insufficient documentation

## 2013-12-16 DIAGNOSIS — E46 Unspecified protein-calorie malnutrition: Secondary | ICD-10-CM

## 2013-12-16 DIAGNOSIS — R110 Nausea: Secondary | ICD-10-CM

## 2013-12-16 DIAGNOSIS — Y833 Surgical operation with formation of external stoma as the cause of abnormal reaction of the patient, or of later complication, without mention of misadventure at the time of the procedure: Secondary | ICD-10-CM | POA: Insufficient documentation

## 2013-12-16 DIAGNOSIS — C01 Malignant neoplasm of base of tongue: Secondary | ICD-10-CM

## 2013-12-16 DIAGNOSIS — T85598A Other mechanical complication of other gastrointestinal prosthetic devices, implants and grafts, initial encounter: Secondary | ICD-10-CM | POA: Insufficient documentation

## 2013-12-16 DIAGNOSIS — Z931 Gastrostomy status: Secondary | ICD-10-CM | POA: Insufficient documentation

## 2013-12-16 DIAGNOSIS — K9423 Gastrostomy malfunction: Secondary | ICD-10-CM | POA: Insufficient documentation

## 2013-12-16 DIAGNOSIS — R070 Pain in throat: Secondary | ICD-10-CM

## 2013-12-16 DIAGNOSIS — R7989 Other specified abnormal findings of blood chemistry: Secondary | ICD-10-CM

## 2013-12-16 DIAGNOSIS — R633 Feeding difficulties: Secondary | ICD-10-CM

## 2013-12-16 MED ORDER — RADIAPLEXRX EX GEL
Freq: Once | CUTANEOUS | Status: AC
  Administered 2013-12-16: 11:00:00 via TOPICAL

## 2013-12-16 MED ORDER — HEPARIN SOD (PORK) LOCK FLUSH 100 UNIT/ML IV SOLN
250.0000 [IU] | Freq: Once | INTRAVENOUS | Status: AC | PRN
  Administered 2013-12-16: 250 [IU]
  Filled 2013-12-16: qty 5

## 2013-12-16 MED ORDER — IOHEXOL 300 MG/ML  SOLN
10.0000 mL | Freq: Once | INTRAMUSCULAR | Status: AC | PRN
  Administered 2013-12-16: 10 mL

## 2013-12-16 MED ORDER — SODIUM CHLORIDE 0.9 % IJ SOLN
10.0000 mL | INTRAMUSCULAR | Status: DC | PRN
  Administered 2013-12-16: 10 mL
  Filled 2013-12-16: qty 10

## 2013-12-16 MED ORDER — OXYCODONE HCL 10 MG PO TABS: 10.0000 mg | ORAL_TABLET | ORAL | Status: DC | PRN

## 2013-12-16 MED ORDER — BIAFINE EX EMUL
Freq: Two times a day (BID) | CUTANEOUS | Status: DC
  Administered 2013-12-16: 11:00:00 via TOPICAL

## 2013-12-16 MED ORDER — SODIUM CHLORIDE 0.9 % IV SOLN
1000.0000 mL | Freq: Once | INTRAVENOUS | Status: AC
  Administered 2013-12-16: 08:00:00 via INTRAVENOUS

## 2013-12-16 MED ORDER — LIDOCAINE VISCOUS 2 % MT SOLN
OROMUCOSAL | Status: AC
  Filled 2013-12-16: qty 15

## 2013-12-16 MED ORDER — LIDOCAINE VISCOUS 2 % MT SOLN
15.0000 mL | Freq: Once | OROMUCOSAL | Status: AC
  Administered 2013-12-16: 15 mL via OROMUCOSAL

## 2013-12-16 NOTE — Assessment & Plan Note (Signed)
I would recommend we continue on aggressive supportive care. He has completed his chemotherapy. He will continue on radiation therapy. I will see him on a weekly basis for supportive care.

## 2013-12-16 NOTE — Progress Notes (Signed)
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 There are right level 2 and level 3 adenopathy as described and right peritracheal lymph node within the superior mediastinum   09/12/2013 Surgery Laryngoscopy showed the oropharynx revealed a firm friable mass involving the lateral base of tongue and inferior tonsillar fossa.    09/13/2013 Pathology Results Accession: [id number] biopsy confirmed squamous cell carcinoma, HPV positive   09/25/2013 Imaging PET scan showed right base of tongue mass exhibits intense FDG uptake compatible with primary head neck carcinoma with associated hypermetabolic right level to adenopathy   10/03/2013 Procedure He had dental extraction   10/21/2013 - 12/06/2013 Chemotherapy He start on high dose cisplatin. Dose number 3 reduced 50% due to side-effects   11/04/2013 -  Radiation Therapy He received radiation therapy   11/19/2013 Procedure He had placement of right PICC line and removal of port   12/16/2013 Procedure The balloon in the feeding tube ruptured and the feeding tube fell out. It was replaced    INTERVAL HISTORY: Please see below for problem oriented charting. He is seen urgently today because his feeding tube fell out this  morning. His pain is stable. Has persistent nausea but no vomiting.  REVIEW OF SYSTEMS:   Constitutional: Denies fevers, chills or abnormal weight loss Eyes: Denies blurriness of vision Respiratory: Denies cough, dyspnea or wheezes Cardiovascular: Denies palpitation, chest discomfort or lower extremity swelling Skin: Denies abnormal skin rashes Lymphatics: Denies new lymphadenopathy or easy bruising Neurological:Denies numbness, tingling or new weaknesses Behavioral/Psych: Mood is stable, no new changes  All other systems were reviewed with the patient and are negative.  I have reviewed the past medical history, past surgical history, social history and family history with the patient and they are unchanged from previous note.  ALLERGIES:  is allergic to codeine and velosef.  MEDICATIONS:  Current Outpatient Prescriptions  Medication Sig Dispense Refill  . fentaNYL (DURAGESIC - DOSED MCG/HR) 25 MCG/HR patch   0  . fentaNYL (DURAGESIC - DOSED MCG/HR) 50 MCG/HR Place 1 patch (50 mcg total) onto the skin every 3 (three) days. 5 patch 0  . hyaluronate sodium (RADIAPLEXRX) GEL Apply 1 application topically 2 (two) times daily.    Marland Kitchen HYDROcodone-acetaminophen (NORCO) 7.5-325 MG per tablet Take 1-2 tablets by mouth every 4 (four) hours as needed for moderate pain. 50 tablet 0  . lidocaine-prilocaine (EMLA) cream Apply 1 application topically as needed (per port-a-cath).    . metoCLOPramide (REGLAN) 5 MG/5ML solution Take 10 mLs (10 mg total) by mouth 4 (four) times daily -  before meals and at bedtime. 480 mL 3  . Nutritional Supplements (FEEDING SUPPLEMENT, OSMOLITE 1.5 CAL,) LIQD Begin 1 can Osmolite 1.5 QID with 60 ml  free water before and after bolus feeding.  Increase to 1.5 cans QID on day 2.  Increase to goal of 1.5 cans BID and 2 cans BID day 3.  Flush with additional 240 cc water TID between feedings. Send formula please. 1659 mL 0  . ondansetron (ZOFRAN) 8 MG tablet Take 1 tablet (8 mg  total) by mouth every 8 (eight) hours as needed for nausea or vomiting. 60 tablet 6  . oxyCODONE (ROXICODONE) 15 MG immediate release tablet Take 1 tablet (15 mg total) by mouth every 4 (four) hours as needed for severe pain. 60 tablet 0  . Oxycodone HCl 10 MG TABS Take 1 tablet (10 mg total) by mouth every 4 (four) hours as needed. 60 tablet 0  . prochlorperazine (COMPAZINE) 10 MG tablet Take 1 tablet (10 mg total) by mouth every 6 (six) hours as needed for nausea or vomiting. 60 tablet 3  . prochlorperazine (COMPAZINE) 10 MG tablet TAKE 1 TABLET BY MOUTH EVERY 6 HOURS AS NEEDED FOR NAUSEA OR VOMITING 60 tablet 1  . scopolamine (TRANSDERM-SCOP) 1 MG/3DAYS Place 1 patch (1.5 mg total) onto the skin every 3 (three) days. 10 patch 12  . sennosides (SENOKOT) 8.8 MG/5ML syrup Take 5 mLs by mouth 2 (two) times daily as needed for mild constipation. 240 mL 1  . Sodium Chloride Flush 0.9 % SOLN injection Inject 10 mLs into the vein daily. 100 Syringe 3  . sodium fluoride (FLUORISHIELD) 1.1 % GEL dental gel Place 1 application onto teeth at bedtime.     No current facility-administered medications for this visit.   Facility-Administered Medications Ordered in Other Visits  Medication Dose Route Frequency Provider Last Rate Last Dose  . lidocaine (XYLOCAINE) 2 % viscous mouth solution           . topical emolient (BIAFINE) emulsion   Topical BID Eppie Gibson, MD        PHYSICAL EXAMINATION: ECOG PERFORMANCE STATUS: 1 - Symptomatic but completely ambulatory GENERAL:alert, no distress and comfortable SKIN: skin color, texture, turgor are normal, no rashes or significant lesions. Mild dermatitis around his neck EYES: normal, Conjunctiva are pink and non-injected, sclera clear OROPHARYNX:no exudate, no erythema and lips, buccal mucosa, and tongue normal . No active mucositis or thrush NECK: supple, thyroid normal size, non-tender, without nodularity LYMPH:  no palpable lymphadenopathy in the cervical,  axillary or inguinal LUNGS: clear to auscultation and percussion with normal breathing effort HEART: regular rate & rhythm and no murmurs and no lower extremity edema ABDOMEN:abdomen soft, non-tender and normal bowel sounds. Feeding tube appeared loose Musculoskeletal:no cyanosis of digits and no clubbing  NEURO: alert & oriented x 3 with fluent speech, no focal motor/sensory deficits  LABORATORY DATA:  I have reviewed the data as listed    Component Value Date/Time   NA 134* 12/09/2013 1319   NA 134* 11/27/2013 0813   K 3.9 12/09/2013 1319   K 4.1 11/27/2013 0813   CL 95* 11/27/2013 0813   CO2 30* 12/09/2013 1319   CO2 28 11/27/2013 0813   GLUCOSE 114 12/09/2013 1319   GLUCOSE 105* 11/27/2013 0813   BUN 24.9 12/09/2013 1319   BUN 19 11/27/2013 0813   CREATININE 1.1 12/09/2013 1319   CREATININE 1.16 11/27/2013 0813   CALCIUM 8.9 12/09/2013 1319   CALCIUM 8.9 11/27/2013 0813   PROT 6.7 12/09/2013 1319   PROT 6.9 11/27/2013 0813   ALBUMIN 3.1* 12/09/2013 1319   ALBUMIN 3.2* 11/27/2013 0813   AST 14 12/09/2013 1319  AST 13 11/27/2013 0813   ALT 16 12/09/2013 1319   ALT 10 11/27/2013 0813   ALKPHOS 70 12/09/2013 1319   ALKPHOS 61 11/27/2013 0813   BILITOT 0.32 12/09/2013 1319   BILITOT 0.6 11/27/2013 0813   GFRNONAA 40* 11/19/2013 1105   GFRAA 47* 11/19/2013 1105    No results found for: SPEP, UPEP  Lab Results  Component Value Date   WBC 7.0 12/09/2013   NEUTROABS 4.3 12/09/2013   HGB 11.2* 12/09/2013   HCT 33.0* 12/09/2013   MCV 91.7 12/09/2013   PLT 428* 12/09/2013      Chemistry      Component Value Date/Time   NA 134* 12/09/2013 1319   NA 134* 11/27/2013 0813   K 3.9 12/09/2013 1319   K 4.1 11/27/2013 0813   CL 95* 11/27/2013 0813   CO2 30* 12/09/2013 1319   CO2 28 11/27/2013 0813   BUN 24.9 12/09/2013 1319   BUN 19 11/27/2013 0813   CREATININE 1.1 12/09/2013 1319   CREATININE 1.16 11/27/2013 0813      Component Value Date/Time   CALCIUM 8.9  12/09/2013 1319   CALCIUM 8.9 11/27/2013 0813   ALKPHOS 70 12/09/2013 1319   ALKPHOS 61 11/27/2013 0813   AST 14 12/09/2013 1319   AST 13 11/27/2013 0813   ALT 16 12/09/2013 1319   ALT 10 11/27/2013 0813   BILITOT 0.32 12/09/2013 1319   BILITOT 0.6 11/27/2013 0813       RADIOGRAPHIC STUDIES: I have personally reviewed the radiological images as listed and agreed with the findings in the report. Ir Replc Gastro/colonic Tube Percut W/fluoro  12/16/2013   CLINICAL DATA:  58 year old male with chronic indwelling percutaneous gastrostomy tube with a a ruptured balloon. He requires exchange for a new gastrostomy tube.  EXAM: GASTROSTOMY CATHETER REPLACEMENT  Date: 12/16/2013  PROCEDURE: 1. Exchange of gastrostomy tube Interventional Radiologist:  Criselda Peaches, MD  ANESTHESIA/SEDATION: None required  FLUOROSCOPY TIME:  2 seconds (less than 1 mGy)  CONTRAST:  89mL OMNIPAQUE IOHEXOL 300 MG/ML  SOLN  TECHNIQUE: Informed consent was obtained from the patient following explanation of the procedure, risks, benefits and alternatives. The patient understands, agrees and consents for the procedure. All questions were addressed. A time out was performed.  Maximal barrier sterile technique utilized including caps, mask, sterile gowns, sterile gloves, large sterile drape, hand hygiene, and Betadine skin prep.  The existing gastrostomy tube was removed. A new 78 French balloon retention Alinda Dooms gastrostomy tube was carefully advanced through the existing ostomy. The balloon was inflated with saline and pulse not against the anterior abdominal wall. A gentle an injection of contrast material under fluoroscopy confirmed intragastric location. The external bumper was secured to the skin surface. The patient tolerated the procedure well.  COMPLICATIONS: None.  IMPRESSION: Successful exchange for a new 28 French balloon retention percutaneous gastrostomy tube.  Signed,  Criselda Peaches, MD  [facility]  [facility]   Electronically Signed   By: Jacqulynn Cadet M.D.   On: 12/16/2013 17:37     ASSESSMENT & PLAN:  Squamous cell carcinoma of the right base of tongue I would recommend we continue on aggressive supportive care. He has completed his chemotherapy. He will continue on radiation therapy. I will see him on a weekly basis for supportive care.  Feeding tube dysfunction His feeding tube fell out. I recommend IR management ASAP  Nausea without vomiting He will continue on Reglan before meals and scopolamine  patch. I felt that that is a component of mucus gagging. He has benefited from IV fluids and I recommend we continue the same.  Protein calorie malnutrition This is getting worse. He has been seen by a dietitian who recommended increase tube feeds as tolerated after we get his feeding tube replaced.  Throat pain in adult His throat pain is stable. He will continue fentanyl patch to 50 mcg every 72 hours and increased breakthrough oxycodone to 10 mg as needed. I refilled his prescription today     No orders of the defined types were placed in this encounter.   All questions were answered. The patient knows to call the clinic with any problems, questions or concerns. No barriers to learning was detected. I spent 40 minutes counseling the patient face to face. The total time spent in the appointment was 55 minutes and more than 50% was on counseling and review of test results     [facility], [place], MD 12/16/2013 8:29 PM

## 2013-12-16 NOTE — Progress Notes (Signed)
   Weekly Management Note:  outpatient    ICD-9-CM ICD-10-CM   1. Squamous cell carcinoma of the right base of tongue 141.0 C01     Current Dose:  60 Gy  Projected Dose: 70 Gy   Narrative:  The patient presents for routine under treatment assessment.  CBCT/MVCT images/Port film x-rays were reviewed.  The chart was checked. Sore throat, PEG fell out this weekend, getting checked today in radiology.  IVF at present by med/onc.  Irritated skin over neck, brisk gag reflex.  Physical Findings:  weight is 200 lb 3.2 oz (90.81 kg). His oral temperature is 98.5 F (36.9 C). His blood pressure is 121/75 and his pulse is 91. His respiration is 10 and oxygen saturation is 100%.  no oral thrush. Mucosa in mouth is showing thick/moist sputum. Erythema/dryness over neck.  CBC    Component Value Date/Time   WBC 7.0 12/09/2013 1319   WBC 4.8 11/19/2013 1105   RBC 3.60* 12/09/2013 1319   RBC 4.10* 11/19/2013 1105   HGB 11.2* 12/09/2013 1319   HGB 12.6* 11/19/2013 1105   HCT 33.0* 12/09/2013 1319   HCT 35.8* 11/19/2013 1105   PLT 428* 12/09/2013 1319   PLT 103* 11/19/2013 1105   MCV 91.7 12/09/2013 1319   MCV 87.3 11/19/2013 1105   MCH 31.1 12/09/2013 1319   MCH 30.7 11/19/2013 1105   MCHC 33.9 12/09/2013 1319   MCHC 35.2 11/19/2013 1105   RDW 14.2 12/09/2013 1319   RDW 12.5 11/19/2013 1105   LYMPHSABS 1.4 12/09/2013 1319   LYMPHSABS 0.6* 11/19/2013 1105   MONOABS 1.3* 12/09/2013 1319   MONOABS 0.8 11/19/2013 1105   EOSABS 0.0 12/09/2013 1319   EOSABS 0.0 11/19/2013 1105   BASOSABS 0.0 12/09/2013 1319   BASOSABS 0.0 11/19/2013 1105     CMP     Component Value Date/Time   NA 134* 12/09/2013 1319   NA 134* 11/27/2013 0813   K 3.9 12/09/2013 1319   K 4.1 11/27/2013 0813   CL 95* 11/27/2013 0813   CO2 30* 12/09/2013 1319   CO2 28 11/27/2013 0813   GLUCOSE 114 12/09/2013 1319   GLUCOSE 105* 11/27/2013 0813   BUN 24.9 12/09/2013 1319   BUN 19 11/27/2013 0813   CREATININE 1.1  12/09/2013 1319   CREATININE 1.16 11/27/2013 0813   CALCIUM 8.9 12/09/2013 1319   CALCIUM 8.9 11/27/2013 0813   PROT 6.7 12/09/2013 1319   PROT 6.9 11/27/2013 0813   ALBUMIN 3.1* 12/09/2013 1319   ALBUMIN 3.2* 11/27/2013 0813   AST 14 12/09/2013 1319   AST 13 11/27/2013 0813   ALT 16 12/09/2013 1319   ALT 10 11/27/2013 0813   ALKPHOS 70 12/09/2013 1319   ALKPHOS 61 11/27/2013 0813   BILITOT 0.32 12/09/2013 1319   BILITOT 0.6 11/27/2013 0813   GFRNONAA 40* 11/19/2013 1105   GFRAA 47* 11/19/2013 1105     Impression:  The patient is tolerating radiotherapy.   Plan:  Continue radiotherapy as planned. Biafine, Radiaplex given for skin.  -----------------------------------  Eppie Gibson, MD

## 2013-12-16 NOTE — Patient Instructions (Signed)

## 2013-12-16 NOTE — Assessment & Plan Note (Signed)
He will continue on Reglan before meals and scopolamine patch. I felt that that is a component of mucus gagging. He has benefited from IV fluids and I recommend we continue the same.

## 2013-12-16 NOTE — Addendum Note (Signed)
Encounter addended by: Jenene Slicker, RN on: 12/16/2013 10:38 AM<BR>     Documentation filed: [employer], Inpatient MAR, Orders

## 2013-12-16 NOTE — Assessment & Plan Note (Addendum)
This is getting worse. He has been seen by a dietitian who recommended increase tube feeds as tolerated after we get his feeding tube replaced.

## 2013-12-16 NOTE — Progress Notes (Addendum)
He rates his pain as mild. Pt complains of skin sensitiveness and sore throat, Chills, Fatigue and Generalized Weakness.  Pt presenting appropriate quality, quantity and organization of sentences. Pt has had dysphagia for both solids and liquids. Gastrostomy Tube, 5-6 cans Osmolite plus water.  Oral exam reveals moistened mouth with a large amount of thick white sputum. Skin hyperpigmentation, erythema, Dryness over neck.  Continues to apply Radiaplex as directed.  Currently is getting infused with 0.9% NS at 566ml/hr.  Pt wife reports his feeding tube partially pulled on over the weekend. She reports one of the stitches came loose.  She reports she pushed to tube back in.  They have an appointment with xray at 1030 to check placement.

## 2013-12-16 NOTE — Assessment & Plan Note (Signed)
His feeding tube fell out. I recommend IR management ASAP

## 2013-12-16 NOTE — Assessment & Plan Note (Signed)
His throat pain is stable. He will continue fentanyl patch to 50 mcg every 72 hours and increased breakthrough oxycodone to 10 mg as needed. I refilled his prescription today

## 2013-12-17 ENCOUNTER — Telehealth: Payer: Self-pay | Admitting: *Deleted

## 2013-12-17 ENCOUNTER — Ambulatory Visit (HOSPITAL_BASED_OUTPATIENT_CLINIC_OR_DEPARTMENT_OTHER): Payer: BC Managed Care – PPO

## 2013-12-17 ENCOUNTER — Ambulatory Visit: Payer: Self-pay | Admitting: Hematology and Oncology

## 2013-12-17 ENCOUNTER — Ambulatory Visit
Admission: RE | Admit: 2013-12-17 | Discharge: 2013-12-17 | Disposition: A | Discharge status: Home / Self Care | Payer: BC Managed Care – PPO | Source: Ambulatory Visit | Attending: Radiation Oncology | Admitting: Radiation Oncology

## 2013-12-17 ENCOUNTER — Other Ambulatory Visit: Payer: Self-pay | Admitting: Hematology and Oncology

## 2013-12-17 ENCOUNTER — Ambulatory Visit: Payer: BC Managed Care – PPO

## 2013-12-17 ENCOUNTER — Telehealth: Payer: Self-pay | Admitting: Hematology and Oncology

## 2013-12-17 DIAGNOSIS — R1310 Dysphagia, unspecified: Secondary | ICD-10-CM

## 2013-12-17 DIAGNOSIS — Z95828 Presence of other vascular implants and grafts: Secondary | ICD-10-CM

## 2013-12-17 DIAGNOSIS — R7989 Other specified abnormal findings of blood chemistry: Secondary | ICD-10-CM

## 2013-12-17 DIAGNOSIS — R748 Abnormal levels of other serum enzymes: Secondary | ICD-10-CM

## 2013-12-17 DIAGNOSIS — R112 Nausea with vomiting, unspecified: Secondary | ICD-10-CM

## 2013-12-17 DIAGNOSIS — Z510 Encounter for antineoplastic radiation therapy: Secondary | ICD-10-CM | POA: Diagnosis not present

## 2013-12-17 DIAGNOSIS — C01 Malignant neoplasm of base of tongue: Secondary | ICD-10-CM

## 2013-12-17 DIAGNOSIS — D630 Anemia in neoplastic disease: Secondary | ICD-10-CM

## 2013-12-17 LAB — CBC WITH DIFFERENTIAL/PLATELET
BASO%: 0.1 % (ref 0.0–2.0)
Basophils Absolute: 0 10*3/uL (ref 0.0–0.1)
EOS%: 0.4 % (ref 0.0–7.0)
Eosinophils Absolute: 0 10*3/uL (ref 0.0–0.5)
HCT: 31.1 % — ABNORMAL LOW (ref 38.4–49.9)
HGB: 10.6 g/dL — ABNORMAL LOW (ref 13.0–17.1)
LYMPH%: 14.4 % (ref 14.0–49.0)
MCH: 31.1 pg (ref 27.2–33.4)
MCHC: 34.1 g/dL (ref 32.0–36.0)
MCV: 91.2 fL (ref 79.3–98.0)
MONO#: 1.4 10*3/uL — ABNORMAL HIGH (ref 0.1–0.9)
MONO%: 15.7 % — ABNORMAL HIGH (ref 0.0–14.0)
NEUT#: 6.4 10*3/uL (ref 1.5–6.5)
NEUT%: 69.4 % (ref 39.0–75.0)
Platelets: 302 10*3/uL (ref 140–400)
RBC: 3.41 10*6/uL — ABNORMAL LOW (ref 4.20–5.82)
RDW: 14.8 % — ABNORMAL HIGH (ref 11.0–14.6)
WBC: 9.2 10*3/uL (ref 4.0–10.3)
lymph#: 1.3 10*3/uL (ref 0.9–3.3)
nRBC: 0 % (ref 0–0)

## 2013-12-17 LAB — COMPREHENSIVE METABOLIC PANEL (CC13)
ALT: 13 U/L (ref 0–55)
AST: 15 U/L (ref 5–34)
Albumin: 3.2 g/dL — ABNORMAL LOW (ref 3.5–5.0)
Alkaline Phosphatase: 74 U/L (ref 40–150)
Anion Gap: 3 mEq/L (ref 3–11)
BUN: 17.1 mg/dL (ref 7.0–26.0)
CO2: 30 mEq/L — ABNORMAL HIGH (ref 22–29)
Calcium: 9.5 mg/dL (ref 8.4–10.4)
Chloride: 99 mEq/L (ref 98–109)
Creatinine: 1.2 mg/dL (ref 0.7–1.3)
Glucose: 107 mg/dl (ref 70–140)
Potassium: 4.8 mEq/L (ref 3.5–5.1)
Sodium: 132 mEq/L — ABNORMAL LOW (ref 136–145)
Total Bilirubin: 0.42 mg/dL (ref 0.20–1.20)
Total Protein: 6.9 g/dL (ref 6.4–8.3)

## 2013-12-17 MED ORDER — HEPARIN SOD (PORK) LOCK FLUSH 100 UNIT/ML IV SOLN
250.0000 [IU] | Freq: Once | INTRAVENOUS | Status: AC | PRN
  Administered 2013-12-17: 250 [IU]
  Filled 2013-12-17: qty 5

## 2013-12-17 MED ORDER — ONDANSETRON 8 MG/NS 50 ML IVPB
INTRAVENOUS | Status: AC
  Filled 2013-12-17: qty 8

## 2013-12-17 MED ORDER — SODIUM CHLORIDE 0.9 % IV SOLN
Freq: Once | INTRAVENOUS | Status: AC
  Administered 2013-12-17: 11:00:00 via INTRAVENOUS

## 2013-12-17 MED ORDER — SODIUM CHLORIDE 0.9 % IJ SOLN
10.0000 mL | INTRAMUSCULAR | Status: DC | PRN
  Administered 2013-12-17: 10 mL via INTRAVENOUS
  Filled 2013-12-17: qty 10

## 2013-12-17 MED ORDER — ONDANSETRON 8 MG/50ML IVPB (CHCC)
8.0000 mg | Freq: Every day | INTRAVENOUS | Status: DC | PRN
  Administered 2013-12-17: 8 mg via INTRAVENOUS

## 2013-12-17 MED ORDER — SODIUM CHLORIDE 0.9 % IJ SOLN
10.0000 mL | INTRAMUSCULAR | Status: DC | PRN
  Administered 2013-12-17: 10 mL
  Filled 2013-12-17: qty 10

## 2013-12-17 NOTE — Patient Instructions (Signed)

## 2013-12-17 NOTE — Patient Instructions (Signed)
PICC Home Guide A peripherally inserted central catheter (PICC) is a long, thin, flexible tube that is inserted into a vein in the upper arm. It is a form of intravenous (IV) access. It is considered to be a "central" line because the tip of the PICC ends in a large vein in your chest. This large vein is called the superior vena cava (SVC). The PICC tip ends in the SVC because there is a lot of blood flow in the SVC. This allows medicines and IV fluids to be quickly distributed throughout the body. The PICC is inserted using a sterile technique by a specially trained nurse or physician. After the PICC is inserted, a chest X-ray exam is done to be sure it is in the correct place.  A PICC may be placed for different reasons, such as:  To give medicines and liquid nutrition that can only be given through a central line. Examples are:  Certain antibiotic treatments.  Chemotherapy.  Total parenteral nutrition (TPN).  To take frequent blood samples.  To give IV fluids and blood products.  If there is difficulty placing a peripheral intravenous (PIV) catheter. If taken care of properly, a PICC can remain in place for several months. A PICC can also allow a person to go home from the hospital early. Medicine and PICC care can be managed at home by a family member or home health care team. WHAT PROBLEMS CAN HAPPEN WHEN I HAVE A PICC? Problems with a PICC can occasionally occur. These may include the following:  A blood clot (thrombus) forming in or at the tip of the PICC. This can cause the PICC to become clogged. A clot-dissolving medicine called tissue plasminogen activator (tPA) can be given through the PICC to help break up the clot.  Inflammation of the vein (phlebitis) in which the PICC is placed. Signs of inflammation may include redness, pain at the insertion site, red streaks, or being able to feel a "cord" in the vein where the PICC is located.  Infection in the PICC or at the insertion  site. Signs of infection may include fever, chills, redness, swelling, or pus drainage from the PICC insertion site.  PICC movement (malposition). The PICC tip may move from its original position due to excessive physical activity, forceful coughing, sneezing, or vomiting.  A break or cut in the PICC. It is important to not use scissors near the PICC.  Nerve or tendon irritation or injury during PICC insertion. WHAT SHOULD I KEEP IN MIND ABOUT ACTIVITIES WHEN I HAVE A PICC?  You may bend your arm and move it freely. If your PICC is near or at the bend of your elbow, avoid activity with repeated motion at the elbow.  Rest at home for the remainder of the day following PICC line insertion.  Avoid lifting heavy objects as instructed by your health care provider.  Avoid using a crutch with the arm on the same side as your PICC. You may need to use a walker. WHAT SHOULD I KNOW ABOUT MY PICC DRESSING?  Keep your PICC bandage (dressing) clean and dry to prevent infection.  Ask your health care provider when you may shower. Ask your health care provider to teach you how to wrap the PICC when you do take a shower.  Change the PICC dressing as instructed by your health care provider.  Change your PICC dressing if it becomes loose or wet. WHAT SHOULD I KNOW ABOUT PICC CARE?  Check the PICC insertion site   daily for leakage, redness, swelling, or pain.  Do not take a bath, swim, or use hot tubs when you have a PICC. Cover PICC line with clear plastic wrap and tape to keep it dry while showering.  Flush the PICC as directed by your health care provider. Let your health care provider know right away if the PICC is difficult to flush or does not flush. Do not use force to flush the PICC.  Do not use a syringe that is less than 10 mL to flush the PICC.  Never pull or tug on the PICC.  Avoid blood pressure checks on the arm with the PICC.  Keep your PICC identification card with you at all  times.  Do not take the PICC out yourself. Only a trained clinical professional should remove the PICC. SEEK IMMEDIATE MEDICAL CARE IF:  Your PICC is accidentally pulled all the way out. If this happens, cover the insertion site with a bandage or gauze dressing. Do not throw the PICC away. Your health care provider will need to inspect it.  Your PICC was tugged or pulled and has partially come out. Do not  push the PICC back in.  There is any type of drainage, redness, or swelling where the PICC enters the skin.  You cannot flush the PICC, it is difficult to flush, or the PICC leaks around the insertion site when it is flushed.  You hear a "flushing" sound when the PICC is flushed.  You have pain, discomfort, or numbness in your arm, shoulder, or jaw on the same side as the PICC.  You feel your heart "racing" or skipping beats.  You notice a hole or tear in the PICC.  You develop chills or a fever. MAKE SURE YOU:   Understand these instructions.  Will watch your condition.  Will get help right away if you are not doing well or get worse. Document Released: 07/31/2002 Document Revised: 06/10/2013 Document Reviewed: 10/01/2012 [employer] Patient Information 2015 [employer]. This information is not intended to replace advice given to you by your health care provider. Make sure you discuss any questions you have with your health care provider.  

## 2013-12-17 NOTE — Telephone Encounter (Signed)
-----   Message from Heath Lark, MD sent at 12/17/2013 11:47 AM EST ----- Regarding: labs Can you tell his wife labs are good. Hope he is doing well after tube exchange yesterday  ----- Message -----    From: [facility]    Sent: 12/17/2013  11:13 AM      To: Heath Lark, MD

## 2013-12-17 NOTE — Telephone Encounter (Signed)
S/w wife in infusion room. Gave her copy of pt's labs and informed they are good per Dr. Alvy Bimler.  She verbalized understanding and states pt's feeding tube was exchanged w/o incident.  It is working well today.

## 2013-12-18 ENCOUNTER — Ambulatory Visit
Admission: RE | Admit: 2013-12-18 | Discharge: 2013-12-18 | Disposition: A | Discharge status: Home / Self Care | Payer: BC Managed Care – PPO | Source: Ambulatory Visit | Attending: Radiation Oncology | Admitting: Radiation Oncology

## 2013-12-18 ENCOUNTER — Ambulatory Visit (HOSPITAL_BASED_OUTPATIENT_CLINIC_OR_DEPARTMENT_OTHER): Payer: BC Managed Care – PPO

## 2013-12-18 DIAGNOSIS — R7989 Other specified abnormal findings of blood chemistry: Secondary | ICD-10-CM

## 2013-12-18 DIAGNOSIS — R1310 Dysphagia, unspecified: Secondary | ICD-10-CM

## 2013-12-18 DIAGNOSIS — Z510 Encounter for antineoplastic radiation therapy: Secondary | ICD-10-CM | POA: Diagnosis not present

## 2013-12-18 DIAGNOSIS — R748 Abnormal levels of other serum enzymes: Secondary | ICD-10-CM

## 2013-12-18 DIAGNOSIS — R112 Nausea with vomiting, unspecified: Secondary | ICD-10-CM

## 2013-12-18 MED ORDER — SODIUM CHLORIDE 0.9 % IV SOLN
1000.0000 mL | Freq: Once | INTRAVENOUS | Status: AC
  Administered 2013-12-18: 09:00:00 via INTRAVENOUS

## 2013-12-18 MED ORDER — SODIUM CHLORIDE 0.9 % IJ SOLN
10.0000 mL | INTRAMUSCULAR | Status: DC | PRN
  Administered 2013-12-18: 10 mL
  Filled 2013-12-18: qty 10

## 2013-12-18 MED ORDER — HEPARIN SOD (PORK) LOCK FLUSH 100 UNIT/ML IV SOLN
250.0000 [IU] | Freq: Once | INTRAVENOUS | Status: AC | PRN
  Administered 2013-12-18: 250 [IU]
  Filled 2013-12-18: qty 5

## 2013-12-18 NOTE — Patient Instructions (Signed)

## 2013-12-19 ENCOUNTER — Ambulatory Visit
Admission: RE | Admit: 2013-12-19 | Discharge: 2013-12-19 | Disposition: A | Discharge status: Home / Self Care | Payer: BC Managed Care – PPO | Source: Ambulatory Visit | Attending: Radiation Oncology | Admitting: Radiation Oncology

## 2013-12-19 ENCOUNTER — Ambulatory Visit (HOSPITAL_BASED_OUTPATIENT_CLINIC_OR_DEPARTMENT_OTHER): Payer: BC Managed Care – PPO

## 2013-12-19 DIAGNOSIS — R748 Abnormal levels of other serum enzymes: Secondary | ICD-10-CM

## 2013-12-19 DIAGNOSIS — Z510 Encounter for antineoplastic radiation therapy: Secondary | ICD-10-CM | POA: Diagnosis not present

## 2013-12-19 DIAGNOSIS — R1310 Dysphagia, unspecified: Secondary | ICD-10-CM

## 2013-12-19 DIAGNOSIS — R7989 Other specified abnormal findings of blood chemistry: Secondary | ICD-10-CM

## 2013-12-19 MED ORDER — HEPARIN SOD (PORK) LOCK FLUSH 100 UNIT/ML IV SOLN
250.0000 [IU] | Freq: Once | INTRAVENOUS | Status: AC | PRN
  Administered 2013-12-19: 250 [IU]
  Filled 2013-12-19: qty 5

## 2013-12-19 MED ORDER — SODIUM CHLORIDE 0.9 % IJ SOLN
10.0000 mL | INTRAMUSCULAR | Status: DC | PRN
  Administered 2013-12-19: 10 mL
  Filled 2013-12-19: qty 10

## 2013-12-19 MED ORDER — SODIUM CHLORIDE 0.9 % IV SOLN
Freq: Once | INTRAVENOUS | Status: AC
  Administered 2013-12-19: 09:00:00 via INTRAVENOUS

## 2013-12-19 NOTE — Progress Notes (Addendum)
To provide support and encouragement, care continuity and to assess for needs, met with patient and his wife during weekly UT appt. 1. Patient wife reported that PEG tubing seemed to come out several inches over weekend, arrangements have been made for it to be evaluated by IR after his appt.  2. Instilling 4 cans daily Ensure/Boost via PEG with 60 cc free water each feeding. 3. Absence of any taste sensation. 4. Taking Zofran/Compazine as frequently as prescribed to avoid N&V. 5. Mouth is very dry, lozenges effective.  I encouraged salt/baking soda rinses several times daily to help with oral hygiene. He denied additional needs at this time, understands he can contact me should that change.  Gayleen Orem, RN, BSN, [place] at [place] [phone]

## 2013-12-19 NOTE — Patient Instructions (Signed)
Dehydration, Adult Dehydration is when you lose more fluids from the body than you take in. Vital organs like the kidneys, brain, and heart cannot function without a proper amount of fluids and salt. Any loss of fluids from the body can cause dehydration.  CAUSES   Vomiting.  Diarrhea.  Excessive sweating.  Excessive urine output.  Fever. SYMPTOMS  Mild dehydration  Thirst.  Dry lips.  Slightly dry mouth. Moderate dehydration  Very dry mouth.  Sunken eyes.  Skin does not bounce back quickly when lightly pinched and released.  Dark urine and decreased urine production.  Decreased tear production.  Headache. Severe dehydration  Very dry mouth.  Extreme thirst.  Rapid, weak pulse (more than 100 beats per minute at rest).  Cold hands and feet.  Not able to sweat in spite of heat and temperature.  Rapid breathing.  Blue lips.  Confusion and lethargy.  Difficulty being awakened.  Minimal urine production.  No tears. DIAGNOSIS  Your caregiver will diagnose dehydration based on your symptoms and your exam. Blood and urine tests will help confirm the diagnosis. The diagnostic evaluation should also identify the cause of dehydration. TREATMENT  Treatment of mild or moderate dehydration can often be done at home by increasing the amount of fluids that you drink. It is best to drink small amounts of fluid more often. Drinking too much at one time can make vomiting worse. Refer to the home care instructions below. Severe dehydration needs to be treated at the hospital where you will probably be given intravenous (IV) fluids that contain water and electrolytes. HOME CARE INSTRUCTIONS   Ask your caregiver about specific rehydration instructions.  Drink enough fluids to keep your urine clear or pale yellow.  Drink small amounts frequently if you have nausea and vomiting.  Eat as you normally do.  Avoid:  Foods or drinks high in sugar.  Carbonated  drinks.  Juice.  Extremely hot or cold fluids.  Drinks with caffeine.  Fatty, greasy foods.  Alcohol.  Tobacco.  Overeating.  Gelatin desserts.  Wash your hands well to avoid spreading bacteria and viruses.  Only take over-the-counter or prescription medicines for pain, discomfort, or fever as directed by your caregiver.  Ask your caregiver if you should continue all prescribed and over-the-counter medicines.  Keep all follow-up appointments with your caregiver. SEEK MEDICAL CARE IF:  You have abdominal pain and it increases or stays in one area (localizes).  You have a rash, stiff neck, or severe headache.  You are irritable, sleepy, or difficult to awaken.  You are weak, dizzy, or extremely thirsty. SEEK IMMEDIATE MEDICAL CARE IF:   You are unable to keep fluids down or you get worse despite treatment.  You have frequent episodes of vomiting or diarrhea.  You have blood or green matter (bile) in your vomit.  You have blood in your stool or your stool looks black and tarry.  You have not urinated in 6 to 8 hours, or you have only urinated a small amount of very dark urine.  You have a fever.  You faint. MAKE SURE YOU:   Understand these instructions.  Will watch your condition.  Will get help right away if you are not doing well or get worse. Document Released: 01/24/2005 Document Revised: 04/18/2011 Document Reviewed: 09/13/2010 ExitCare Patient Information 2015 [employer]. This information is not intended to replace advice given to you by your health care provider. Make sure you discuss any questions you have with your health care   provider.  

## 2013-12-20 ENCOUNTER — Ambulatory Visit: Payer: BC Managed Care – PPO | Admitting: Nutrition

## 2013-12-20 ENCOUNTER — Ambulatory Visit
Admission: RE | Admit: 2013-12-20 | Discharge: 2013-12-20 | Disposition: A | Discharge status: Home / Self Care | Payer: BC Managed Care – PPO | Source: Ambulatory Visit | Attending: Radiation Oncology | Admitting: Radiation Oncology

## 2013-12-20 ENCOUNTER — Ambulatory Visit (HOSPITAL_BASED_OUTPATIENT_CLINIC_OR_DEPARTMENT_OTHER): Payer: BC Managed Care – PPO

## 2013-12-20 DIAGNOSIS — R1310 Dysphagia, unspecified: Secondary | ICD-10-CM

## 2013-12-20 DIAGNOSIS — R7989 Other specified abnormal findings of blood chemistry: Secondary | ICD-10-CM

## 2013-12-20 DIAGNOSIS — R748 Abnormal levels of other serum enzymes: Secondary | ICD-10-CM

## 2013-12-20 DIAGNOSIS — Z510 Encounter for antineoplastic radiation therapy: Secondary | ICD-10-CM | POA: Diagnosis not present

## 2013-12-20 MED ORDER — SODIUM CHLORIDE 0.9 % IV SOLN
Freq: Once | INTRAVENOUS | Status: AC
  Administered 2013-12-20: 09:00:00 via INTRAVENOUS

## 2013-12-20 MED ORDER — SODIUM CHLORIDE 0.9 % IJ SOLN
10.0000 mL | INTRAMUSCULAR | Status: DC | PRN
  Administered 2013-12-20: 10 mL
  Filled 2013-12-20: qty 10

## 2013-12-20 MED ORDER — HEPARIN SOD (PORK) LOCK FLUSH 100 UNIT/ML IV SOLN
250.0000 [IU] | Freq: Once | INTRAVENOUS | Status: AC | PRN
  Administered 2013-12-20: 250 [IU]
  Filled 2013-12-20: qty 5

## 2013-12-20 MED ORDER — HEPARIN SOD (PORK) LOCK FLUSH 100 UNIT/ML IV SOLN
500.0000 [IU] | Freq: Once | INTRAVENOUS | Status: AC | PRN
  Administered 2013-12-20: 250 [IU]
  Filled 2013-12-20: qty 5

## 2013-12-20 NOTE — Patient Instructions (Signed)
Dehydration, Adult Dehydration is when you lose more fluids from the body than you take in. Vital organs like the kidneys, brain, and heart cannot function without a proper amount of fluids and salt. Any loss of fluids from the body can cause dehydration.  CAUSES   Vomiting.  Diarrhea.  Excessive sweating.  Excessive urine output.  Fever. SYMPTOMS  Mild dehydration  Thirst.  Dry lips.  Slightly dry mouth. Moderate dehydration  Very dry mouth.  Sunken eyes.  Skin does not bounce back quickly when lightly pinched and released.  Dark urine and decreased urine production.  Decreased tear production.  Headache. Severe dehydration  Very dry mouth.  Extreme thirst.  Rapid, weak pulse (more than 100 beats per minute at rest).  Cold hands and feet.  Not able to sweat in spite of heat and temperature.  Rapid breathing.  Blue lips.  Confusion and lethargy.  Difficulty being awakened.  Minimal urine production.  No tears. DIAGNOSIS  Your caregiver will diagnose dehydration based on your symptoms and your exam. Blood and urine tests will help confirm the diagnosis. The diagnostic evaluation should also identify the cause of dehydration. TREATMENT  Treatment of mild or moderate dehydration can often be done at home by increasing the amount of fluids that you drink. It is best to drink small amounts of fluid more often. Drinking too much at one time can make vomiting worse. Refer to the home care instructions below. Severe dehydration needs to be treated at the hospital where you will probably be given intravenous (IV) fluids that contain water and electrolytes. HOME CARE INSTRUCTIONS   Ask your caregiver about specific rehydration instructions.  Drink enough fluids to keep your urine clear or pale yellow.  Drink small amounts frequently if you have nausea and vomiting.  Eat as you normally do.  Avoid:  Foods or drinks high in sugar.  Carbonated  drinks.  Juice.  Extremely hot or cold fluids.  Drinks with caffeine.  Fatty, greasy foods.  Alcohol.  Tobacco.  Overeating.  Gelatin desserts.  Wash your hands well to avoid spreading bacteria and viruses.  Only take over-the-counter or prescription medicines for pain, discomfort, or fever as directed by your caregiver.  Ask your caregiver if you should continue all prescribed and over-the-counter medicines.  Keep all follow-up appointments with your caregiver. SEEK MEDICAL CARE IF:  You have abdominal pain and it increases or stays in one area (localizes).  You have a rash, stiff neck, or severe headache.  You are irritable, sleepy, or difficult to awaken.  You are weak, dizzy, or extremely thirsty. SEEK IMMEDIATE MEDICAL CARE IF:   You are unable to keep fluids down or you get worse despite treatment.  You have frequent episodes of vomiting or diarrhea.  You have blood or green matter (bile) in your vomit.  You have blood in your stool or your stool looks black and tarry.  You have not urinated in 6 to 8 hours, or you have only urinated a small amount of very dark urine.  You have a fever.  You faint. MAKE SURE YOU:   Understand these instructions.  Will watch your condition.  Will get help right away if you are not doing well or get worse. Document Released: 01/24/2005 Document Revised: 04/18/2011 Document Reviewed: 09/13/2010 ExitCare Patient Information 2015 [employer]. This information is not intended to replace advice given to you by your health care provider. Make sure you discuss any questions you have with your health care   provider.  

## 2013-12-20 NOTE — Progress Notes (Signed)
Nutrition followup completed with patient and wife.   Patient reports he is feeling better and is able to talk little easier. Weight is generally stable and was documented as 200 pounds on November 9. Patient tolerating Osmolite 1.5- 5 to 6 cans daily, via his feeding tube. Labs reflect sodium of 132 and albumin 3.2 on November 10.  Nutrition diagnosis: Inadequate oral intake continues.  Intervention: Educated patient to continue to strive for Osmolite 1.5 - 6 cans daily to promote weight loss. Patient will continue free water flushes. Educated patient on strategies for beginning oral intake once healing allows.  Monitoring, evaluation, goals: Patient will tolerate tube feedings to minimize weight loss.  Next visit: Will follow as needed throughout remaining treatments.  **Disclaimer: This note was dictated with voice recognition software. Similar sounding words can inadvertently be transcribed and this note may contain transcription errors which may not have been corrected upon publication of note.**

## 2013-12-23 ENCOUNTER — Encounter: Payer: Self-pay | Admitting: Radiation Oncology

## 2013-12-23 ENCOUNTER — Ambulatory Visit
Admission: RE | Admit: 2013-12-23 | Discharge: 2013-12-23 | Disposition: A | Discharge status: Home / Self Care | Payer: BC Managed Care – PPO | Source: Ambulatory Visit | Attending: Radiation Oncology | Admitting: Radiation Oncology

## 2013-12-23 ENCOUNTER — Encounter: Payer: Self-pay | Admitting: *Deleted

## 2013-12-23 ENCOUNTER — Ambulatory Visit: Payer: BC Managed Care – PPO

## 2013-12-23 ENCOUNTER — Other Ambulatory Visit: Payer: Self-pay | Admitting: Hematology and Oncology

## 2013-12-23 DIAGNOSIS — Z510 Encounter for antineoplastic radiation therapy: Secondary | ICD-10-CM | POA: Insufficient documentation

## 2013-12-23 DIAGNOSIS — C01 Malignant neoplasm of base of tongue: Secondary | ICD-10-CM

## 2013-12-23 MED ORDER — BIAFINE EX EMUL
Freq: Two times a day (BID) | CUTANEOUS | Status: DC
  Administered 2013-12-23: 11:00:00 via TOPICAL

## 2013-12-23 NOTE — Progress Notes (Signed)
[facility] End of Treatment Note  Name:[name] Wu  Date: 12/23/2013 SFK:[id number] DOB:February 11, 1955   Status:outpatient    CC: Imelda Pillow, NP  Dr. Heath Lark, Dr. Lucien Mons, Dr. Jorja Loa  REFERRING PHYSICIAN: Dr. 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    High risk nodal bed 5950 cGy in 35 sessions, and low risk nodal bed 5600 cGy in 35 sessions                      BEAMS/ENERGY: 6 MV photons, helical IMRT Tomotherapy                  NARRATIVE:  The patient tolerated his treatment reasonably well, although he had intermittent nausea/vomiting during his course of therapy. We had a delay in initiation of his treatment because of slow healing of his molar extraction sites. He received concomitant chemotherapy through Dr. Alvy Bimler. His blood counts remain satisfactory. As expected, he had symptomatic mucositis, primarily localized to his oropharynx which was treated with a Duragesic patch and also oxycodone when necessary. His weight remained recently stable with PEG tube feedings with Osmolite.                          PLAN: Routine followup in 2 weeks. Patient instructed to call if questions or worsening complaints in interim.

## 2013-12-23 NOTE — Progress Notes (Signed)
Weekly Management Note:  Site:oropharynx/neck Current Dose:  7000  cGy Projected Dose: 7000  cGy  Narrative: The patient is seen today for routine under treatment assessment. CBCT/MVCT images/port films were reviewed. The chart was reviewed.   He continues with his fentanyl 50 g patch and also oxycodone when necessary breakthrough pain. He uses Biafine along his neck where there is dry desquamation. He continues to take in 6 cans of Osmolite a day. His had some difficulty with constipation and takes senna twice a day. He believes that his weight is stable but we are unable to weigh him today because of our scale malfunction.  Physical Examination:  Filed Vitals:   12/23/13 1031  BP: 127/75  Pulse: 108  Temp: 99.2 F (37.3 C)  Resp: 20  .  Weight:  . There is hyperpigmentation and dry desquamation along the neck bilaterally. I cannot appreciate any neck adenopathy. On inspection the oropharynx there is no candidiasis. There is a confluent mucositis along his oropharynx as expected.  Impression: Radiation therapy completed. I encouraged him to increase his fluid intake.  Plan: Follow-up visit in 2 weeks.

## 2013-12-23 NOTE — Progress Notes (Signed)
Patient reports mild pain of tongue and throat. He is wearing Fentanyl patch 50 mcg, takes Oxycodone 10 mg every 4 hours during the day, 15 mg every night. He is applying Biafine to skin of neck treatment area for dry desquamation. He takes in 6 cans Osmolite daily. He has not had BM x 3 or more days. Taking Senna 5 ml twice a day. Advised he may need to take 10 ml tonight if no BM today. Pt using warm water with baking soda for thick saliva.  Pt completed today, Dr Valere Dross will decide if pt needs to FU sooner than 1 month.

## 2013-12-23 NOTE — Progress Notes (Signed)
Chart note: On 10/08/2013 the patient was taken to the CT simulator. A custom head cast was constructed for immobilization. He underwent complex CT simulation. The CT data set was sent to the planning system for contouring of his target structures and also avoidance structures.

## 2013-12-24 ENCOUNTER — Other Ambulatory Visit (HOSPITAL_BASED_OUTPATIENT_CLINIC_OR_DEPARTMENT_OTHER): Payer: BC Managed Care – PPO

## 2013-12-24 ENCOUNTER — Encounter: Payer: Self-pay | Admitting: Hematology and Oncology

## 2013-12-24 ENCOUNTER — Other Ambulatory Visit: Payer: Self-pay | Admitting: Hematology and Oncology

## 2013-12-24 ENCOUNTER — Telehealth: Payer: Self-pay | Admitting: Hematology and Oncology

## 2013-12-24 ENCOUNTER — Ambulatory Visit (HOSPITAL_BASED_OUTPATIENT_CLINIC_OR_DEPARTMENT_OTHER): Payer: BC Managed Care – PPO

## 2013-12-24 ENCOUNTER — Ambulatory Visit (HOSPITAL_BASED_OUTPATIENT_CLINIC_OR_DEPARTMENT_OTHER): Payer: BC Managed Care – PPO | Admitting: Hematology and Oncology

## 2013-12-24 ENCOUNTER — Ambulatory Visit: Payer: BC Managed Care – PPO

## 2013-12-24 VITALS — BP 114/80 | HR 89 | Temp 98.3°F | Resp 18 | Ht 65.0 in | Wt 199.2 lb

## 2013-12-24 DIAGNOSIS — Z931 Gastrostomy status: Secondary | ICD-10-CM

## 2013-12-24 DIAGNOSIS — Z9889 Other specified postprocedural states: Secondary | ICD-10-CM

## 2013-12-24 DIAGNOSIS — E46 Unspecified protein-calorie malnutrition: Secondary | ICD-10-CM

## 2013-12-24 DIAGNOSIS — D630 Anemia in neoplastic disease: Secondary | ICD-10-CM

## 2013-12-24 DIAGNOSIS — C01 Malignant neoplasm of base of tongue: Secondary | ICD-10-CM

## 2013-12-24 DIAGNOSIS — R110 Nausea: Secondary | ICD-10-CM

## 2013-12-24 DIAGNOSIS — R7989 Other specified abnormal findings of blood chemistry: Secondary | ICD-10-CM

## 2013-12-24 DIAGNOSIS — Z452 Encounter for adjustment and management of vascular access device: Secondary | ICD-10-CM

## 2013-12-24 DIAGNOSIS — R748 Abnormal levels of other serum enzymes: Secondary | ICD-10-CM

## 2013-12-24 DIAGNOSIS — R070 Pain in throat: Secondary | ICD-10-CM

## 2013-12-24 DIAGNOSIS — R058 Other specified cough: Secondary | ICD-10-CM

## 2013-12-24 DIAGNOSIS — Z95828 Presence of other vascular implants and grafts: Secondary | ICD-10-CM

## 2013-12-24 DIAGNOSIS — R05 Cough: Secondary | ICD-10-CM

## 2013-12-24 LAB — COMPREHENSIVE METABOLIC PANEL (CC13)
ALT: 15 U/L (ref 0–55)
AST: 17 U/L (ref 5–34)
Albumin: 3.3 g/dL — ABNORMAL LOW (ref 3.5–5.0)
Alkaline Phosphatase: 67 U/L (ref 40–150)
Anion Gap: 8 mEq/L (ref 3–11)
BUN: 19.4 mg/dL (ref 7.0–26.0)
CO2: 29 mEq/L (ref 22–29)
Calcium: 9.3 mg/dL (ref 8.4–10.4)
Chloride: 94 mEq/L — ABNORMAL LOW (ref 98–109)
Creatinine: 1.1 mg/dL (ref 0.7–1.3)
Glucose: 101 mg/dl (ref 70–140)
Potassium: 4.7 mEq/L (ref 3.5–5.1)
Sodium: 131 mEq/L — ABNORMAL LOW (ref 136–145)
Total Bilirubin: 0.4 mg/dL (ref 0.20–1.20)
Total Protein: 7 g/dL (ref 6.4–8.3)

## 2013-12-24 LAB — CBC WITH DIFFERENTIAL/PLATELET
BASO%: 0.4 % (ref 0.0–2.0)
Basophils Absolute: 0 10*3/uL (ref 0.0–0.1)
EOS%: 1.4 % (ref 0.0–7.0)
Eosinophils Absolute: 0.1 10*3/uL (ref 0.0–0.5)
HCT: 31.7 % — ABNORMAL LOW (ref 38.4–49.9)
HGB: 10.7 g/dL — ABNORMAL LOW (ref 13.0–17.1)
LYMPH%: 10.9 % — ABNORMAL LOW (ref 14.0–49.0)
MCH: 31.4 pg (ref 27.2–33.4)
MCHC: 33.8 g/dL (ref 32.0–36.0)
MCV: 93 fL (ref 79.3–98.0)
MONO#: 1.9 10*3/uL — ABNORMAL HIGH (ref 0.1–0.9)
MONO%: 27.2 % — ABNORMAL HIGH (ref 0.0–14.0)
NEUT#: 4.3 10*3/uL (ref 1.5–6.5)
NEUT%: 60.1 % (ref 39.0–75.0)
Platelets: 262 10*3/uL (ref 140–400)
RBC: 3.41 10*6/uL — ABNORMAL LOW (ref 4.20–5.82)
RDW: 15.7 % — ABNORMAL HIGH (ref 11.0–14.6)
WBC: 7.1 10*3/uL (ref 4.0–10.3)
lymph#: 0.8 10*3/uL — ABNORMAL LOW (ref 0.9–3.3)

## 2013-12-24 MED ORDER — SODIUM CHLORIDE 0.9 % IJ SOLN
10.0000 mL | INTRAMUSCULAR | Status: DC | PRN
  Administered 2013-12-24: 10 mL via INTRAVENOUS
  Filled 2013-12-24: qty 10

## 2013-12-24 MED ORDER — HEPARIN SOD (PORK) LOCK FLUSH 100 UNIT/ML IV SOLN
500.0000 [IU] | Freq: Once | INTRAVENOUS | Status: AC
  Administered 2013-12-24: 500 [IU] via INTRAVENOUS
  Filled 2013-12-24: qty 5

## 2013-12-24 MED ORDER — AMOXICILLIN 250 MG/5ML PO SUSR: 500.0000 mg | Freq: Two times a day (BID) | ORAL | Status: DC

## 2013-12-24 NOTE — Assessment & Plan Note (Signed)
The patient is at risk of infection. I recommend a course of amoxicillin.

## 2013-12-24 NOTE — Telephone Encounter (Signed)
s.w. pt wife and advised on NOV appt....ok and aware °

## 2013-12-24 NOTE — Assessment & Plan Note (Signed)
This is improving. I encouraged the patient to increase oral intake if tolerated.

## 2013-12-24 NOTE — Assessment & Plan Note (Signed)
I would recommend we continue on aggressive supportive care. He has completed both chemotherapy and radiation therapy. I will see him on a weekly basis for supportive care.

## 2013-12-24 NOTE — Assessment & Plan Note (Signed)
This is likely due to recent treatment. The patient denies recent history of bleeding such as epistaxis, hematuria or hematochezia. He is asymptomatic from the anemia. I will observe for now.    

## 2013-12-24 NOTE — Progress Notes (Signed)
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 There are right level 2 and level 3 adenopathy as described and right peritracheal lymph node within the superior mediastinum   09/12/2013 Surgery Laryngoscopy showed the oropharynx revealed a firm friable mass involving the lateral base of tongue and inferior tonsillar fossa.    09/13/2013 Pathology Results Accession: [id number] biopsy confirmed squamous cell carcinoma, HPV positive   09/25/2013 Imaging PET scan showed right base of tongue mass exhibits intense FDG uptake compatible with primary head neck carcinoma with associated hypermetabolic right level to adenopathy   10/03/2013 Procedure He had dental extraction   10/21/2013 - 12/06/2013 Chemotherapy He start on high dose cisplatin. Dose number 3 reduced 50% due to side-effects   11/04/2013 - 12/23/2013 Radiation Therapy He received radiation therapy   11/19/2013 Procedure He had placement of right PICC line and removal of port   12/16/2013 Procedure The balloon in the feeding tube ruptured and the feeding tube fell out. It was replaced    INTERVAL HISTORY: Please see below for problem oriented charting. He has completed all his treatment and fell a bit  better. His pain appeared to be under control. He had mild nausea but no vomiting. He has been getting productive cough of yellowish sputum recently.  REVIEW OF SYSTEMS:   Constitutional: Denies fevers, chills or abnormal weight loss Eyes: Denies blurriness of vision Cardiovascular: Denies palpitation, chest discomfort or lower extremity swelling Gastrointestinal:  Denies nausea, heartburn or change in bowel habits Skin: Denies abnormal skin rashes Lymphatics: Denies new lymphadenopathy or easy bruising Neurological:Denies numbness, tingling or new weaknesses Behavioral/Psych: Mood is stable, no new changes  All other systems were reviewed with the patient and are negative.  I have reviewed the past medical history, past surgical history, social history and family history with the patient and they are unchanged from previous note.  ALLERGIES:  is allergic to codeine and velosef.  MEDICATIONS:  Current Outpatient Prescriptions  Medication Sig Dispense Refill  . amoxicillin (AMOXIL) 250 MG/5ML suspension Place 10 mLs (500 mg total) into feeding tube 2 (two) times daily. 150 mL 0  . fentaNYL (DURAGESIC - DOSED MCG/HR) 25 MCG/HR patch   0  . fentaNYL (DURAGESIC - DOSED MCG/HR) 50 MCG/HR Place 1 patch (50 mcg total) onto the skin every 3 (three) days. 5 patch 0  . hyaluronate sodium (RADIAPLEXRX) GEL Apply 1 application topically 2 (two) times daily.    Marland Kitchen HYDROcodone-acetaminophen (NORCO) 7.5-325 MG per tablet Take 1-2 tablets by mouth every 4 (four) hours as needed for moderate pain. 50 tablet 0  . lidocaine-prilocaine (EMLA) cream Apply 1 application topically as needed (per port-a-cath).    . metoCLOPramide (REGLAN) 5 MG/5ML solution Take 10  mLs (10 mg total) by mouth 4 (four) times daily -  before meals and at bedtime. 480 mL 3  . Nutritional Supplements (FEEDING SUPPLEMENT, OSMOLITE 1.5 CAL,) LIQD Begin 1 can Osmolite 1.5 QID with 60 ml free water before and after bolus feeding.  Increase  to 1.5 cans QID on day 2.  Increase to goal of 1.5 cans BID and 2 cans BID day 3.  Flush with additional 240 cc water TID between feedings. Send formula please. 1659 mL 0  . ondansetron (ZOFRAN) 8 MG tablet Take 1 tablet (8 mg total) by mouth every 8 (eight) hours as needed for nausea or vomiting. 60 tablet 6  . oxyCODONE (ROXICODONE) 15 MG immediate release tablet Take 1 tablet (15 mg total) by mouth every 4 (four) hours as needed for severe pain. 60 tablet 0  . Oxycodone HCl 10 MG TABS Take 1 tablet (10 mg total) by mouth every 4 (four) hours as needed. 60 tablet 0  . prochlorperazine (COMPAZINE) 10 MG tablet Take 1 tablet (10 mg total) by mouth every 6 (six) hours as needed for nausea or vomiting. 60 tablet 3  . prochlorperazine (COMPAZINE) 10 MG tablet TAKE 1 TABLET BY MOUTH EVERY 6 HOURS AS NEEDED FOR NAUSEA OR VOMITING 60 tablet 1  . scopolamine (TRANSDERM-SCOP) 1 MG/3DAYS Place 1 patch (1.5 mg total) onto the skin every 3 (three) days. 10 patch 12  . sennosides (SENOKOT) 8.8 MG/5ML syrup Take 5 mLs by mouth 2 (two) times daily as needed for mild constipation. 240 mL 1  . Sodium Chloride Flush 0.9 % SOLN injection Inject 10 mLs into the vein daily. 100 Syringe 3  . sodium fluoride (FLUORISHIELD) 1.1 % GEL dental gel Place 1 application onto teeth at bedtime.     No current facility-administered medications for this visit.    PHYSICAL EXAMINATION: ECOG PERFORMANCE STATUS: 1 - Symptomatic but completely ambulatory  Filed Vitals:   12/24/13 1056  BP: 114/80  Pulse: 89  Temp: 98.3 F (36.8 C)  Resp: 18   Filed Weights   12/24/13 1056  Weight: 199 lb 3.2 oz (90.357 kg)    GENERAL:alert, no distress and comfortable SKIN: skin color, texture, turgor are normal, no rashes or significant lesions EYES: normal, Conjunctiva are pink and non-injected, sclera clear OROPHARYNX:no exudate, no erythema and lips, buccal mucosa, and tongue normal . He has difficulty opening his mouth. NECK:  supple, thyroid normal size, non-tender, without nodularity LYMPH:  no palpable lymphadenopathy in the cervical, axillary or inguinal LUNGS: clear to auscultation and percussion with normal breathing effort HEART: regular rate & rhythm and no murmurs and no lower extremity edema ABDOMEN:abdomen soft, non-tender and normal bowel sounds. Feeding tube site looks okay.  Musculoskeletal:no cyanosis of digits and no clubbing  NEURO: alert & oriented x 3 with fluent speech, no focal motor/sensory deficits  LABORATORY DATA:  I have reviewed the data as listed    Component Value Date/Time   NA 131* 12/24/2013 1029   NA 134* 11/27/2013 0813   K 4.7 12/24/2013 1029   K 4.1 11/27/2013 0813   CL 95* 11/27/2013 0813   CO2 29 12/24/2013 1029   CO2 28 11/27/2013 0813   GLUCOSE 101 12/24/2013 1029   GLUCOSE 105* 11/27/2013 0813   BUN 19.4 12/24/2013 1029   BUN 19 11/27/2013 0813   CREATININE 1.1 12/24/2013 1029   CREATININE 1.16 11/27/2013 0813   CALCIUM 9.3 12/24/2013 1029   CALCIUM 8.9 11/27/2013 0813   PROT 7.0 12/24/2013  1029   PROT 6.9 11/27/2013 0813   ALBUMIN 3.3* 12/24/2013 1029   ALBUMIN 3.2* 11/27/2013 0813   AST 17 12/24/2013 1029   AST 13 11/27/2013 0813   ALT 15 12/24/2013 1029   ALT 10 11/27/2013 0813   ALKPHOS 67 12/24/2013 1029   ALKPHOS 61 11/27/2013 0813   BILITOT 0.40 12/24/2013 1029   BILITOT 0.6 11/27/2013 0813   GFRNONAA 40* 11/19/2013 1105   GFRAA 47* 11/19/2013 1105    No results found for: SPEP, UPEP  Lab Results  Component Value Date   WBC 7.1 12/24/2013   NEUTROABS 4.3 12/24/2013   HGB 10.7* 12/24/2013   HCT 31.7* 12/24/2013   MCV 93.0 12/24/2013   PLT 262 12/24/2013      Chemistry      Component Value Date/Time   NA 131* 12/24/2013 1029   NA 134* 11/27/2013 0813   K 4.7 12/24/2013 1029   K 4.1 11/27/2013 0813   CL 95* 11/27/2013 0813   CO2 29 12/24/2013 1029   CO2 28 11/27/2013 0813   BUN 19.4 12/24/2013 1029   BUN 19 11/27/2013 0813    CREATININE 1.1 12/24/2013 1029   CREATININE 1.16 11/27/2013 0813      Component Value Date/Time   CALCIUM 9.3 12/24/2013 1029   CALCIUM 8.9 11/27/2013 0813   ALKPHOS 67 12/24/2013 1029   ALKPHOS 61 11/27/2013 0813   AST 17 12/24/2013 1029   AST 13 11/27/2013 0813   ALT 15 12/24/2013 1029   ALT 10 11/27/2013 0813   BILITOT 0.40 12/24/2013 1029   BILITOT 0.6 11/27/2013 0813      ASSESSMENT & PLAN:  Squamous cell carcinoma of the right base of tongue I would recommend we continue on aggressive supportive care. He has completed both chemotherapy and radiation therapy. I will see him on a weekly basis for supportive care.    Elevated serum creatinine This is due to recent cisplatin chemotherapy and dehydration, has improved slightly with IV fluids. The patient wants to try oral hydration through mouth and feeding tube and to skip IV fluids. I think it is reasonable. I will recheck it next week.      Nausea without vomiting This is stable. He will continue antiemetics as needed.  Protein calorie malnutrition This is improving. I encouraged the patient to increase oral intake if tolerated.    S/P gastrostomy The feeding tube site looks okay with no signs of infection.   S/P PICC central line placement Recommend PICC line care with flushes.  Throat pain in adult His throat pain is stable. He will continue fentanyl patch 50 mcg every 72 hours and use breakthrough oxycodone to 15 mg as needed.    Anemia in neoplastic disease This is likely due to recent treatment. The patient denies recent history of bleeding such as epistaxis, hematuria or hematochezia. He is asymptomatic from the anemia. I will observe for now.    Productive cough The patient is at risk of infection. I recommend a course of amoxicillin.   No orders of the defined types were placed in this encounter.   All questions were answered. The patient knows to call the clinic with any problems, questions  or concerns. No barriers to learning was detected. I spent 40 minutes counseling the patient face to face. The total time spent in the appointment was 60 minutes and more than 50% was on counseling and review of test results     [facility], [place], MD 12/24/2013 3:11 PM

## 2013-12-24 NOTE — Assessment & Plan Note (Signed)
His throat pain is stable. He will continue fentanyl patch 50 mcg every 72 hours and use breakthrough oxycodone to 15 mg as needed.

## 2013-12-24 NOTE — Assessment & Plan Note (Signed)
This is stable. He will continue antiemetics as needed.

## 2013-12-24 NOTE — Assessment & Plan Note (Signed)
Recommend PICC line care with flushes.

## 2013-12-24 NOTE — Assessment & Plan Note (Signed)
The feeding tube site looks okay with no signs of infection.

## 2013-12-24 NOTE — Patient Instructions (Signed)
PICC Home Guide A peripherally inserted central catheter (PICC) is a long, thin, flexible tube that is inserted into a vein in the upper arm. It is a form of intravenous (IV) access. It is considered to be a "central" line because the tip of the PICC ends in a large vein in your chest. This large vein is called the superior vena cava (SVC). The PICC tip ends in the SVC because there is a lot of blood flow in the SVC. This allows medicines and IV fluids to be quickly distributed throughout the body. The PICC is inserted using a sterile technique by a specially trained nurse or physician. After the PICC is inserted, a chest X-ray exam is done to be sure it is in the correct place.  A PICC may be placed for different reasons, such as:  To give medicines and liquid nutrition that can only be given through a central line. Examples are:  Certain antibiotic treatments.  Chemotherapy.  Total parenteral nutrition (TPN).  To take frequent blood samples.  To give IV fluids and blood products.  If there is difficulty placing a peripheral intravenous (PIV) catheter. If taken care of properly, a PICC can remain in place for several months. A PICC can also allow a person to go home from the hospital early. Medicine and PICC care can be managed at home by a family member or home health care team. WHAT PROBLEMS CAN HAPPEN WHEN I HAVE A PICC? Problems with a PICC can occasionally occur. These may include the following:  A blood clot (thrombus) forming in or at the tip of the PICC. This can cause the PICC to become clogged. A clot-dissolving medicine called tissue plasminogen activator (tPA) can be given through the PICC to help break up the clot.  Inflammation of the vein (phlebitis) in which the PICC is placed. Signs of inflammation may include redness, pain at the insertion site, red streaks, or being able to feel a "cord" in the vein where the PICC is located.  Infection in the PICC or at the insertion  site. Signs of infection may include fever, chills, redness, swelling, or pus drainage from the PICC insertion site.  PICC movement (malposition). The PICC tip may move from its original position due to excessive physical activity, forceful coughing, sneezing, or vomiting.  A break or cut in the PICC. It is important to not use scissors near the PICC.  Nerve or tendon irritation or injury during PICC insertion. WHAT SHOULD I KEEP IN MIND ABOUT ACTIVITIES WHEN I HAVE A PICC?  You may bend your arm and move it freely. If your PICC is near or at the bend of your elbow, avoid activity with repeated motion at the elbow.  Rest at home for the remainder of the day following PICC line insertion.  Avoid lifting heavy objects as instructed by your health care provider.  Avoid using a crutch with the arm on the same side as your PICC. You may need to use a walker. WHAT SHOULD I KNOW ABOUT MY PICC DRESSING?  Keep your PICC bandage (dressing) clean and dry to prevent infection.  Ask your health care provider when you may shower. Ask your health care provider to teach you how to wrap the PICC when you do take a shower.  Change the PICC dressing as instructed by your health care provider.  Change your PICC dressing if it becomes loose or wet. WHAT SHOULD I KNOW ABOUT PICC CARE?  Check the PICC insertion site   daily for leakage, redness, swelling, or pain.  Do not take a bath, swim, or use hot tubs when you have a PICC. Cover PICC line with clear plastic wrap and tape to keep it dry while showering.  Flush the PICC as directed by your health care provider. Let your health care provider know right away if the PICC is difficult to flush or does not flush. Do not use force to flush the PICC.  Do not use a syringe that is less than 10 mL to flush the PICC.  Never pull or tug on the PICC.  Avoid blood pressure checks on the arm with the PICC.  Keep your PICC identification card with you at all  times.  Do not take the PICC out yourself. Only a trained clinical professional should remove the PICC. SEEK IMMEDIATE MEDICAL CARE IF:  Your PICC is accidentally pulled all the way out. If this happens, cover the insertion site with a bandage or gauze dressing. Do not throw the PICC away. Your health care provider will need to inspect it.  Your PICC was tugged or pulled and has partially come out. Do not  push the PICC back in.  There is any type of drainage, redness, or swelling where the PICC enters the skin.  You cannot flush the PICC, it is difficult to flush, or the PICC leaks around the insertion site when it is flushed.  You hear a "flushing" sound when the PICC is flushed.  You have pain, discomfort, or numbness in your arm, shoulder, or jaw on the same side as the PICC.  You feel your heart "racing" or skipping beats.  You notice a hole or tear in the PICC.  You develop chills or a fever. MAKE SURE YOU:   Understand these instructions.  Will watch your condition.  Will get help right away if you are not doing well or get worse. Document Released: 07/31/2002 Document Revised: 06/10/2013 Document Reviewed: 10/01/2012 [employer] Patient Information 2015 [employer]. This information is not intended to replace advice given to you by your health care provider. Make sure you discuss any questions you have with your health care provider.  

## 2013-12-24 NOTE — Assessment & Plan Note (Signed)
This is due to recent cisplatin chemotherapy and dehydration, has improved slightly with IV fluids. The patient wants to try oral hydration through mouth and feeding tube and to skip IV fluids. I think it is reasonable. I will recheck it next week.

## 2013-12-27 ENCOUNTER — Other Ambulatory Visit: Payer: Self-pay | Admitting: *Deleted

## 2013-12-27 DIAGNOSIS — C01 Malignant neoplasm of base of tongue: Secondary | ICD-10-CM

## 2013-12-27 MED ORDER — AMOXICILLIN 250 MG/5ML PO SUSR: 500.0000 mg | Freq: Two times a day (BID) | ORAL | Status: DC

## 2013-12-31 ENCOUNTER — Ambulatory Visit (HOSPITAL_BASED_OUTPATIENT_CLINIC_OR_DEPARTMENT_OTHER): Payer: BC Managed Care – PPO

## 2013-12-31 ENCOUNTER — Ambulatory Visit: Payer: BC Managed Care – PPO

## 2013-12-31 ENCOUNTER — Ambulatory Visit (HOSPITAL_BASED_OUTPATIENT_CLINIC_OR_DEPARTMENT_OTHER): Payer: BC Managed Care – PPO | Admitting: Hematology and Oncology

## 2013-12-31 ENCOUNTER — Other Ambulatory Visit (HOSPITAL_BASED_OUTPATIENT_CLINIC_OR_DEPARTMENT_OTHER): Payer: BC Managed Care – PPO

## 2013-12-31 ENCOUNTER — Encounter: Payer: Self-pay | Admitting: *Deleted

## 2013-12-31 ENCOUNTER — Encounter: Payer: Self-pay | Admitting: Hematology and Oncology

## 2013-12-31 VITALS — BP 125/80 | HR 89 | Temp 98.6°F | Resp 18 | Ht 65.0 in | Wt 197.1 lb

## 2013-12-31 DIAGNOSIS — C01 Malignant neoplasm of base of tongue: Secondary | ICD-10-CM

## 2013-12-31 DIAGNOSIS — Z452 Encounter for adjustment and management of vascular access device: Secondary | ICD-10-CM

## 2013-12-31 DIAGNOSIS — R070 Pain in throat: Secondary | ICD-10-CM

## 2013-12-31 DIAGNOSIS — E46 Unspecified protein-calorie malnutrition: Secondary | ICD-10-CM

## 2013-12-31 DIAGNOSIS — Z95828 Presence of other vascular implants and grafts: Secondary | ICD-10-CM

## 2013-12-31 LAB — CBC WITH DIFFERENTIAL/PLATELET
BASO%: 0.6 % (ref 0.0–2.0)
Basophils Absolute: 0 10*3/uL (ref 0.0–0.1)
EOS%: 1.9 % (ref 0.0–7.0)
Eosinophils Absolute: 0.1 10*3/uL (ref 0.0–0.5)
HCT: 33.4 % — ABNORMAL LOW (ref 38.4–49.9)
HGB: 11.2 g/dL — ABNORMAL LOW (ref 13.0–17.1)
LYMPH%: 17.2 % (ref 14.0–49.0)
MCH: 31.8 pg (ref 27.2–33.4)
MCHC: 33.4 g/dL (ref 32.0–36.0)
MCV: 95.2 fL (ref 79.3–98.0)
MONO#: 1.2 10*3/uL — ABNORMAL HIGH (ref 0.1–0.9)
MONO%: 17.4 % — ABNORMAL HIGH (ref 0.0–14.0)
NEUT#: 4.2 10*3/uL (ref 1.5–6.5)
NEUT%: 62.9 % (ref 39.0–75.0)
Platelets: 314 10*3/uL (ref 140–400)
RBC: 3.51 10*6/uL — ABNORMAL LOW (ref 4.20–5.82)
RDW: 17.9 % — ABNORMAL HIGH (ref 11.0–14.6)
WBC: 6.6 10*3/uL (ref 4.0–10.3)
lymph#: 1.1 10*3/uL (ref 0.9–3.3)

## 2013-12-31 LAB — COMPREHENSIVE METABOLIC PANEL (CC13)
ALT: 14 U/L (ref 0–55)
AST: 16 U/L (ref 5–34)
Albumin: 3.5 g/dL (ref 3.5–5.0)
Alkaline Phosphatase: 76 U/L (ref 40–150)
Anion Gap: 12 mEq/L — ABNORMAL HIGH (ref 3–11)
BUN: 20.5 mg/dL (ref 7.0–26.0)
CO2: 26 mEq/L (ref 22–29)
Calcium: 9.8 mg/dL (ref 8.4–10.4)
Chloride: 96 mEq/L — ABNORMAL LOW (ref 98–109)
Creatinine: 1.3 mg/dL (ref 0.7–1.3)
Glucose: 109 mg/dl (ref 70–140)
Potassium: 4.3 mEq/L (ref 3.5–5.1)
Sodium: 134 mEq/L — ABNORMAL LOW (ref 136–145)
Total Bilirubin: 0.37 mg/dL (ref 0.20–1.20)
Total Protein: 7 g/dL (ref 6.4–8.3)

## 2013-12-31 MED ORDER — OXYCODONE HCL 10 MG PO TABS: 10.0000 mg | ORAL_TABLET | ORAL | Status: DC | PRN

## 2013-12-31 MED ORDER — HEPARIN SOD (PORK) LOCK FLUSH 100 UNIT/ML IV SOLN
500.0000 [IU] | Freq: Once | INTRAVENOUS | Status: AC
  Administered 2013-12-31: 500 [IU] via INTRAVENOUS
  Filled 2013-12-31: qty 5

## 2013-12-31 MED ORDER — SODIUM CHLORIDE 0.9 % IJ SOLN
10.0000 mL | INTRAMUSCULAR | Status: DC | PRN
  Administered 2013-12-31: 10 mL via INTRAVENOUS
  Filled 2013-12-31: qty 10

## 2013-12-31 NOTE — Progress Notes (Signed)
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 There are right level 2 and level 3 adenopathy as described and right peritracheal lymph node within the superior mediastinum   09/12/2013 Surgery Laryngoscopy showed the oropharynx revealed a firm friable mass involving the lateral base of tongue and inferior tonsillar fossa.    09/13/2013 Pathology Results Accession: [id number] biopsy confirmed squamous cell carcinoma, HPV positive   09/25/2013 Imaging PET scan showed right base of tongue mass exhibits intense FDG uptake compatible with primary head neck carcinoma with associated hypermetabolic right level to adenopathy   10/03/2013 Procedure He had dental extraction   10/21/2013 - 12/06/2013 Chemotherapy He start on high dose cisplatin. Dose number 3 reduced 50% due to side-effects   11/04/2013 - 12/23/2013 Radiation Therapy He received radiation therapy   11/19/2013 Procedure He had placement of right PICC line and removal of port   12/16/2013 Procedure The balloon in the feeding tube ruptured and the feeding tube fell out. It was replaced    INTERVAL HISTORY: Please see below for problem oriented charting. He is doing well. He had weaned off prescription fentanyl  patch. He is attempting to swallow food but has forceful gag reflex from mucous production from his throat  REVIEW OF SYSTEMS:   Constitutional: Denies fevers, chills or abnormal weight loss Eyes: Denies blurriness of vision Respiratory: Denies cough, dyspnea or wheezes Cardiovascular: Denies palpitation, chest discomfort or lower extremity swelling Gastrointestinal:  Denies nausea, heartburn or change in bowel habits Skin: Denies abnormal skin rashes Lymphatics: Denies new lymphadenopathy or easy bruising Neurological:Denies numbness, tingling or new weaknesses Behavioral/Psych: Mood is stable, no new changes  All other systems were reviewed with the patient and are negative.  I have reviewed the past medical history, past surgical history, social history and family history with the patient and they are unchanged from previous note.  ALLERGIES:  is allergic to codeine and velosef.  MEDICATIONS:  Current Outpatient Prescriptions  Medication Sig Dispense Refill  . amoxicillin (AMOXIL) 250 MG/5ML suspension Place 10 mLs (500 mg total) into feeding tube 2 (two) times daily. 100 mL 0  . fentaNYL (DURAGESIC - DOSED MCG/HR) 25 MCG/HR patch   0  . fentaNYL (DURAGESIC - DOSED MCG/HR) 50 MCG/HR Place 1 patch (50 mcg total) onto the skin every 3 (three) days. 5 patch 0  . hyaluronate sodium (RADIAPLEXRX) GEL Apply 1 application topically 2 (two) times daily.    Marland Kitchen HYDROcodone-acetaminophen (NORCO) 7.5-325 MG per tablet Take 1-2 tablets by mouth every 4 (four) hours as needed for moderate pain. 50 tablet 0  . lidocaine-prilocaine (EMLA) cream Apply 1 application topically as needed (per port-a-cath).    . metoCLOPramide (REGLAN) 5 MG/5ML solution Take 10 mLs (  10 mg total) by mouth 4 (four) times daily -  before meals and at bedtime. 480 mL 3  . Nutritional Supplements (FEEDING SUPPLEMENT, OSMOLITE 1.5 CAL,) LIQD Begin 1 can Osmolite 1.5 QID with 60 ml free water before and after bolus feeding.   Increase to 1.5 cans QID on day 2.  Increase to goal of 1.5 cans BID and 2 cans BID day 3.  Flush with additional 240 cc water TID between feedings. Send formula please. 1659 mL 0  . ondansetron (ZOFRAN) 8 MG tablet Take 1 tablet (8 mg total) by mouth every 8 (eight) hours as needed for nausea or vomiting. 60 tablet 6  . oxyCODONE (ROXICODONE) 15 MG immediate release tablet Take 1 tablet (15 mg total) by mouth every 4 (four) hours as needed for severe pain. 60 tablet 0  . Oxycodone HCl 10 MG TABS Take 1 tablet (10 mg total) by mouth every 4 (four) hours as needed. 60 tablet 0  . prochlorperazine (COMPAZINE) 10 MG tablet Take 1 tablet (10 mg total) by mouth every 6 (six) hours as needed for nausea or vomiting. 60 tablet 3  . prochlorperazine (COMPAZINE) 10 MG tablet TAKE 1 TABLET BY MOUTH EVERY 6 HOURS AS NEEDED FOR NAUSEA OR VOMITING 60 tablet 1  . scopolamine (TRANSDERM-SCOP) 1 MG/3DAYS Place 1 patch (1.5 mg total) onto the skin every 3 (three) days. 10 patch 12  . sennosides (SENOKOT) 8.8 MG/5ML syrup Take 5 mLs by mouth 2 (two) times daily as needed for mild constipation. 240 mL 1  . Sodium Chloride Flush 0.9 % SOLN injection Inject 10 mLs into the vein daily. 100 Syringe 3  . sodium fluoride (FLUORISHIELD) 1.1 % GEL dental gel Place 1 application onto teeth at bedtime.     No current facility-administered medications for this visit.    PHYSICAL EXAMINATION: ECOG PERFORMANCE STATUS: 1 - Symptomatic but completely ambulatory  Filed Vitals:   12/31/13 1201  BP: 125/80  Pulse: 89  Temp: 98.6 F (37 C)  Resp: 18   Filed Weights   12/31/13 1201  Weight: 197 lb 1.6 oz (89.404 kg)    GENERAL:alert, no distress and comfortable SKIN: skin color, texture, turgor are normal, no rashes or significant lesions EYES: normal, Conjunctiva are pink and non-injected, sclera clear OROPHARYNX:no exudate, no erythema and lips, buccal mucosa, and tongue normal  NECK: supple, thyroid normal size,  non-tender, without nodularity LYMPH:  no palpable lymphadenopathy in the cervical, axillary or inguinal LUNGS: clear to auscultation and percussion with normal breathing effort HEART: regular rate & rhythm and no murmurs and no lower extremity edema ABDOMEN:abdomen soft, non-tender and normal bowel sounds. Feeding tube site looks OK Musculoskeletal:no cyanosis of digits and no clubbing  NEURO: alert & oriented x 3 with fluent speech, no focal motor/sensory deficits  LABORATORY DATA:  I have reviewed the data as listed    Component Value Date/Time   NA 134* 12/31/2013 1126   NA 134* 11/27/2013 0813   K 4.3 12/31/2013 1126   K 4.1 11/27/2013 0813   CL 95* 11/27/2013 0813   CO2 26 12/31/2013 1126   CO2 28 11/27/2013 0813   GLUCOSE 109 12/31/2013 1126   GLUCOSE 105* 11/27/2013 0813   BUN 20.5 12/31/2013 1126   BUN 19 11/27/2013 0813   CREATININE 1.3 12/31/2013 1126   CREATININE 1.16 11/27/2013 0813   CALCIUM 9.8 12/31/2013 1126   CALCIUM 8.9 11/27/2013 0813   PROT 7.0 12/31/2013 1126   PROT 6.9 11/27/2013 0813  ALBUMIN 3.5 12/31/2013 1126   ALBUMIN 3.2* 11/27/2013 0813   AST 16 12/31/2013 1126   AST 13 11/27/2013 0813   ALT 14 12/31/2013 1126   ALT 10 11/27/2013 0813   ALKPHOS 76 12/31/2013 1126   ALKPHOS 61 11/27/2013 0813   BILITOT 0.37 12/31/2013 1126   BILITOT 0.6 11/27/2013 0813   GFRNONAA 40* 11/19/2013 1105   GFRAA 47* 11/19/2013 1105    No results found for: SPEP, UPEP  Lab Results  Component Value Date   WBC 6.6 12/31/2013   NEUTROABS 4.2 12/31/2013   HGB 11.2* 12/31/2013   HCT 33.4* 12/31/2013   MCV 95.2 12/31/2013   PLT 314 12/31/2013      Chemistry      Component Value Date/Time   NA 134* 12/31/2013 1126   NA 134* 11/27/2013 0813   K 4.3 12/31/2013 1126   K 4.1 11/27/2013 0813   CL 95* 11/27/2013 0813   CO2 26 12/31/2013 1126   CO2 28 11/27/2013 0813   BUN 20.5 12/31/2013 1126   BUN 19 11/27/2013 0813   CREATININE 1.3 12/31/2013 1126    CREATININE 1.16 11/27/2013 0813      Component Value Date/Time   CALCIUM 9.8 12/31/2013 1126   CALCIUM 8.9 11/27/2013 0813   ALKPHOS 76 12/31/2013 1126   ALKPHOS 61 11/27/2013 0813   AST 16 12/31/2013 1126   AST 13 11/27/2013 0813   ALT 14 12/31/2013 1126   ALT 10 11/27/2013 0813   BILITOT 0.37 12/31/2013 1126   BILITOT 0.6 11/27/2013 0813      ASSESSMENT & PLAN:  Squamous cell carcinoma of the right base of tongue I would recommend we continue on aggressive supportive care. He has completed both chemotherapy and radiation therapy. I will see him every other week for supportive care.  Throat pain in adult His throat pain is stable. He had weaned off fentanyl patch 50 mcg and is using breakthrough oxycodone 15 mg as needed.      S/P PICC central line placement This is no longer needed. We will get it removed today  Protein calorie malnutrition I encouraged oral intake as tolerated   Orders Placed This Encounter  Procedures  . CBC with Differential    Standing Status: Future     Number of Occurrences: 1     Standing Expiration Date: 12/31/2014  . Comprehensive metabolic panel (Cmet) - [facility]    Standing Status: Future     Number of Occurrences: 1     Standing Expiration Date: 12/31/2014   All questions were answered. The patient knows to call the clinic with any problems, questions or concerns. No barriers to learning was detected. I spent 25 minutes counseling the patient face to face. The total time spent in the appointment was 30 minutes and more than 50% was on counseling and review of test results     [facility], [place], MD 12/31/2013 9:29 PM

## 2013-12-31 NOTE — Assessment & Plan Note (Signed)
His throat pain is stable. He had weaned off fentanyl patch 50 mcg and is using breakthrough oxycodone 15 mg as needed.

## 2013-12-31 NOTE — Assessment & Plan Note (Addendum)
I would recommend we continue on aggressive supportive care. He has completed both chemotherapy and radiation therapy. I will see him every other week for supportive care.

## 2013-12-31 NOTE — Patient Instructions (Signed)
PICC Removal °A peripherally inserted central catheter (PICC) is a long, thin, flexible tube that a health care provider can insert into a vein in your upper arm. It is a type of IV. Having a PICC in place gives health care providers quick access to your veins. It is a good way to distribute medicines and fluids quickly throughout your body. °LET YOUR HEALTH CARE PROVIDER KNOW ABOUT: °· Any allergies you have. °· All medicines you are taking, including vitamins, herbs, eye drops, creams, and over-the-counter medicines. °· Previous problems you or members of your family have had with the use of anesthetics. °· Any blood disorders you have. °· Previous surgeries you have had. °· Medical conditions you have. °RISKS AND COMPLICATIONS °Generally, this is a safe procedure. However, as with any procedure, problems can occur. Possible problems include: °· Bleeding. °· Infection. °BEFORE THE PROCEDURE °You need an order from your health care provider to have your PICC removed. Only a health care provider trained in PICC removal should take it out. You may have your PICC removed in the hospital or in an outpatient setting. °PROCEDURE °Having a PICC removed is usually painless. Removal of the tape that holds the PICC in place may be the most uncomfortable part. Do not take out the PICC yourself. Only a trained clinical professional, such as a PICC nurse, should remove it. If your health care provider thinks your PICC is infected, the tip may be sent to the lab for testing. °After taking out your PICC, your health care provider may:  °· Hold gentle pressure on the exit site. °· Apply some antibiotic ointment. °· Place a small bandage over the insertion site. °AFTER THE PROCEDURE °You should be able to remove the bandage after 24 hours. Follow all your health care provider's instructions.  °· Keep the insertion site clean by washing it gently with soap and water. °· Do not pick or remove a scab. °· Avoid strenuous physical  activity for a day or two. °· Let your health care provider know if you develop redness, soreness, bleeding, swelling, or drainage from the insertion site. °· Let your health care provider know if you develop chills or fever. °Document Released: 07/14/2009 Document Revised: 01/29/2013 Document Reviewed: 11/16/2012 °ExitCare® Patient Information ©2015 [employer]. This information is not intended to replace advice given to you by your health care provider. Make sure you discuss any questions you have with your health care provider. ° °

## 2013-12-31 NOTE — Patient Instructions (Signed)
PICC Home Guide A peripherally inserted central catheter (PICC) is a long, thin, flexible tube that is inserted into a vein in the upper arm. It is a form of intravenous (IV) access. It is considered to be a "central" line because the tip of the PICC ends in a large vein in your chest. This large vein is called the superior vena cava (SVC). The PICC tip ends in the SVC because there is a lot of blood flow in the SVC. This allows medicines and IV fluids to be quickly distributed throughout the body. The PICC is inserted using a sterile technique by a specially trained nurse or physician. After the PICC is inserted, a chest X-ray exam is done to be sure it is in the correct place.  A PICC may be placed for different reasons, such as:  To give medicines and liquid nutrition that can only be given through a central line. Examples are:  Certain antibiotic treatments.  Chemotherapy.  Total parenteral nutrition (TPN).  To take frequent blood samples.  To give IV fluids and blood products.  If there is difficulty placing a peripheral intravenous (PIV) catheter. If taken care of properly, a PICC can remain in place for several months. A PICC can also allow a person to go home from the hospital early. Medicine and PICC care can be managed at home by a family member or home health care team. WHAT PROBLEMS CAN HAPPEN WHEN I HAVE A PICC? Problems with a PICC can occasionally occur. These may include the following:  A blood clot (thrombus) forming in or at the tip of the PICC. This can cause the PICC to become clogged. A clot-dissolving medicine called tissue plasminogen activator (tPA) can be given through the PICC to help break up the clot.  Inflammation of the vein (phlebitis) in which the PICC is placed. Signs of inflammation may include redness, pain at the insertion site, red streaks, or being able to feel a "cord" in the vein where the PICC is located.  Infection in the PICC or at the insertion  site. Signs of infection may include fever, chills, redness, swelling, or pus drainage from the PICC insertion site.  PICC movement (malposition). The PICC tip may move from its original position due to excessive physical activity, forceful coughing, sneezing, or vomiting.  A break or cut in the PICC. It is important to not use scissors near the PICC.  Nerve or tendon irritation or injury during PICC insertion. WHAT SHOULD I KEEP IN MIND ABOUT ACTIVITIES WHEN I HAVE A PICC?  You may bend your arm and move it freely. If your PICC is near or at the bend of your elbow, avoid activity with repeated motion at the elbow.  Rest at home for the remainder of the day following PICC line insertion.  Avoid lifting heavy objects as instructed by your health care provider.  Avoid using a crutch with the arm on the same side as your PICC. You may need to use a walker. WHAT SHOULD I KNOW ABOUT MY PICC DRESSING?  Keep your PICC bandage (dressing) clean and dry to prevent infection.  Ask your health care provider when you may shower. Ask your health care provider to teach you how to wrap the PICC when you do take a shower.  Change the PICC dressing as instructed by your health care provider.  Change your PICC dressing if it becomes loose or wet. WHAT SHOULD I KNOW ABOUT PICC CARE?  Check the PICC insertion site   daily for leakage, redness, swelling, or pain.  Do not take a bath, swim, or use hot tubs when you have a PICC. Cover PICC line with clear plastic wrap and tape to keep it dry while showering.  Flush the PICC as directed by your health care provider. Let your health care provider know right away if the PICC is difficult to flush or does not flush. Do not use force to flush the PICC.  Do not use a syringe that is less than 10 mL to flush the PICC.  Never pull or tug on the PICC.  Avoid blood pressure checks on the arm with the PICC.  Keep your PICC identification card with you at all  times.  Do not take the PICC out yourself. Only a trained clinical professional should remove the PICC. SEEK IMMEDIATE MEDICAL CARE IF:  Your PICC is accidentally pulled all the way out. If this happens, cover the insertion site with a bandage or gauze dressing. Do not throw the PICC away. Your health care provider will need to inspect it.  Your PICC was tugged or pulled and has partially come out. Do not  push the PICC back in.  There is any type of drainage, redness, or swelling where the PICC enters the skin.  You cannot flush the PICC, it is difficult to flush, or the PICC leaks around the insertion site when it is flushed.  You hear a "flushing" sound when the PICC is flushed.  You have pain, discomfort, or numbness in your arm, shoulder, or jaw on the same side as the PICC.  You feel your heart "racing" or skipping beats.  You notice a hole or tear in the PICC.  You develop chills or a fever. MAKE SURE YOU:   Understand these instructions.  Will watch your condition.  Will get help right away if you are not doing well or get worse. Document Released: 07/31/2002 Document Revised: 06/10/2013 Document Reviewed: 10/01/2012 [employer] Patient Information 2015 [employer]. This information is not intended to replace advice given to you by your health care provider. Make sure you discuss any questions you have with your health care provider.  

## 2013-12-31 NOTE — Assessment & Plan Note (Signed)
I encouraged oral intake as tolerated

## 2013-12-31 NOTE — Assessment & Plan Note (Signed)
This is no longer needed. We will get it removed today

## 2013-12-31 NOTE — Progress Notes (Signed)
PICC removal per MD order/Dr. Alvy Bimler.  Site unremarkable & pt tol. Procedure well.  No bleeding.  Length 41 cm & tip intact.  Vaseline gauze dressing applied with pressure dsg & coban.  Pt. To lay flat for 30". No c/o's. D/c to home, dsg d & i, instructed to leave dsg on 24-48 hours & apply more pressure if any bleeding & to report if it doesn't stop.

## 2014-01-01 ENCOUNTER — Telehealth: Payer: Self-pay | Admitting: Hematology and Oncology

## 2014-01-01 ENCOUNTER — Encounter: Payer: Self-pay | Admitting: Hematology and Oncology

## 2014-01-01 NOTE — Progress Notes (Signed)
To provide support and encouragement, care continuity and to assess for needs, met with patient and his wife after his appt with Dr. Alvy Bimler: 1. He reported he is doing well s/p final RT last week.  2. He provided FMLA forms for recertification.  I forwarded to [employer]. 3. PICC being removed momentarily. He and wife understand they can contact me with concerns/needs.  Gayleen Orem, RN, BSN, [facility] [place] [phone]

## 2014-01-01 NOTE — Telephone Encounter (Signed)
lvm for pt regarding to DEC appt....mailed pt appt sched/avs and letter °

## 2014-01-01 NOTE — Progress Notes (Signed)
Put fmla form on nurse's desk °

## 2014-01-03 ENCOUNTER — Encounter: Payer: Self-pay | Admitting: Hematology and Oncology

## 2014-01-03 NOTE — Progress Notes (Signed)
Put fmla form in registration desk °

## 2014-01-06 ENCOUNTER — Ambulatory Visit (HOSPITAL_BASED_OUTPATIENT_CLINIC_OR_DEPARTMENT_OTHER): Payer: BC Managed Care – PPO | Admitting: Hematology and Oncology

## 2014-01-06 ENCOUNTER — Encounter: Payer: Self-pay | Admitting: *Deleted

## 2014-01-06 ENCOUNTER — Telehealth: Payer: Self-pay | Admitting: *Deleted

## 2014-01-06 VITALS — BP 130/81 | HR 102 | Temp 98.0°F | Resp 18 | Ht 65.0 in | Wt 195.9 lb

## 2014-01-06 DIAGNOSIS — E46 Unspecified protein-calorie malnutrition: Secondary | ICD-10-CM

## 2014-01-06 DIAGNOSIS — C01 Malignant neoplasm of base of tongue: Secondary | ICD-10-CM

## 2014-01-06 DIAGNOSIS — K1231 Oral mucositis (ulcerative) due to antineoplastic therapy: Secondary | ICD-10-CM

## 2014-01-06 DIAGNOSIS — R110 Nausea: Secondary | ICD-10-CM

## 2014-01-06 MED ORDER — MAGIC MOUTHWASH W/LIDOCAINE: 5.0000 mL | Freq: Four times a day (QID) | ORAL | Status: DC

## 2014-01-06 NOTE — Progress Notes (Signed)
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 There are right level 2 and level 3 adenopathy as described and right peritracheal lymph node within the superior mediastinum   09/12/2013 Surgery Laryngoscopy showed the oropharynx revealed a firm friable mass involving the lateral base of tongue and inferior tonsillar fossa.    09/13/2013 Pathology Results Accession: [id number] biopsy confirmed squamous cell carcinoma, HPV positive   09/25/2013 Imaging PET scan showed right base of tongue mass exhibits intense FDG uptake compatible with primary head neck carcinoma with associated hypermetabolic right level to adenopathy   10/03/2013 Procedure He had dental extraction   10/21/2013 - 12/06/2013 Chemotherapy He start on high dose cisplatin. Dose number 3 reduced 50% due to side-effects   11/04/2013 - 12/23/2013 Radiation Therapy He received radiation therapy   11/19/2013 Procedure He had placement of right PICC line and removal of port   12/16/2013 Procedure The balloon in the feeding tube ruptured and the feeding tube fell out. It was replaced    INTERVAL HISTORY: Please see below for problem oriented charting. He is seen urgently today because of complaints of blister  and pain in his mouth. He is dependent on feeding tube for all nutritional intake and is afraid to eat because of pain.  REVIEW OF SYSTEMS:   Constitutional: Denies fevers, chills or abnormal weight loss Eyes: Denies blurriness of vision Respiratory: Denies cough, dyspnea or wheezes Cardiovascular: Denies palpitation, chest discomfort or lower extremity swelling Gastrointestinal:  Denies nausea, heartburn or change in bowel habits Skin: Denies abnormal skin rashes Lymphatics: Denies new lymphadenopathy or easy bruising Neurological:Denies numbness, tingling or new weaknesses Behavioral/Psych: Mood is stable, no new changes  All other systems were reviewed with the patient and are negative.  I have reviewed the past medical history, past surgical history, social history and family history with the patient and they are unchanged from previous note.  ALLERGIES:  is allergic to codeine and velosef.  MEDICATIONS:  Current Outpatient Prescriptions  Medication Sig Dispense Refill  . hyaluronate sodium (RADIAPLEXRX) GEL Apply 1 application topically 2 (two) times daily.    Marland Kitchen lidocaine-prilocaine (EMLA) cream Apply 1 application topically as needed (per port-a-cath).    . metoCLOPramide (REGLAN) 5 MG/5ML solution Take 10 mLs (10 mg total) by mouth 4 (four) times daily -  before meals and at bedtime. 480 mL 3  . Nutritional Supplements (FEEDING SUPPLEMENT, OSMOLITE 1.5 CAL,) LIQD Begin 1 can Osmolite 1.5 QID with 60 ml free water before and after bolus feeding.  Increase to 1.5 cans QID on day 2.  Increase to goal of 1.5 cans BID and 2 cans BID day 3.  Flush with additional 240 cc water TID between feedings. Send formula please.  1659 mL 0  . ondansetron (ZOFRAN) 8 MG tablet Take 1 tablet (8 mg total) by mouth every 8 (eight) hours as needed for nausea or vomiting. 60 tablet 6  . Oxycodone HCl 10 MG TABS Take 1 tablet (10 mg total) by mouth every 4 (four) hours as needed. 60 tablet 0  .  prochlorperazine (COMPAZINE) 10 MG tablet Take 1 tablet (10 mg total) by mouth every 6 (six) hours as needed for nausea or vomiting. 60 tablet 3  . prochlorperazine (COMPAZINE) 10 MG tablet TAKE 1 TABLET BY MOUTH EVERY 6 HOURS AS NEEDED FOR NAUSEA OR VOMITING 60 tablet 1  . sennosides (SENOKOT) 8.8 MG/5ML syrup Take 5 mLs by mouth 2 (two) times daily as needed for mild constipation. 240 mL 1  . Sodium Chloride Flush 0.9 % SOLN injection Inject 10 mLs into the vein daily. 100 Syringe 3  . sodium fluoride (FLUORISHIELD) 1.1 % GEL dental gel Place 1 application onto teeth at bedtime.    . Alum & Mag Hydroxide-Simeth (MAGIC MOUTHWASH W/LIDOCAINE) SOLN Take 5 mLs by mouth 4 (four) times daily. 240 mL 0   No current facility-administered medications for this visit.    PHYSICAL EXAMINATION: ECOG PERFORMANCE STATUS: 1 - Symptomatic but completely ambulatory  Filed Vitals:   01/06/14 1526  BP: 130/81  Pulse: 102  Temp: 98 F (36.7 C)  Resp: 18   Filed Weights   01/06/14 1526  Weight: 195 lb 14.4 oz (88.86 kg)    GENERAL:alert, no distress and comfortable SKIN: skin color, texture, turgor are normal, no rashes or significant lesions EYES: normal, Conjunctiva are pink and non-injected, sclera clear OROPHARYNX: Noted persistent mucositis. No thrush. NECK: supple, thyroid normal size, non-tender, without nodularity LYMPH:  no palpable lymphadenopathy in the cervical, axillary or inguinal LUNGS: clear to auscultation and percussion with normal breathing effort HEART: regular rate & rhythm and no murmurs and no lower extremity edema ABDOMEN:abdomen soft, non-tender and normal bowel sounds Musculoskeletal:no cyanosis of digits and no clubbing  NEURO: alert & oriented x 3 with fluent speech, no focal motor/sensory deficits  LABORATORY DATA:  I have reviewed the data as listed    Component Value Date/Time   NA 134* 12/31/2013 1126   NA 134* 11/27/2013 0813   K 4.3 12/31/2013 1126   K 4.1  11/27/2013 0813   CL 95* 11/27/2013 0813   CO2 26 12/31/2013 1126   CO2 28 11/27/2013 0813   GLUCOSE 109 12/31/2013 1126   GLUCOSE 105* 11/27/2013 0813   BUN 20.5 12/31/2013 1126   BUN 19 11/27/2013 0813   CREATININE 1.3 12/31/2013 1126   CREATININE 1.16 11/27/2013 0813   CALCIUM 9.8 12/31/2013 1126   CALCIUM 8.9 11/27/2013 0813   PROT 7.0 12/31/2013 1126   PROT 6.9 11/27/2013 0813   ALBUMIN 3.5 12/31/2013 1126   ALBUMIN 3.2* 11/27/2013 0813   AST 16 12/31/2013 1126   AST 13 11/27/2013 0813   ALT 14 12/31/2013 1126   ALT 10 11/27/2013 0813   ALKPHOS 76 12/31/2013 1126   ALKPHOS 61 11/27/2013 0813   BILITOT 0.37 12/31/2013 1126   BILITOT 0.6 11/27/2013 0813   GFRNONAA 40* 11/19/2013 1105   GFRAA 47* 11/19/2013 1105    No results found for: SPEP, UPEP  Lab Results  Component Value Date   WBC 6.6 12/31/2013   NEUTROABS 4.2 12/31/2013   HGB 11.2* 12/31/2013   HCT 33.4* 12/31/2013   MCV 95.2 12/31/2013   PLT 314 12/31/2013  Chemistry      Component Value Date/Time   NA 134* 12/31/2013 1126   NA 134* 11/27/2013 0813   K 4.3 12/31/2013 1126   K 4.1 11/27/2013 0813   CL 95* 11/27/2013 0813   CO2 26 12/31/2013 1126   CO2 28 11/27/2013 0813   BUN 20.5 12/31/2013 1126   BUN 19 11/27/2013 0813   CREATININE 1.3 12/31/2013 1126   CREATININE 1.16 11/27/2013 0813      Component Value Date/Time   CALCIUM 9.8 12/31/2013 1126   CALCIUM 8.9 11/27/2013 0813   ALKPHOS 76 12/31/2013 1126   ALKPHOS 61 11/27/2013 0813   AST 16 12/31/2013 1126   AST 13 11/27/2013 0813   ALT 14 12/31/2013 1126   ALT 10 11/27/2013 0813   BILITOT 0.37 12/31/2013 1126   BILITOT 0.6 11/27/2013 0813      ASSESSMENT & PLAN:  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 I will continue to see him on the weekly basis.  Nausea without vomiting This is stable. He will continue antiemetics as needed.  Protein calorie  malnutrition I encouraged oral intake as tolerated.  Mucositis due to antineoplastic therapy The complaints of mouth blisters/sores is consistent with mucositis. There is no evidence of thrush. I gave him a trial of Magic mouthwash and we will reassess next week.   No orders of the defined types were placed in this encounter.   All questions were answered. The patient knows to call the clinic with any problems, questions or concerns. No barriers to learning was detected. I spent 25 minutes counseling the patient face to face. The total time spent in the appointment was 30 minutes and more than 50% was on counseling and review of test results     [facility], [place], MD 01/06/2014 8:02 PM

## 2014-01-06 NOTE — Assessment & Plan Note (Signed)
He is doing well apart from persistent minor side effects. I will continue to see him on the weekly basis.

## 2014-01-06 NOTE — Assessment & Plan Note (Signed)
This is stable. He will continue antiemetics as needed.

## 2014-01-06 NOTE — Telephone Encounter (Signed)
-----   Message from Heath Lark, MD sent at 01/06/2014  9:39 AM EST ----- Regarding: RE: Roof of Mouth Pls add patient at 4 pm today, no labs ----- Message -----    From: Brooks Sailors, RN    Sent: 01/06/2014   9:15 AM      To: Cathlean Cower, RN, Tammi Quincy Simmonds, RN, # Subject: Roof of Mouth                                  Pt wife called this morning, reported [name] Wu has white patchy spots on roof of mouth that caused increasing discomfort (burning sensation) over weekend.  Sounds like thrush.  She is asking for follow-up. Thanks, [employer]

## 2014-01-06 NOTE — Assessment & Plan Note (Signed)
The complaints of mouth blisters/sores is consistent with mucositis. There is no evidence of thrush. I gave him a trial of Magic mouthwash and we will reassess next week.

## 2014-01-06 NOTE — Telephone Encounter (Signed)
Confirmed w/ pharmacy they received Rx for MMW and understand formula, they do not have any questions.  They will prepare it for pt.Marland Kitchen

## 2014-01-06 NOTE — Assessment & Plan Note (Signed)
I encouraged oral intake as tolerated.

## 2014-01-07 ENCOUNTER — Ambulatory Visit
Admission: RE | Admit: 2014-01-07 | Discharge: 2014-01-07 | Disposition: A | Discharge status: Home / Self Care | Payer: BC Managed Care – PPO | Source: Ambulatory Visit | Attending: Radiation Oncology | Admitting: Radiation Oncology

## 2014-01-07 ENCOUNTER — Encounter: Payer: Self-pay | Admitting: Radiation Oncology

## 2014-01-07 VITALS — BP 124/85 | HR 108 | Temp 98.3°F | Resp 20 | Wt 196.8 lb

## 2014-01-07 DIAGNOSIS — C01 Malignant neoplasm of base of tongue: Secondary | ICD-10-CM

## 2014-01-07 NOTE — Progress Notes (Signed)
CC: Dr. 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  As expected, he continues to have throat discomfort for which she takes Tylenol during the day and 10 mg of oxycodone at night.  He continues with his gargles and he takes in 6 cans of Osmolite daily through his PEG tube.  His weight is stable.  He sees Dr. Alvy Bimler and Dr. Enrique Sack next week.  Physical examination: Alert and oriented. Filed Vitals:   01/07/14 1105  BP: 124/85  Pulse: 108  Temp: 98.3 F (36.8 C)  Resp: 20   Nodes: I no longer feel any adenopathy within the right neck.  On inspection of the oral cavity and oropharynx, he continues to have a brisk gag reflex.  There are thickened secretions with no evidence of candidiasis.  I'm unable to fully evaluate his right oropharynx with a tongue blade or mirror secondary to his brisk gag reflex.  His oropharyngeal mucositis does appear to be improved.  No obvious masses are seen.  Impression: Satisfactory progress with improving radiation mucositis.  Once his mucositis settles down, he will need to be examined by fiber-optic exam through Dr. Constance Holster because of his brisk gag reflex.  I see that Dr. Alvy Bimler we'll set him up for a posttreatment PET scan 3-4 months following completion of chemoradiation.  Plan: Follow-up visit with me in 4-6 weeks.  He'll maintain his follow-up with Dr. Enrique Sack and Dr. Alvy Bimler.  He will see Dr. Izora Gala in the near future as well.

## 2014-01-07 NOTE — Progress Notes (Signed)
Met with pt and his wife during final RT to offer support and to celebrate end of radiation treatment.  I presented wife a Certificate of Recognition.  I explained that my role as navigator will continue for several more months and that I will be calling and/or joining them during follow-up visits.  Pt and his wife verbalized understanding, understand that they can contact me should needs/concerns arise.    Gayleen Orem, RN, BSN, [place] [facility] [place] [phone]

## 2014-01-07 NOTE — Progress Notes (Signed)
Patient states he continues to have pain in his throat "that comes and goes". He takes Tylenol during the day, Oxycodone 10 mg at night. He has good pain relief. He states he continues to use salt/soda water for thick saliva, but it has thinned. He takes in all nutrition per peg tube, 6 cans of Osmolite daily . He has tried pop sickles but states anything he has tried to eat burns his throat. He denies nausea. Patient applying Radiaplex to skin of neck, some dry desquamation.

## 2014-01-14 ENCOUNTER — Ambulatory Visit (HOSPITAL_COMMUNITY): Payer: Medicaid - Dental | Admitting: Dentistry

## 2014-01-14 ENCOUNTER — Encounter (HOSPITAL_COMMUNITY): Payer: Self-pay | Admitting: Dentistry

## 2014-01-14 VITALS — BP 117/72 | HR 108 | Temp 97.5°F | Wt 199.0 lb

## 2014-01-14 DIAGNOSIS — R432 Parageusia: Secondary | ICD-10-CM

## 2014-01-14 DIAGNOSIS — R1310 Dysphagia, unspecified: Secondary | ICD-10-CM

## 2014-01-14 DIAGNOSIS — K117 Disturbances of salivary secretion: Secondary | ICD-10-CM

## 2014-01-14 DIAGNOSIS — Z9221 Personal history of antineoplastic chemotherapy: Secondary | ICD-10-CM

## 2014-01-14 DIAGNOSIS — C01 Malignant neoplasm of base of tongue: Secondary | ICD-10-CM

## 2014-01-14 DIAGNOSIS — Z923 Personal history of irradiation: Secondary | ICD-10-CM

## 2014-01-14 DIAGNOSIS — R682 Dry mouth, unspecified: Secondary | ICD-10-CM

## 2014-01-14 NOTE — Patient Instructions (Addendum)
RECOMMENDATIONS: 1. Brush after meals and at bedtime.  Use fluoride at bedtime. 2. Use trismus exercises as directed. 3. Use Biotene Rinse or salt water/baking soda rinses. 4. Multiple sips of water as needed. 5. Return to Dr. Oneida Arenas as scheduled for oral exam and cleaning. Call if problems before then.  Lenn Cal, DDS

## 2014-01-14 NOTE — Progress Notes (Signed)
01/14/2014  Patient Name:   [name] Wu Date of Birth:   1956/01/01 Medical Record Number: [id number]  BP 117/72 mmHg  Pulse 108  Temp(Src) 97.5 F (36.4 C) (Oral)  Wt 199 lb (90.266 kg)  [name] presents for oral examination after chemoradiation therapy. Patient completed radiation treatments from 10/27/13 through 12/23/13. Patient had three cycles of chemotherapy.  REVIEW OF CHIEF COMPLAINTS:  DRY MOUTH: Yes HARD TO SWALLOW: Yes HURT TO SWALLOW: Yes TASTE CHANGES: Taste is returning. SORES IN MOUTH: No  TRISMUS: No problems. WEIGHT: 199 lbs down from initial 215.  HOME OH REGIMEN:  BRUSHING: Once a day only due to significant gagging. FLOSSING: Once a day. RINSING: Rinses with salt water and baking soda. FLUORIDE: Uses fluoride nightly but not on a regular basis at this time. Fluoride uses highly encouraged.  TRISMUS EXERCISES:  Maximum interincisal opening: 40 mm   DENTAL EXAM:  Oral Hygiene:(PLAQUE): Good oral hygiene. LOCATION OF MUCOSITIS: No mucositis noted. DESCRIPTION OF SALIVA: Incipient xerostomia. Thick saliva. ANY EXPOSED BONE: None noted. OTHER WATCHED AREAS: Previous lower right molar extraction site with exposed bone has now healed in completely and is covered by soft tissue. DX: Xerostomia, Dysgeusia, Dysphagia and Odynophagia  RECOMMENDATIONS: 1. Brush after meals and at bedtime.  Use fluoride at bedtime. 2. Use trismus exercises as directed. 3. Use Biotene Rinse or salt water/baking soda rinses. 4. Multiple sips of water as needed. 5. Return to Dr. Oneida Arenas as scheduled for oral exam and cleaning. Call if problems before then.  Lenn Cal, DDS

## 2014-01-15 ENCOUNTER — Telehealth: Payer: Self-pay | Admitting: Hematology and Oncology

## 2014-01-15 ENCOUNTER — Ambulatory Visit (HOSPITAL_BASED_OUTPATIENT_CLINIC_OR_DEPARTMENT_OTHER): Payer: BC Managed Care – PPO | Admitting: Hematology and Oncology

## 2014-01-15 VITALS — BP 130/86 | HR 101 | Temp 98.2°F | Resp 18 | Ht 65.0 in | Wt 196.0 lb

## 2014-01-15 DIAGNOSIS — K1231 Oral mucositis (ulcerative) due to antineoplastic therapy: Secondary | ICD-10-CM

## 2014-01-15 DIAGNOSIS — E46 Unspecified protein-calorie malnutrition: Secondary | ICD-10-CM

## 2014-01-15 DIAGNOSIS — Z931 Gastrostomy status: Secondary | ICD-10-CM

## 2014-01-15 DIAGNOSIS — C01 Malignant neoplasm of base of tongue: Secondary | ICD-10-CM

## 2014-01-15 NOTE — Assessment & Plan Note (Signed)
This is improving.There is no evidence of thrush.  he will continue taking pain medicine as needed.  

## 2014-01-15 NOTE — Telephone Encounter (Signed)
gv adn printed appt sched and avs for pt for DEc °

## 2014-01-15 NOTE — Progress Notes (Signed)
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 There are right level 2 and level 3 adenopathy as described and right peritracheal lymph node within the superior mediastinum   09/12/2013 Surgery Laryngoscopy showed the oropharynx revealed a firm friable mass involving the lateral base of tongue and inferior tonsillar fossa.    09/13/2013 Pathology Results Accession: [id number] biopsy confirmed squamous cell carcinoma, HPV positive   09/25/2013 Imaging PET scan showed right base of tongue mass exhibits intense FDG uptake compatible with primary head neck carcinoma with associated hypermetabolic right level to adenopathy   10/03/2013 Procedure He had dental extraction   10/21/2013 - 12/06/2013 Chemotherapy He start on high dose cisplatin. Dose number 3 reduced 50% due to side-effects   11/04/2013 - 12/23/2013 Radiation Therapy He received radiation therapy   11/19/2013 Procedure He had placement of right PICC line and removal of port   12/16/2013 Procedure The balloon in the feeding tube ruptured and the feeding tube fell out. It was replaced    INTERVAL HISTORY: Please see below for problem oriented charting.  he is seen as part of his supportive care visits. He is  doing well. He still had persistent sore throat but has weaned of most of his pain medication. He has not attempted to swallow food. He is able to open his mouth better.  denies recent nausea or vomiting.  REVIEW OF SYSTEMS:   Constitutional: Denies fevers, chills or abnormal weight loss Eyes: Denies blurriness of vision Ears, nose, mouth, throat, and face: Denies mucositis or sore throat Respiratory: Denies cough, dyspnea or wheezes Cardiovascular: Denies palpitation, chest discomfort or lower extremity swelling Gastrointestinal:  Denies nausea, heartburn or change in bowel habits Skin: Denies abnormal skin rashes Lymphatics: Denies new lymphadenopathy or easy bruising Neurological:Denies numbness, tingling or new weaknesses Behavioral/Psych: Mood is stable, no new changes  All other systems were reviewed with the patient and are negative.  I have reviewed the past medical history, past surgical history, social history and family history with the patient and they are unchanged from previous note.  ALLERGIES:  is allergic to codeine and velosef.  MEDICATIONS:  Current Outpatient Prescriptions  Medication Sig Dispense Refill  . acetaminophen (TYLENOL) 100 MG/ML solution Take 10 mg/kg by mouth every 4 (four) hours as needed for fever.    . Alum & Mag Hydroxide-Simeth (MAGIC MOUTHWASH W/LIDOCAINE) SOLN Take 5 mLs by mouth 4 (four) times daily. 240 mL 0  . hyaluronate sodium (RADIAPLEXRX) GEL Apply 1 application topically 2 (two) times daily.    Marland Kitchen lidocaine-prilocaine (EMLA) cream Apply 1 application topically as needed (per port-a-cath).    . Nutritional Supplements (FEEDING SUPPLEMENT, OSMOLITE 1.5 CAL,) LIQD Begin 1 can Osmolite 1.5 QID with 60  ml free water before and after bolus feeding.  Increase to 1.5 cans QID on day 2.  Increase to goal of 1.5 cans BID and 2 cans BID day 3.  Flush with additional 240 cc water TID between feedings. Send formula please. 1659 mL 0  . ondansetron  (ZOFRAN) 8 MG tablet Take 1 tablet (8 mg total) by mouth every 8 (eight) hours as needed for nausea or vomiting. 60 tablet 6  . Oxycodone HCl 10 MG TABS Take 1 tablet (10 mg total) by mouth every 4 (four) hours as needed. 60 tablet 0  . prochlorperazine (COMPAZINE) 10 MG tablet TAKE 1 TABLET BY MOUTH EVERY 6 HOURS AS NEEDED FOR NAUSEA OR VOMITING 60 tablet 1  . sennosides (SENOKOT) 8.8 MG/5ML syrup Take 5 mLs by mouth 2 (two) times daily as needed for mild constipation. 240 mL 1  . Sodium Chloride Flush 0.9 % SOLN injection Inject 10 mLs into the vein daily. 100 Syringe 3  . sodium fluoride (FLUORISHIELD) 1.1 % GEL dental gel Place 1 application onto teeth at bedtime.     No current facility-administered medications for this visit.    PHYSICAL EXAMINATION: ECOG PERFORMANCE STATUS: 0 - Asymptomatic  Filed Vitals:   01/15/14 1135  BP: 130/86  Pulse: 101  Temp: 98.2 F (36.8 C)  Resp: 18   Filed Weights   01/15/14 1135  Weight: 196 lb (88.905 kg)    GENERAL:alert, no distress and comfortable SKIN: skin color, texture, turgor are normal, no rashes or significant lesions EYES: normal, Conjunctiva are pink and non-injected, sclera clear OROPHARYNX:no exudate, no erythema and lips, buccal mucosa, and tongue normal  NECK: supple, thyroid normal size, non-tender, without nodularity LYMPH:  no palpable lymphadenopathy in the cervical, axillary or inguinal LUNGS: clear to auscultation and percussion with normal breathing effort HEART: regular rate & rhythm and no murmurs and no lower extremity edema ABDOMEN:abdomen soft, non-tender and normal bowel sounds. Feeding tube site looks okay Musculoskeletal:no cyanosis of digits and no clubbing  NEURO: alert & oriented x 3 with fluent speech, no focal motor/sensory deficits  LABORATORY DATA:  I have reviewed the data as listed    Component Value Date/Time   NA 134* 12/31/2013 1126   NA 134* 11/27/2013 0813   K 4.3 12/31/2013 1126   K 4.1  11/27/2013 0813   CL 95* 11/27/2013 0813   CO2 26 12/31/2013 1126   CO2 28 11/27/2013 0813   GLUCOSE 109 12/31/2013 1126   GLUCOSE 105* 11/27/2013 0813   BUN 20.5 12/31/2013 1126   BUN 19 11/27/2013 0813   CREATININE 1.3 12/31/2013 1126   CREATININE 1.16 11/27/2013 0813   CALCIUM 9.8 12/31/2013 1126   CALCIUM 8.9 11/27/2013 0813   PROT 7.0 12/31/2013 1126   PROT 6.9 11/27/2013 0813   ALBUMIN 3.5 12/31/2013 1126   ALBUMIN 3.2* 11/27/2013 0813   AST 16 12/31/2013 1126   AST 13 11/27/2013 0813   ALT 14 12/31/2013 1126   ALT 10 11/27/2013 0813   ALKPHOS 76 12/31/2013 1126   ALKPHOS 61 11/27/2013 0813   BILITOT 0.37 12/31/2013 1126   BILITOT 0.6 11/27/2013 0813   GFRNONAA 40* 11/19/2013 1105   GFRAA 47* 11/19/2013 1105    No results found for: SPEP, UPEP  Lab Results  Component Value Date   WBC 6.6 12/31/2013   NEUTROABS 4.2 12/31/2013   HGB 11.2* 12/31/2013   HCT 33.4* 12/31/2013   MCV 95.2 12/31/2013   PLT 314 12/31/2013  Chemistry      Component Value Date/Time   NA 134* 12/31/2013 1126   NA 134* 11/27/2013 0813   K 4.3 12/31/2013 1126   K 4.1 11/27/2013 0813   CL 95* 11/27/2013 0813   CO2 26 12/31/2013 1126   CO2 28 11/27/2013 0813   BUN 20.5 12/31/2013 1126   BUN 19 11/27/2013 0813   CREATININE 1.3 12/31/2013 1126   CREATININE 1.16 11/27/2013 0813      Component Value Date/Time   CALCIUM 9.8 12/31/2013 1126   CALCIUM 8.9 11/27/2013 0813   ALKPHOS 76 12/31/2013 1126   ALKPHOS 61 11/27/2013 0813   AST 16 12/31/2013 1126   AST 13 11/27/2013 0813   ALT 14 12/31/2013 1126   ALT 10 11/27/2013 0813   BILITOT 0.37 12/31/2013 1126   BILITOT 0.6 11/27/2013 0813     ASSESSMENT & PLAN:  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 I will space out his visits to 3 weeks. Overall, he is clinically improving    Mucositis due to antineoplastic therapy  This is improving.There is no  evidence of thrush.  he will continue taking pain medicine as needed..    Protein calorie malnutrition  He is doing well. I encouraged him to increase oral intake as tolerated. In the meantime he is dependent on feeding tube  S/P gastrostomy The feeding tube site looks okay with no signs of infection.    No orders of the defined types were placed in this encounter.   All questions were answered. The patient knows to call the clinic with any problems, questions or concerns. No barriers to learning was detected. I spent 25 minutes counseling the patient face to face. The total time spent in the appointment was 30 minutes and more than 50% was on counseling and review of test results     [facility], [place], MD 01/15/2014 10:04 PM

## 2014-01-15 NOTE — Assessment & Plan Note (Signed)
He is doing well. I encouraged him to increase oral intake as tolerated. In the meantime he is dependent on feeding tube

## 2014-01-15 NOTE — Assessment & Plan Note (Signed)
He is doing well apart from persistent minor side effects. I will space out his visits to 3 weeks. Overall, he is clinically improving

## 2014-01-15 NOTE — Assessment & Plan Note (Signed)
The feeding tube site looks okay with no signs of infection.

## 2014-02-03 ENCOUNTER — Encounter: Payer: Self-pay | Admitting: Hematology and Oncology

## 2014-02-03 ENCOUNTER — Telehealth: Payer: Self-pay | Admitting: Hematology and Oncology

## 2014-02-03 ENCOUNTER — Ambulatory Visit (HOSPITAL_BASED_OUTPATIENT_CLINIC_OR_DEPARTMENT_OTHER): Payer: BC Managed Care – PPO | Admitting: Hematology and Oncology

## 2014-02-03 VITALS — BP 124/82 | HR 96 | Temp 98.5°F | Resp 18 | Ht 65.0 in | Wt 199.4 lb

## 2014-02-03 DIAGNOSIS — Z931 Gastrostomy status: Secondary | ICD-10-CM

## 2014-02-03 DIAGNOSIS — J329 Chronic sinusitis, unspecified: Secondary | ICD-10-CM | POA: Insufficient documentation

## 2014-02-03 DIAGNOSIS — I890 Lymphedema, not elsewhere classified: Secondary | ICD-10-CM

## 2014-02-03 DIAGNOSIS — J328 Other chronic sinusitis: Secondary | ICD-10-CM

## 2014-02-03 DIAGNOSIS — C01 Malignant neoplasm of base of tongue: Secondary | ICD-10-CM

## 2014-02-03 DIAGNOSIS — J0100 Acute maxillary sinusitis, unspecified: Secondary | ICD-10-CM

## 2014-02-03 DIAGNOSIS — R070 Pain in throat: Secondary | ICD-10-CM

## 2014-02-03 DIAGNOSIS — K1231 Oral mucositis (ulcerative) due to antineoplastic therapy: Secondary | ICD-10-CM

## 2014-02-03 DIAGNOSIS — B9689 Other specified bacterial agents as the cause of diseases classified elsewhere: Secondary | ICD-10-CM

## 2014-02-03 HISTORY — DX: Chronic sinusitis, unspecified: J32.9

## 2014-02-03 MED ORDER — LEVOFLOXACIN 500 MG PO TABS: 500.0000 mg | ORAL_TABLET | Freq: Every day | ORAL | Status: DC

## 2014-02-03 MED ORDER — OXYCODONE HCL 10 MG PO TABS: 10.0000 mg | ORAL_TABLET | ORAL | Status: DC | PRN

## 2014-02-03 NOTE — Assessment & Plan Note (Signed)
The feeding tube site looks okay with no signs of infection. He is dependent on feeding tube for 100% of caloric intake. He was not able to tolerate oral intake recently due to new sore throat from infection. I will proceed to treat his throat infection but recommend he increase oral fluid intake as tolerated

## 2014-02-03 NOTE — Progress Notes (Signed)
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 There are right level 2 and level 3 adenopathy as described and right peritracheal lymph node within the superior mediastinum   09/12/2013 Surgery Laryngoscopy showed the oropharynx revealed a firm friable mass involving the lateral base of tongue and inferior tonsillar fossa.    09/13/2013 Pathology Results Accession: [id number] biopsy confirmed squamous cell carcinoma, HPV positive   09/25/2013 Imaging PET scan showed right base of tongue mass exhibits intense FDG uptake compatible with primary head neck carcinoma with associated hypermetabolic right level to adenopathy   10/03/2013 Procedure He had dental extraction   10/21/2013 - 12/06/2013 Chemotherapy He start on high dose cisplatin. Dose number 3 reduced 50% due to side-effects   11/04/2013 - 12/23/2013 Radiation Therapy He received radiation therapy   11/19/2013 Procedure He had placement of right PICC line and removal of port   12/16/2013 Procedure The balloon in the feeding tube ruptured and the feeding tube fell out. It was replaced    INTERVAL HISTORY: Please see below for problem oriented charting. He is seen as part of his supportive care visit. He is not  able to eat anything by mouth over the past week due to new onset of sinus pressure, nasal drainage and sore throat. He is dependent on his feeding tube for his caloric intake. He has not lost any weight. His pain appears to be well controlled with Tylenol as needed and oxycodone at nighttime.  REVIEW OF SYSTEMS:   Constitutional: Denies fevers, chills or abnormal weight loss Eyes: Denies blurriness of vision Ears, nose, mouth, throat, and face: Denies mucositis  Respiratory: Denies cough, dyspnea or wheezes Cardiovascular: Denies palpitation, chest discomfort or lower extremity swelling Gastrointestinal:  Denies nausea, heartburn or change in bowel habits Skin: Denies abnormal skin rashes Lymphatics: Denies new lymphadenopathy or easy bruising Neurological:Denies numbness, tingling or new weaknesses Behavioral/Psych: Mood is stable, no new changes  All other systems were reviewed with the patient and are negative.  I have reviewed the past medical history, past surgical history, social history and family history with the patient and they are unchanged from previous note.  ALLERGIES:  is allergic to codeine and velosef.  MEDICATIONS:  Current Outpatient Prescriptions  Medication Sig Dispense Refill  . acetaminophen (TYLENOL) 100 MG/ML solution Take 10 mg/kg by mouth every 4 (four) hours as needed for fever.    . Alum & Mag Hydroxide-Simeth (MAGIC MOUTHWASH W/LIDOCAINE) SOLN Take 5 mLs by mouth 4 (four) times daily. 240 mL 0  . Alum & Mag Hydroxide-Simeth (MAGIC MOUTHWASH W/LIDOCAINE) SOLN   0  . hyaluronate sodium (RADIAPLEXRX) GEL Apply 1 application topically 2 (two) times daily.    Marland Kitchen lidocaine-prilocaine (EMLA) cream Apply  1 application topically as needed (per port-a-cath).    . Nutritional Supplements (FEEDING SUPPLEMENT, OSMOLITE 1.5 CAL,) LIQD Begin 1 can Osmolite 1.5 QID with 60 ml free water before and after bolus feeding.  Increase to 1.5 cans QID on day 2.  Increase to goal of  1.5 cans BID and 2 cans BID day 3.  Flush with additional 240 cc water TID between feedings. Send formula please. 1659 mL 0  . ondansetron (ZOFRAN) 8 MG tablet Take 1 tablet (8 mg total) by mouth every 8 (eight) hours as needed for nausea or vomiting. 60 tablet 6  . Oxycodone HCl 10 MG TABS Take 1 tablet (10 mg total) by mouth every 4 (four) hours as needed. 90 tablet 0  . prochlorperazine (COMPAZINE) 10 MG tablet TAKE 1 TABLET BY MOUTH EVERY 6 HOURS AS NEEDED FOR NAUSEA OR VOMITING 60 tablet 1  . sennosides (SENOKOT) 8.8 MG/5ML syrup Take 5 mLs by mouth 2 (two) times daily as needed for mild constipation. 240 mL 1  . Sodium Chloride Flush 0.9 % SOLN injection Inject 10 mLs into the vein daily. 100 Syringe 3  . sodium fluoride (FLUORISHIELD) 1.1 % GEL dental gel Place 1 application onto teeth at bedtime.    Marland Kitchen levofloxacin (LEVAQUIN) 500 MG tablet Take 1 tablet (500 mg total) by mouth daily. 7 tablet 0   No current facility-administered medications for this visit.    PHYSICAL EXAMINATION: ECOG PERFORMANCE STATUS: 1 - Symptomatic but completely ambulatory  Filed Vitals:   02/03/14 1147  BP: 124/82  Pulse: 96  Temp: 98.5 F (36.9 C)  Resp: 18   Filed Weights   02/03/14 1147  Weight: 199 lb 6.4 oz (90.447 kg)    GENERAL:alert, no distress and comfortable SKIN: skin color, texture, turgor are normal, no rashes or significant lesions EYES: normal, Conjunctiva are pink and non-injected, sclera clear OROPHARYNX: He appears to have difficulties opening his mouth wide. No thrush. NECK: Lymphedema is noted around his neck.  LYMPH:  no palpable lymphadenopathy in the cervical, axillary or inguinal LUNGS: clear to auscultation and percussion with normal breathing effort HEART: regular rate & rhythm and no murmurs and no lower extremity edema ABDOMEN:abdomen soft, non-tender and normal bowel sounds. feeding tube site looks okay Musculoskeletal:no cyanosis of digits and no clubbing  NEURO:  alert & oriented x 3 with fluent speech, no focal motor/sensory deficits  LABORATORY DATA:  I have reviewed the data as listed    Component Value Date/Time   NA 134* 12/31/2013 1126   NA 134* 11/27/2013 0813   K 4.3 12/31/2013 1126   K 4.1 11/27/2013 0813   CL 95* 11/27/2013 0813   CO2 26 12/31/2013 1126   CO2 28 11/27/2013 0813   GLUCOSE 109 12/31/2013 1126   GLUCOSE 105* 11/27/2013 0813   BUN 20.5 12/31/2013 1126   BUN 19 11/27/2013 0813   CREATININE 1.3 12/31/2013 1126   CREATININE 1.16 11/27/2013 0813   CALCIUM 9.8 12/31/2013 1126   CALCIUM 8.9 11/27/2013 0813   PROT 7.0 12/31/2013 1126   PROT 6.9 11/27/2013 0813   ALBUMIN 3.5 12/31/2013 1126   ALBUMIN 3.2* 11/27/2013 0813   AST 16 12/31/2013 1126   AST 13 11/27/2013 0813   ALT 14 12/31/2013 1126   ALT 10 11/27/2013 0813   ALKPHOS 76 12/31/2013 1126   ALKPHOS 61 11/27/2013 0813   BILITOT 0.37 12/31/2013 1126   BILITOT 0.6 11/27/2013 0813   GFRNONAA 40* 11/19/2013 1105   GFRAA 47* 11/19/2013  1105    No results found for: SPEP, UPEP  Lab Results  Component Value Date   WBC 6.6 12/31/2013   NEUTROABS 4.2 12/31/2013   HGB 11.2* 12/31/2013   HCT 33.4* 12/31/2013   MCV 95.2 12/31/2013   PLT 314 12/31/2013      Chemistry      Component Value Date/Time   NA 134* 12/31/2013 1126   NA 134* 11/27/2013 0813   K 4.3 12/31/2013 1126   K 4.1 11/27/2013 0813   CL 95* 11/27/2013 0813   CO2 26 12/31/2013 1126   CO2 28 11/27/2013 0813   BUN 20.5 12/31/2013 1126   BUN 19 11/27/2013 0813   CREATININE 1.3 12/31/2013 1126   CREATININE 1.16 11/27/2013 0813      Component Value Date/Time   CALCIUM 9.8 12/31/2013 1126   CALCIUM 8.9 11/27/2013 0813   ALKPHOS 76 12/31/2013 1126   ALKPHOS 61 11/27/2013 0813   AST 16 12/31/2013 1126   AST 13 11/27/2013 0813   ALT 14 12/31/2013 1126   ALT 10 11/27/2013 0813   BILITOT 0.37 12/31/2013 1126   BILITOT 0.6 11/27/2013 0813      ASSESSMENT & PLAN:  Squamous cell carcinoma  of the right base of tongue He is doing well apart from persistent minor side effects. I will space out his visits to 4weeks. Overall, he is clinically improving  Mucositis due to antineoplastic therapy  This is improving.There is no evidence of thrush.  he will continue taking pain medicine as needed.   S/P gastrostomy The feeding tube site looks okay with no signs of infection. He is dependent on feeding tube for 100% of caloric intake. He was not able to tolerate oral intake recently due to new sore throat from infection. 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 I recommend he increase exercises around his neck to reduce chronic lymphedema  Throat pain in adult This is recurrent, due to nasal drainage and sinus infection. I refilled his prescription pain medicine but recommended a course of antibiotic therapy  Bacterial sinusitis I recommend he use nasal spray to reduce drainage. I will give him a prescription course of antibiotic to treat this.   No orders of the defined types were placed in this encounter.   All questions were answered. The patient knows to call the clinic with any problems, questions or concerns. No barriers to learning was detected. I spent 30 minutes counseling the patient face to face. The total time spent in the appointment was 40 minutes and more than 50% was on counseling and review of test results     [facility], [place], MD 02/03/2014 10:05 PM

## 2014-02-03 NOTE — Assessment & Plan Note (Signed)
This is new, related to recent radiation. I recommend he increase exercises around his neck to reduce chronic lymphedema

## 2014-02-03 NOTE — Assessment & Plan Note (Signed)
This is improving.There is no evidence of thrush.  he will continue taking pain medicine as needed.

## 2014-02-03 NOTE — Assessment & Plan Note (Signed)
I recommend he use nasal spray to reduce drainage. I will give him a prescription course of antibiotic to treat this.

## 2014-02-03 NOTE — Telephone Encounter (Signed)
LM to confirm appt d/t for Jan. Mailed cal.

## 2014-02-03 NOTE — Assessment & Plan Note (Signed)
This is recurrent, due to nasal drainage and sinus infection. I refilled his prescription pain medicine but recommended a course of antibiotic therapy

## 2014-02-03 NOTE — Assessment & Plan Note (Addendum)
He is doing well apart from persistent minor side effects. I will space out his visits to 4weeks. Overall, he is clinically improving

## 2014-02-04 ENCOUNTER — Ambulatory Visit
Admission: RE | Admit: 2014-02-04 | Discharge: 2014-02-04 | Disposition: A | Discharge status: Home / Self Care | Payer: BC Managed Care – PPO | Source: Ambulatory Visit | Attending: Radiation Oncology | Admitting: Radiation Oncology

## 2014-02-04 ENCOUNTER — Encounter: Payer: Self-pay | Admitting: Radiation Oncology

## 2014-02-04 VITALS — BP 127/81 | HR 95 | Temp 98.7°F | Ht 65.0 in | Wt 200.4 lb

## 2014-02-04 DIAGNOSIS — C01 Malignant neoplasm of base of tongue: Secondary | ICD-10-CM

## 2014-02-04 NOTE — Progress Notes (Addendum)
[name] Wu here for reassessment s/p radiation therapy for cancer of the oropharynx.  He c/o level 3/10 soreness of throat with grimacing when he swallows.  He continues to obtain all nutrition via his PEG tube with Osmolite, 6 cans daily with 72 oz of water.  He has gained 4 lbs since 01/15/14.  His oral mucosa is moist with thickened saliva.  Unable to visualize back of throat due to elevation of posterior tongue and his propensity to gag with a tongue blade .  Tongue with whitish coating.  Skin on neck with mild hyperpigmentation and redness, but remains intact.

## 2014-02-04 NOTE — Progress Notes (Signed)
CC: Dr. Arville Care, Dr. Jorja Loa  Follow-up note:  Mr. 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  He continues to take Tylenol during the day and oxycodone at night.  He was seen by Dr. Alvy Bimler yesterday and was started on Levaquin for a recent sinus infection.  He continues to take in 6 cans of Osmolite a day with 72 ounces of water.  His weight is up 4 pounds over the past 2 weeks.  Physical examination: Alert and oriented. Filed Vitals:   02/04/14 1147  BP: 127/81  Pulse: 95  Temp: 98.7 F (37.1 C)   Nodes: There is no palpable lymphadenopathy in the neck.  On inspection of the oral cavity and oropharynx he has thickened secretions.  I did not attempt to examine him with a tongue blade or mirror secondary to a brisk gag reflex.  Impression: Satisfactory progress.  We discussed the follow-up with a PET scan at least 3 months following completion of chemoradiation.  He will see Dr. Alvy Bimler in one month and I will see him for a follow-up visit in one month.  I feel that Dr. Constance Holster can resume follow-up with him in 2 months.  We will rely on Dr. Constance Holster and his fiberoptic examination for examination of his oropharynx.  Plan: As above.

## 2014-03-04 ENCOUNTER — Ambulatory Visit
Admission: RE | Admit: 2014-03-04 | Discharge: 2014-03-04 | Disposition: A | Discharge status: Home / Self Care | Source: Ambulatory Visit | Attending: Radiation Oncology | Admitting: Radiation Oncology

## 2014-03-04 ENCOUNTER — Encounter: Payer: Self-pay | Admitting: Radiation Oncology

## 2014-03-04 VITALS — BP 106/71 | HR 93 | Temp 98.4°F | Ht 65.0 in | Wt 195.4 lb

## 2014-03-04 DIAGNOSIS — C01 Malignant neoplasm of base of tongue: Secondary | ICD-10-CM

## 2014-03-04 NOTE — Progress Notes (Addendum)
CC: Dr. Lucien Mons, Dr. Jorja Loa, Dr. Heath Lark  Follow-up note:  Mr. 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  He is off all his narcotics and takes Tylenol and/or ibuprofen when necessary discomfort.  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  He was given a prescription for Levaquin by Dr. Alvy Bimler for a recent sinus infection.  He is advancing his diet to soft foods, and he does continue with his PEG tube feedings, 6 cans of Osmolite a day.  His weight is down 4 pounds over the past month.  He will see Dr. Alvy Bimler for a follow-up visit later this week.  Physical examination: Alert and oriented. Filed Vitals:   03/04/14 1011  BP: 106/71  Pulse: 93  Temp: 98.4 F (36.9 C)   He looks much improved.  Nodes: There is no palpable lymphadenopathy in the neck.  Oral cavity and oropharynx unremarkable to direct visualization/ inspection with the exception of mild xerostomia.  His oropharyngeal mucositis has resolved.  I'm unable to perform an indirect mirror examination secondary to a brisk gag reflex.  Impression: Satisfactory progress.  I expect that Dr. Alvy Bimler will get him scheduled for a PET scan in approximately one month.  Follow-up visit with me in one month.  We will get Dr. Constance Holster to perform fiberoptic endoscopy after his PET scan.  Plan: As above.

## 2014-03-04 NOTE — Progress Notes (Addendum)
[name] Wu here today for reassessment s/p radiation to his oropharynx.  He continues to use PEG with Osmolite, 6 cans daily and is now eating pudding, jello, applesauce.  He reports less dysgeusia and also a decrease in thickened saliva which is relieved by taking Benadryl.  Levaquin for recent sore throat which c/o 3 weeks ago due to sore throat.  Oral mucosa is clear.

## 2014-03-06 ENCOUNTER — Telehealth: Payer: Self-pay | Admitting: Hematology and Oncology

## 2014-03-06 ENCOUNTER — Encounter: Payer: Self-pay | Admitting: *Deleted

## 2014-03-06 ENCOUNTER — Ambulatory Visit (HOSPITAL_BASED_OUTPATIENT_CLINIC_OR_DEPARTMENT_OTHER): Payer: BLUE CROSS/BLUE SHIELD | Admitting: Hematology and Oncology

## 2014-03-06 ENCOUNTER — Encounter: Payer: Self-pay | Admitting: Hematology and Oncology

## 2014-03-06 VITALS — BP 114/74 | HR 88 | Temp 97.5°F | Resp 18 | Ht 65.0 in | Wt 195.3 lb

## 2014-03-06 DIAGNOSIS — C01 Malignant neoplasm of base of tongue: Secondary | ICD-10-CM | POA: Diagnosis not present

## 2014-03-06 DIAGNOSIS — Z931 Gastrostomy status: Secondary | ICD-10-CM

## 2014-03-06 DIAGNOSIS — R1310 Dysphagia, unspecified: Secondary | ICD-10-CM | POA: Diagnosis not present

## 2014-03-06 DIAGNOSIS — I890 Lymphedema, not elsewhere classified: Secondary | ICD-10-CM

## 2014-03-06 NOTE — Assessment & Plan Note (Signed)
This is new, related to recent radiation. I recommend he increase exercises around his neck to reduce chronic lymphedema

## 2014-03-06 NOTE — Telephone Encounter (Signed)
Gave avs & calendar for March. °

## 2014-03-06 NOTE — Progress Notes (Signed)
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 There are right level 2 and level 3 adenopathy as described and right peritracheal lymph node within the superior mediastinum   09/12/2013 Surgery Laryngoscopy showed the oropharynx revealed a firm friable mass involving the lateral base of tongue and inferior tonsillar fossa.    09/13/2013 Pathology Results Accession: [id number] biopsy confirmed squamous cell carcinoma, HPV positive   09/25/2013 Imaging PET scan showed right base of tongue mass exhibits intense FDG uptake compatible with primary head neck carcinoma with associated hypermetabolic right level to adenopathy   10/03/2013 Procedure He had dental extraction   10/21/2013 - 12/06/2013 Chemotherapy He start on high dose cisplatin. Dose number 3 reduced 50% due to side-effects   11/04/2013 - 12/23/2013 Radiation Therapy He received radiation therapy   11/19/2013 Procedure He had placement of right PICC line and removal of port   12/16/2013 Procedure The balloon in the feeding tube ruptured and the feeding tube fell out. It was replaced    INTERVAL HISTORY: Please see below for problem oriented charting. He is seen for supportive care. His recent throat infection  has resolved. He has very mild throat pain. He is attempting to eat soft diet. He is still dependent on how his caloric intake from feeding tube.  REVIEW OF SYSTEMS:   Constitutional: Denies fevers, chills or abnormal weight loss Eyes: Denies blurriness of vision Ears, nose, mouth, throat, and face: Denies mucositis Respiratory: Denies cough, dyspnea or wheezes Cardiovascular: Denies palpitation, chest discomfort or lower extremity swelling Gastrointestinal:  Denies nausea, heartburn or change in bowel habits Skin: Denies abnormal skin rashes Lymphatics: Denies new lymphadenopathy or easy bruising Neurological:Denies numbness, tingling or new weaknesses Behavioral/Psych: Mood is stable, no new changes  All other systems were reviewed with the patient and are negative.  I have reviewed the past medical history, past surgical history, social history and family history with the patient and they are unchanged from previous note.  ALLERGIES:  is allergic to codeine and velosef.  MEDICATIONS:  Current Outpatient Prescriptions  Medication Sig Dispense Refill  . acetaminophen (TYLENOL) 100 MG/ML solution Take 10 mg/kg by mouth every 4 (four) hours as needed for fever.    . diphenhydrAMINE (BENADRYL) 25 MG tablet Take 25 mg by mouth every 6 (six) hours as needed.    Marland Kitchen ibuprofen (ADVIL,MOTRIN) 200 MG tablet Take 200 mg by mouth at bedtime. As needed    . Nutritional Supplements (FEEDING SUPPLEMENT, OSMOLITE 1.5 CAL,) LIQD Begin 1 can Osmolite 1.5 QID with 60 ml free water before and after bolus feeding.  Increase to 1.5 cans QID on day 2.  Increase to goal of 1.5 cans BID and 2 cans BID day 3.  Flush with additional 240 cc water TID between feedings. Send formula please. 1659 mL 0  . ondansetron (ZOFRAN) 8 MG tablet Take 1 tablet (8 mg total) by mouth every 8 (eight) hours as needed for nausea or vomiting. 60 tablet 6  . prochlorperazine (COMPAZINE) 10 MG tablet TAKE 1 TABLET BY MOUTH EVERY 6  HOURS AS NEEDED FOR NAUSEA OR VOMITING (Patient not taking: Reported on 03/04/2014) 60 tablet 1  . sodium fluoride (FLUORISHIELD) 1.1 % GEL dental gel Place 1 application onto teeth at bedtime.     No current facility-administered medications for this visit.    PHYSICAL EXAMINATION: ECOG PERFORMANCE STATUS: 0 - Asymptomatic  Filed Vitals:   03/06/14 0859  BP: 114/74  Pulse: 88  Temp: 97.5 F (36.4 C)  Resp: 18   Filed Weights   03/06/14 0859  Weight: 195 lb 4.8 oz (88.587 kg)    GENERAL:alert, no distress and comfortable SKIN: skin color, texture, turgor are normal, no rashes or significant lesions EYES: normal, Conjunctiva are pink and non-injected, sclera clear OROPHARYNX:no exudate, no erythema and lips, buccal mucosa, and tongue normal  NECK: His neck is thick from prior radiation with evidence of lymphedema. LYMPH:  no palpable lymphadenopathy in the cervical, axillary or inguinal LUNGS: clear to auscultation and percussion with normal breathing effort HEART: regular rate & rhythm and no murmurs and no lower extremity edema ABDOMEN:abdomen soft, non-tender and normal bowel sounds. Feeding tube site looks okay Musculoskeletal:no cyanosis of digits and no clubbing  NEURO: alert & oriented x 3 with fluent speech, no focal motor/sensory deficits  LABORATORY DATA:  I have reviewed the data as listed    Component Value Date/Time   NA 134* 12/31/2013 1126   NA 134* 11/27/2013 0813   K 4.3 12/31/2013 1126   K 4.1 11/27/2013 0813   CL 95* 11/27/2013 0813   CO2 26 12/31/2013 1126   CO2 28 11/27/2013 0813   GLUCOSE 109 12/31/2013 1126   GLUCOSE 105* 11/27/2013 0813   BUN 20.5 12/31/2013 1126   BUN 19 11/27/2013 0813   CREATININE 1.3 12/31/2013 1126   CREATININE 1.16 11/27/2013 0813   CALCIUM 9.8 12/31/2013 1126   CALCIUM 8.9 11/27/2013 0813   PROT 7.0 12/31/2013 1126   PROT 6.9 11/27/2013 0813   ALBUMIN 3.5 12/31/2013 1126   ALBUMIN 3.2* 11/27/2013 0813   AST 16  12/31/2013 1126   AST 13 11/27/2013 0813   ALT 14 12/31/2013 1126   ALT 10 11/27/2013 0813   ALKPHOS 76 12/31/2013 1126   ALKPHOS 61 11/27/2013 0813   BILITOT 0.37 12/31/2013 1126   BILITOT 0.6 11/27/2013 0813   GFRNONAA 40* 11/19/2013 1105   GFRAA 47* 11/19/2013 1105    No results found for: SPEP, UPEP  Lab Results  Component Value Date   WBC 6.6 12/31/2013   NEUTROABS 4.2 12/31/2013   HGB 11.2* 12/31/2013   HCT 33.4* 12/31/2013   MCV 95.2 12/31/2013   PLT 314 12/31/2013      Chemistry      Component Value Date/Time   NA 134* 12/31/2013 1126   NA 134* 11/27/2013 0813   K 4.3 12/31/2013 1126   K 4.1 11/27/2013 0813   CL 95* 11/27/2013 0813   CO2 26 12/31/2013 1126   CO2 28 11/27/2013 0813   BUN 20.5 12/31/2013 1126   BUN 19 11/27/2013 0813   CREATININE 1.3 12/31/2013 1126   CREATININE 1.16 11/27/2013 0813      Component Value Date/Time   CALCIUM  9.8 12/31/2013 1126   CALCIUM 8.9 11/27/2013 0813   ALKPHOS 76 12/31/2013 1126   ALKPHOS 61 11/27/2013 0813   AST 16 12/31/2013 1126   AST 13 11/27/2013 0813   ALT 14 12/31/2013 1126   ALT 10 11/27/2013 0813   BILITOT 0.37 12/31/2013 1126   BILITOT 0.6 11/27/2013 0813      ASSESSMENT & PLAN:  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 I will call the PET/CT scan to be done in 6 weeks and see him back to review test results.   S/P gastrostomy The feeding tube site looks okay with no signs of infection.  He is tolerating oral diet. I recommended increase oral intake as tolerated with aim to get the feeding tube removed by next month.   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 I continue to encourage him to increase oral intake as tolerated. I recommend he keeps his appointment to see the  speech and language therapist for swallowing exercises.    Orders Placed This Encounter  Procedures  . NM PET Image Restag (PS) Skull Base To Thigh    Standing Status: Future     Number of Occurrences:      Standing Expiration Date: 05/06/2015    Order Specific Question:  Reason for Exam (SYMPTOM  OR DIAGNOSIS REQUIRED)    Answer:  staging tongue ca, assess response to Rx    Order Specific Question:  Preferred imaging location?    Answer:  [facility]   All questions were answered. The patient knows to call the clinic with any problems, questions or concerns. No barriers to learning was detected. I spent 25 minutes counseling the patient face to face. The total time spent in the appointment was 30 minutes and more than 50% was on counseling and review of test results     [facility], [place], MD 03/06/2014 12:20 PM

## 2014-03-06 NOTE — Assessment & Plan Note (Signed)
The feeding tube site looks okay with no signs of infection.  He is tolerating oral diet. I recommended increase oral intake as tolerated with aim to get the feeding tube removed by next month.

## 2014-03-06 NOTE — Assessment & Plan Note (Signed)
He is doing well apart from persistent minor side effects. I will call the PET/CT scan to be done in 6 weeks and see him back to review test results.

## 2014-03-06 NOTE — Assessment & Plan Note (Signed)
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 I continue to encourage him to increase oral intake as tolerated. I recommend he keeps his appointment to see the speech and language therapist for swallowing exercises.

## 2014-03-06 NOTE — Progress Notes (Signed)
To provide support and encouragement, care continuity and to assess for needs, met with patient during his appt with Dr. Alvy Bimler.  His wife accompanied him. He reported:  Eating soft foods e.g omelets, mac & cheese.  Denied choking on food as long as taken in small bites.  Using PEG to instill 6 cans of Osmolite daily.  Interest in have PEG removed. Dr. Alvy Bimler guided him to:  Change from Osmolite to Ensure (better tasting) with minimum goal of 190 calories orally.  Increase oral intake, decrease supplement.   Demonstrate weight stability with oral intake exclusively.  PEG could be removed if this goal met. He verbalized understanding of 05/02/14 f/u with Dr. Valere Dross. He understands he can contact me with needs/concerns.  Gayleen Orem, RN, BSN, [place] [facility] [phone]

## 2014-04-01 ENCOUNTER — Encounter: Payer: Self-pay | Admitting: Radiation Oncology

## 2014-04-01 ENCOUNTER — Ambulatory Visit
Admission: RE | Admit: 2014-04-01 | Discharge: 2014-04-01 | Disposition: A | Discharge status: Home / Self Care | Source: Ambulatory Visit | Attending: Radiation Oncology | Admitting: Radiation Oncology

## 2014-04-01 VITALS — BP 106/68 | HR 78 | Temp 98.3°F | Ht 65.0 in | Wt 194.2 lb

## 2014-04-01 DIAGNOSIS — C01 Malignant neoplasm of base of tongue: Secondary | ICD-10-CM

## 2014-04-01 NOTE — Progress Notes (Signed)
[name] Wu reports difficulty with swallowing some soft foods due to dry mouth.  He continues to use his PEG tube with Osmolite, 6 cans daily.  Oral mucosa looks moist with some thickened saliva.  Note edema of chin region.  Since 02/04/14 has lost 5 lbs, but last 2 weights unchanged.

## 2014-04-01 NOTE — Progress Notes (Signed)
CC: Dr. Lucien Mons, Dr. Jorja Loa, Dr. Heath Lark  Follow-up note:  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  He is doing well.  He has not yet seen Dr. Constance Holster.  He is scheduled for his PET scan on March 10.  He does have difficulty swallowing some food secondary to a dry mouth.  Tis with his PEG tube with Osmolite, 6 cans a day.  His weight is stable.  I note that he is taking Benadryl, and he tells me that he takes this at night to help him with his secretions.  He continues to work.  He has seen physical therapy for his neck edema and does have exercises.  Physical examination: Alert and oriented. Filed Vitals:   04/01/14 1125  BP: 106/68  Pulse: 78  Temp: 98.3 F (36.8 C)   Nodes: There is no palpable lymphadenopathy in the neck.  There is mild neck edema.  Oral cavity and oropharynx remarkable for mild xerostomia.  There is no candidiasis.  I cannot fully visualize the oropharynx or perform an indirect mirror examination secondary to a brisk gag reflex.  Impression: Satisfactory progress.  Plan: Follow visit with me in 2 months.  I asked that he see Dr. Constance Holster later in March, after his PET scan on March 10 for a fiberoptic endoscopy.  I told him that he can stop his Benadryl at bedtime which may be helping his sleep, but is probably thickening his secretions.

## 2014-04-17 ENCOUNTER — Ambulatory Visit (HOSPITAL_COMMUNITY)
Admission: RE | Admit: 2014-04-17 | Discharge: 2014-04-17 | Disposition: A | Discharge status: Home / Self Care | Payer: BLUE CROSS/BLUE SHIELD | Source: Ambulatory Visit | Attending: Hematology and Oncology | Admitting: Hematology and Oncology

## 2014-04-17 DIAGNOSIS — C01 Malignant neoplasm of base of tongue: Secondary | ICD-10-CM | POA: Insufficient documentation

## 2014-04-17 LAB — GLUCOSE, CAPILLARY: Glucose-Capillary: 101 mg/dL — ABNORMAL HIGH (ref 70–99)

## 2014-04-17 MED ORDER — FLUDEOXYGLUCOSE F - 18 (FDG) INJECTION
9.5400 | Freq: Once | INTRAVENOUS | Status: AC | PRN
  Administered 2014-04-17: 9.54 via INTRAVENOUS

## 2014-04-18 ENCOUNTER — Telehealth: Payer: Self-pay | Admitting: *Deleted

## 2014-04-18 NOTE — Telephone Encounter (Signed)
OK - thanks

## 2014-04-18 NOTE — Telephone Encounter (Signed)
Call placed 04/17/14 per Dr. Calton Dach guidance. 1. I informed patient of PET results from this morning.    He was very pleased scan was clear, expressed appreciation for timely follow-up. 2. He reported continued use of PEG.  Instilling 6 cans Osmolite daily. 3. Reported improved oral intake of soft foods, drinking milkshakes.  Taste buds "not a problem".  Occasional "gagging" on food but swallowing not an ongoing issue. 4. Reported he continues with swallowing exercises.  Gayleen Orem, RN, BSN, [place] [facility] [phone]

## 2014-04-23 ENCOUNTER — Encounter: Payer: Self-pay | Admitting: Hematology and Oncology

## 2014-04-23 ENCOUNTER — Other Ambulatory Visit: Payer: Self-pay | Admitting: Hematology and Oncology

## 2014-04-23 ENCOUNTER — Telehealth: Payer: Self-pay | Admitting: Hematology and Oncology

## 2014-04-23 ENCOUNTER — Ambulatory Visit (HOSPITAL_BASED_OUTPATIENT_CLINIC_OR_DEPARTMENT_OTHER): Payer: BLUE CROSS/BLUE SHIELD | Admitting: Hematology and Oncology

## 2014-04-23 VITALS — BP 110/71 | HR 73 | Temp 98.5°F | Resp 19 | Ht 65.0 in | Wt 193.7 lb

## 2014-04-23 DIAGNOSIS — I890 Lymphedema, not elsewhere classified: Secondary | ICD-10-CM

## 2014-04-23 DIAGNOSIS — Z931 Gastrostomy status: Secondary | ICD-10-CM | POA: Diagnosis not present

## 2014-04-23 DIAGNOSIS — C01 Malignant neoplasm of base of tongue: Secondary | ICD-10-CM

## 2014-04-23 DIAGNOSIS — R5383 Other fatigue: Secondary | ICD-10-CM

## 2014-04-23 HISTORY — DX: Other fatigue: R53.83

## 2014-04-23 NOTE — Telephone Encounter (Signed)
Gave avs & calendar for June. °

## 2014-04-24 NOTE — Progress Notes (Signed)
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 There are right level 2 and level 3 adenopathy as described and right peritracheal lymph node within the superior mediastinum   09/12/2013 Surgery Laryngoscopy showed the oropharynx revealed a firm friable mass involving the lateral base of tongue and inferior tonsillar fossa.    09/13/2013 Pathology Results Accession: [id number] biopsy confirmed squamous cell carcinoma, HPV positive   09/25/2013 Imaging PET scan showed right base of tongue mass exhibits intense FDG uptake compatible with primary head neck carcinoma with associated hypermetabolic right level to adenopathy   10/03/2013 Procedure He had dental extraction   10/21/2013 - 12/06/2013 Chemotherapy He start on high dose cisplatin. Dose number 3 reduced 50% due to side-effects   11/04/2013 - 12/23/2013 Radiation Therapy He received radiation therapy   11/19/2013 Procedure He had placement of right PICC line and removal of port   12/16/2013 Procedure The balloon in the feeding tube ruptured and the feeding tube fell out. It was replaced   04/17/2014 Imaging Repeat PET scan showed that the right tongue base hypermetabolic activity has essentially resolved. There is a small  mild hypermetabolic LN that needs to be monitored with CT neck    INTERVAL HISTORY: Please see below for problem oriented charting. He is seen today to review test results. In the meantime, he is doing well. He is able to maintain his weight. He is replacing 50% calorie intake by mouth. Denies dysphagia.  REVIEW OF SYSTEMS:   Constitutional: Denies fevers, chills or abnormal weight loss Eyes: Denies blurriness of vision Ears, nose, mouth, throat, and face: Denies mucositis or sore throat Respiratory: Denies cough, dyspnea or wheezes Cardiovascular: Denies palpitation, chest discomfort or lower extremity swelling Gastrointestinal:  Denies nausea, heartburn or change in bowel habits Skin: Denies abnormal skin rashes Lymphatics: Denies new lymphadenopathy or easy bruising Neurological:Denies numbness, tingling or new weaknesses Behavioral/Psych: Mood is stable, no new changes  All other systems were reviewed with the patient and are negative.  I have reviewed the past medical history, past surgical history, social history and family history with the patient and they are unchanged from previous note.  ALLERGIES:  is allergic to codeine and velosef.  MEDICATIONS:  Current Outpatient Prescriptions  Medication Sig Dispense Refill  . acetaminophen (TYLENOL) 100 MG/ML solution Take 10 mg/kg by mouth every 4 (four) hours as needed for fever.    . diphenhydrAMINE (BENADRYL) 25 MG tablet Take 25 mg by mouth every 6 (six) hours as needed.    Marland Kitchen ibuprofen (ADVIL,MOTRIN) 200 MG tablet Take 200 mg by mouth at bedtime. As needed    . Nutritional Supplements (FEEDING SUPPLEMENT, OSMOLITE 1.5 CAL,) LIQD Begin 1 can Osmolite 1.5 QID with 60 ml  free water before and after bolus feeding.  Increase to 1.5 cans QID on day 2.  Increase to goal of 1.5 cans BID and 2 cans BID day 3.  Flush with additional 240 cc water TID between feedings. Send formula please. 1659 mL 0  . ondansetron (ZOFRAN) 8 MG tablet Take  1 tablet (8 mg total) by mouth every 8 (eight) hours as needed for nausea or vomiting. 60 tablet 6  . prochlorperazine (COMPAZINE) 10 MG tablet TAKE 1 TABLET BY MOUTH EVERY 6 HOURS AS NEEDED FOR NAUSEA OR VOMITING 60 tablet 1  . sodium fluoride (FLUORISHIELD) 1.1 % GEL dental gel Place 1 application onto teeth at bedtime.     No current facility-administered medications for this visit.    PHYSICAL EXAMINATION: ECOG PERFORMANCE STATUS: 0 - Asymptomatic  Filed Vitals:   04/23/14 0927  BP: 110/71  Pulse: 73  Temp: 98.5 F (36.9 C)  Resp: 19   Filed Weights   04/23/14 0927  Weight: 193 lb 11.2 oz (87.862 kg)    GENERAL:alert, no distress and comfortable SKIN: skin color, texture, turgor are normal, no rashes or significant lesions EYES: normal, Conjunctiva are pink and non-injected, sclera clear OROPHARYNX:no exudate, no erythema and lips, buccal mucosa, and tongue normal  NECK: Noted lymphedema around his neck. LYMPH:  no palpable lymphadenopathy in the cervical, axillary or inguinal LUNGS: clear to auscultation and percussion with normal breathing effort HEART: regular rate & rhythm and no murmurs and no lower extremity edema ABDOMEN:abdomen soft, non-tender and normal bowel sounds Musculoskeletal:no cyanosis of digits and no clubbing  NEURO: alert & oriented x 3 with fluent speech, no focal motor/sensory deficits  LABORATORY DATA:  I have reviewed the data as listed    Component Value Date/Time   NA 134* 12/31/2013 1126   NA 134* 11/27/2013 0813   K 4.3 12/31/2013 1126   K 4.1 11/27/2013 0813   CL 95* 11/27/2013 0813   CO2 26 12/31/2013 1126   CO2 28 11/27/2013 0813   GLUCOSE 109 12/31/2013 1126   GLUCOSE 105* 11/27/2013 0813   BUN 20.5 12/31/2013 1126   BUN 19 11/27/2013 0813   CREATININE 1.3 12/31/2013 1126   CREATININE 1.16 11/27/2013 0813   CALCIUM 9.8 12/31/2013 1126   CALCIUM 8.9 11/27/2013 0813   PROT 7.0 12/31/2013 1126   PROT 6.9 11/27/2013 0813    ALBUMIN 3.5 12/31/2013 1126   ALBUMIN 3.2* 11/27/2013 0813   AST 16 12/31/2013 1126   AST 13 11/27/2013 0813   ALT 14 12/31/2013 1126   ALT 10 11/27/2013 0813   ALKPHOS 76 12/31/2013 1126   ALKPHOS 61 11/27/2013 0813   BILITOT 0.37 12/31/2013 1126   BILITOT 0.6 11/27/2013 0813   GFRNONAA 40* 11/19/2013 1105   GFRAA 47* 11/19/2013 1105    No results found for: SPEP, UPEP  Lab Results  Component Value Date   WBC 6.6 12/31/2013   NEUTROABS 4.2 12/31/2013   HGB 11.2* 12/31/2013   HCT 33.4* 12/31/2013   MCV 95.2 12/31/2013   PLT 314 12/31/2013      Chemistry      Component Value Date/Time   NA 134* 12/31/2013 1126   NA 134* 11/27/2013 0813   K 4.3 12/31/2013 1126   K 4.1 11/27/2013 0813   CL 95* 11/27/2013 0813   CO2 26 12/31/2013 1126   CO2 28 11/27/2013 0813   BUN 20.5 12/31/2013 1126   BUN 19 11/27/2013 0813   CREATININE 1.3 12/31/2013 1126   CREATININE  1.16 11/27/2013 0813      Component Value Date/Time   CALCIUM 9.8 12/31/2013 1126   CALCIUM 8.9 11/27/2013 0813   ALKPHOS 76 12/31/2013 1126   ALKPHOS 61 11/27/2013 0813   AST 16 12/31/2013 1126   AST 13 11/27/2013 0813   ALT 14 12/31/2013 1126   ALT 10 11/27/2013 0813   BILITOT 0.37 12/31/2013 1126   BILITOT 0.6 11/27/2013 0813       RADIOGRAPHIC STUDIES: I reviewed the PET CT scan with him and his wife. I have personally reviewed the radiological images as listed and agreed with the findings in the report.   ASSESSMENT & PLAN:  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 PET/CT scan showed complete response to treatment. There is questionable abnormal change near the larynx that would need further long-term follow-up. Per recommendation from recent ENT tumor board, I plan to order a CT scan of the neck to be done in 6 months. In the meantime, I will continue supportive care visits.   S/P gastrostomy The feeding tube site looks okay  with no signs of infection.  He is tolerating oral diet. I recommended increase oral intake as tolerated with aim to get the feeding tube removed by next month.   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 I recommend he increase exercises around his neck to reduce chronic lymphedema    Orders Placed This Encounter  Procedures  . CBC with Differential/Platelet    Standing Status: Future     Number of Occurrences:      Standing Expiration Date: 05/28/2015  . Comprehensive metabolic panel    Standing Status: Future     Number of Occurrences:      Standing Expiration Date: 05/28/2015  . TSH    Standing Status: Future     Number of Occurrences:      Standing Expiration Date: 05/28/2015   All questions were answered. The patient knows to call the clinic with any problems, questions or concerns. No barriers to learning was detected. I spent 15 minutes counseling the patient face to face. The total time spent in the appointment was 20 minutes and more than 50% was on counseling and review of test results     [facility], [place], MD 04/24/2014 12:56 PM

## 2014-04-24 NOTE — Assessment & Plan Note (Signed)
The feeding tube site looks okay with no signs of infection.  He is tolerating oral diet. I recommended increase oral intake as tolerated with aim to get the feeding tube removed by next month.

## 2014-04-24 NOTE — Assessment & Plan Note (Signed)
He is doing well apart from persistent minor side effects. PET/CT scan showed complete response to treatment. There is questionable abnormal change near the larynx that would need further long-term follow-up. Per recommendation from recent ENT tumor board, I plan to order a CT scan of the neck to be done in 6 months. In the meantime, I will continue supportive care visits.

## 2014-04-24 NOTE — Assessment & Plan Note (Signed)
This is new, related to recent radiation. I recommend he increase exercises around his neck to reduce chronic lymphedema

## 2014-05-02 ENCOUNTER — Telehealth: Payer: Self-pay | Admitting: Nutrition

## 2014-05-02 NOTE — Telephone Encounter (Signed)
Contacted patient by phone to follow-up. Patient reports he is "cancer free". He has begun to drink Ensure Plus in place of using Osmolite 1.5 through his feeding tube. He is also consuming some soft moist foods such as mashed potatoes and soup. Weight has decreased slightly and was documented as 193.7 pounds March 16, down from 200 pounds November 9.  Nutrition diagnosis: Inadequate oral intake improved.  Intervention: Educated patient and wife on strategies for increasing oral intake. Educated patient to consume Ensure Plus by mouth when able, and decrease Osmolite 1.5 via free feeding tube. Reviewed soft moist foods for patient to add to his diet. Questions were answered.  Teach back method used.  Monitoring, evaluation, goals: Patient will tolerate oral intake to decrease tube feeding.  Patient to contact me for questions or concerns.

## 2014-05-06 ENCOUNTER — Other Ambulatory Visit: Payer: Self-pay | Admitting: Hematology and Oncology

## 2014-05-15 NOTE — Therapy (Signed)
[facility] [address] [place], [place], [address] Phone: [phone]   Fax:  [phone]  Patient Details  Name: [name] Wu MRN: [id number] Date of Birth: 07/02/1955 Referring Provider:  No ref. provider found  Encounter Date: 05/15/2014  SPEECH THERAPY DISCHARGE SUMMARY  Visits from Start of Care: 1  Current functional level related to goals / functional outcomes: Pt did not return to any visits following the evaluation 10-07-13. He was called multiple times and did not return a call to schedule follow up ST visits.   Remaining deficits: Unknown at this time.   Education / Equipment: HEP to eliminate/reduce muscle fibrosis, late effects head/neck radiation, xerostomia, mucositis Plan: Patient agrees to discharge.  Patient goals were not met. Patient is being discharged due to not returning since the last visit.  ?????       [facility] 05/15/2014, 1:53 PM  [facility] [address] [place], [place], [address] Phone: [phone]   Fax:  [phone]

## 2014-05-30 ENCOUNTER — Encounter: Payer: Self-pay | Admitting: Radiation Oncology

## 2014-06-03 ENCOUNTER — Ambulatory Visit: Payer: BLUE CROSS/BLUE SHIELD | Admitting: Nutrition

## 2014-06-03 ENCOUNTER — Ambulatory Visit
Admission: RE | Admit: 2014-06-03 | Discharge: 2014-06-03 | Disposition: A | Discharge status: Home / Self Care | Source: Ambulatory Visit | Attending: Radiation Oncology | Admitting: Radiation Oncology

## 2014-06-03 ENCOUNTER — Encounter: Payer: Self-pay | Admitting: Radiation Oncology

## 2014-06-03 VITALS — BP 113/75 | HR 79 | Temp 98.2°F | Ht 65.0 in | Wt 186.5 lb

## 2014-06-03 DIAGNOSIS — C01 Malignant neoplasm of base of tongue: Secondary | ICD-10-CM

## 2014-06-03 NOTE — Progress Notes (Signed)
CC: Dr. Lucien Mons, Dr. Jorja Loa  Follow-up note:  Mr. 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  He is trying to wean himself off his PEG tube, but has lost 7 pounds over the past month.  He uses his PEG tube only in the morning and evening.  He has been taking in some diet supplements.  He states that his taste has returned for most foods.  I understand he saw Dr. Constance Holster earlier this month, by not sure if he performed a fiberoptic examination.  He continues to see his dentist, Dr. Yong Channel every 6 months.  He maintains follow-up with Dr. Alvy Bimler who will see him back in June.  He continues to work full-time.  Physical examination: Alert and oriented. Wt Readings from Last 3 Encounters:  04/23/14 193 lb 11.2 oz (87.862 kg)  03/06/14 195 lb 4.8 oz (88.587 kg)  02/03/14 199 lb 6.4 oz (90.447 kg)   Temp Readings from Last 3 Encounters:  04/23/14 98.5 F (36.9 C) Oral  03/06/14 97.5 F (36.4 C) Oral  02/03/14 98.5 F (36.9 C) Oral   BP Readings from Last 3 Encounters:  04/23/14 110/71  03/06/14 114/74  02/03/14 124/82   Pulse Readings from Last 3 Encounters:  04/23/14 73  03/06/14 88  02/03/14 96   Weight today is 186.5 pounds.  Nodes: There is no palpable lymphadenopathy in the neck.  He does have a small "dewlap" along his chin.  Oral cavity and oropharynx are unremarkable to inspection.  Indirect mirror examination not performed because of a brisk gag reflex.  Impression: No evidence for recurrent disease.  He will need to have a fiberoptic examination when he visits Dr. Constance Holster in early July.  He knows that he will not have his PEG tube removed until he can maintain his weight for 2 weeks without PEG tube supplementation.  He can call Dr. Alvy Bimler or me when he is ready to have his PEG tube removed.  We will have him see Ernestene Kiel from nutrition to work on his  caloric intake.  He knows that he will need diet supplementation.  Plan: As above.  Since he will see Dr. Alvy Bimler in June, and Dr. Constance Holster in early July, I will see Mr. Amsden for a follow-up visit in August.  He is to contact me if he continues to loses even 5 more pounds.

## 2014-06-03 NOTE — Progress Notes (Signed)
Nutrition follow-up completed with patient and wife. Patient reports continued weight loss and has noted a 7 pound weight loss since March 16. Patient consumes 3 Osmolite 1.5 via feeding tube, two Ensure Plus by mouth, and soft moist foods. Patient is a "picky eater" per wife. Patient has very limited variety of foods he will eat.   He reports difficulty with dry mouth.  01/06/14:  195.9 lb 03/04/14:    195.4 lb 04/23/14:    193.7 lb 06/03/14:    186.5 lb  Nutrition diagnosis: Inadequate oral intake continues.  Intervention:  Recommended patient replace Osmolite 1.5 via feeding tube with Ensure Plus by mouth.   Encouraged patient to consume a minimum of 5 Ensure Plus by mouth as well as increasing soft moist foods. Provided recommendations for ways to add calories and protein along with fact sheet on soft moist protein foods. Provided additional samples and coupons. Recommended patient weigh once a week at home at the same time and monitor weight changes.   If patient continues to have weight loss, he is to increase food intake and/or Ensure Plus by mouth.  Monitoring, evaluation, goals: Patient will increase oral intake to promote weight stabilization.  Next visit: Patient will call me with questions or concerns.

## 2014-06-03 NOTE — Progress Notes (Signed)
Mr. Hamza here for reassessment s/p XRT for squamous cell of the oropharynx.  He is starting to eat softer foods and continues with enteral nutrition, Osmolite at least 3 cans daily. Mild xerostomia.  He has lost 6 lbs since 04/23/14.  He spouse is very concerned about his weight lost, therefore Ernestene Kiel has been called to see him today.  Upon inspection note white coating on tongue, otherwise, oral mucosa pink and intact.

## 2014-07-07 ENCOUNTER — Other Ambulatory Visit: Payer: Self-pay | Admitting: Hematology and Oncology

## 2014-07-07 DIAGNOSIS — C01 Malignant neoplasm of base of tongue: Secondary | ICD-10-CM

## 2014-07-08 ENCOUNTER — Other Ambulatory Visit: Payer: Self-pay | Admitting: *Deleted

## 2014-07-24 ENCOUNTER — Ambulatory Visit (HOSPITAL_COMMUNITY)
Admission: RE | Admit: 2014-07-24 | Discharge: 2014-07-24 | Disposition: A | Discharge status: Home / Self Care | Payer: BLUE CROSS/BLUE SHIELD | Source: Ambulatory Visit | Attending: Hematology and Oncology | Admitting: Hematology and Oncology

## 2014-07-24 ENCOUNTER — Encounter: Payer: Self-pay | Admitting: Hematology and Oncology

## 2014-07-24 ENCOUNTER — Telehealth: Payer: Self-pay | Admitting: *Deleted

## 2014-07-24 ENCOUNTER — Telehealth: Payer: Self-pay | Admitting: Hematology and Oncology

## 2014-07-24 ENCOUNTER — Ambulatory Visit (HOSPITAL_BASED_OUTPATIENT_CLINIC_OR_DEPARTMENT_OTHER): Payer: BLUE CROSS/BLUE SHIELD | Admitting: Hematology and Oncology

## 2014-07-24 ENCOUNTER — Other Ambulatory Visit (HOSPITAL_BASED_OUTPATIENT_CLINIC_OR_DEPARTMENT_OTHER): Payer: BLUE CROSS/BLUE SHIELD

## 2014-07-24 VITALS — BP 111/76 | HR 67 | Temp 98.2°F | Resp 18 | Ht 65.0 in | Wt 192.9 lb

## 2014-07-24 DIAGNOSIS — C109 Malignant neoplasm of oropharynx, unspecified: Secondary | ICD-10-CM | POA: Insufficient documentation

## 2014-07-24 DIAGNOSIS — Z431 Encounter for attention to gastrostomy: Secondary | ICD-10-CM | POA: Insufficient documentation

## 2014-07-24 DIAGNOSIS — C01 Malignant neoplasm of base of tongue: Secondary | ICD-10-CM

## 2014-07-24 DIAGNOSIS — Z931 Gastrostomy status: Secondary | ICD-10-CM | POA: Diagnosis not present

## 2014-07-24 DIAGNOSIS — K117 Disturbances of salivary secretion: Secondary | ICD-10-CM

## 2014-07-24 DIAGNOSIS — E039 Hypothyroidism, unspecified: Secondary | ICD-10-CM

## 2014-07-24 DIAGNOSIS — R682 Dry mouth, unspecified: Secondary | ICD-10-CM

## 2014-07-24 DIAGNOSIS — R5383 Other fatigue: Secondary | ICD-10-CM

## 2014-07-24 LAB — COMPREHENSIVE METABOLIC PANEL (CC13)
ALT: 19 U/L (ref 0–55)
AST: 18 U/L (ref 5–34)
Albumin: 3.7 g/dL (ref 3.5–5.0)
Alkaline Phosphatase: 69 U/L (ref 40–150)
Anion Gap: 7 mEq/L (ref 3–11)
BUN: 24.1 mg/dL (ref 7.0–26.0)
CO2: 29 mEq/L (ref 22–29)
Calcium: 9.1 mg/dL (ref 8.4–10.4)
Chloride: 105 mEq/L (ref 98–109)
Creatinine: 1.2 mg/dL (ref 0.7–1.3)
EGFR: 63 mL/min/{1.73_m2} — ABNORMAL LOW (ref >90)
Glucose: 98 mg/dl (ref 70–140)
Potassium: 4.5 mEq/L (ref 3.5–5.1)
Sodium: 141 mEq/L (ref 136–145)
Total Bilirubin: 1.03 mg/dL (ref 0.20–1.20)
Total Protein: 6.8 g/dL (ref 6.4–8.3)

## 2014-07-24 LAB — CBC WITH DIFFERENTIAL/PLATELET
BASO%: 0.4 % (ref 0.0–2.0)
Basophils Absolute: 0 10*3/uL (ref 0.0–0.1)
EOS%: 3.2 % (ref 0.0–7.0)
Eosinophils Absolute: 0.2 10*3/uL (ref 0.0–0.5)
HCT: 44.4 % (ref 38.4–49.9)
HGB: 14.7 g/dL (ref 13.0–17.1)
LYMPH%: 21.8 % (ref 14.0–49.0)
MCH: 32.4 pg (ref 27.2–33.4)
MCHC: 33.2 g/dL (ref 32.0–36.0)
MCV: 97.5 fL (ref 79.3–98.0)
MONO#: 0.8 10*3/uL (ref 0.1–0.9)
MONO%: 12.9 % (ref 0.0–14.0)
NEUT#: 3.9 10*3/uL (ref 1.5–6.5)
NEUT%: 61.7 % (ref 39.0–75.0)
Platelets: 236 10*3/uL (ref 140–400)
RBC: 4.56 10*6/uL (ref 4.20–5.82)
RDW: 12.7 % (ref 11.0–14.6)
WBC: 6.3 10*3/uL (ref 4.0–10.3)
lymph#: 1.4 10*3/uL (ref 0.9–3.3)

## 2014-07-24 LAB — TSH CHCC: TSH: 8.564 m(IU)/L — ABNORMAL HIGH (ref 0.320–4.118)

## 2014-07-24 MED ORDER — LEVOTHYROXINE SODIUM 50 MCG PO TABS: 50.0000 ug | ORAL_TABLET | Freq: Every day | ORAL | Status: DC

## 2014-07-24 MED ORDER — LIDOCAINE VISCOUS 2 % MT SOLN
OROMUCOSAL | Status: AC
  Filled 2014-07-24: qty 15

## 2014-07-24 MED ORDER — LIDOCAINE VISCOUS 2 % MT SOLN
15.0000 mL | Freq: Once | OROMUCOSAL | Status: DC
Start: 2014-07-24 — End: 2014-07-25

## 2014-07-24 NOTE — Assessment & Plan Note (Signed)
He has persistent dry mouth from recent radiation treatment but it is improving. Encourage frequent oral fluid intake.

## 2014-07-24 NOTE — Telephone Encounter (Signed)
-----   Message from Heath Lark, MD sent at 07/24/2014  9:58 AM EDT ----- Regarding: abnormal TSH He needs to be started on levothyroxine 50 mcg daily PO, please call in 3 months supply, 1 refill Remind his wife he needs TSH rechecked with PCP in 3 mo ----- Message -----    From: [facility]    Sent: 07/24/2014   8:41 AM      To: Heath Lark, MD

## 2014-07-24 NOTE — Assessment & Plan Note (Signed)
He is doing well apart and has fully recovered from side effects of treatment.  PET/CT scan in March 2016 showed complete response to treatment.  There is questionable abnormal change near the larynx that would need further long-term follow-up.  Per recommendation from recent ENT tumor board, I plan to order a CT scan of the neck to be done in September 2016

## 2014-07-24 NOTE — Assessment & Plan Note (Signed)
He has post treatment hypothyroidism. I will start him on 50 g of levothyroxine and recheck in 3 months.

## 2014-07-24 NOTE — Assessment & Plan Note (Signed)
He is eating well and is gaining weight. We will get his feeding tube removed.

## 2014-07-24 NOTE — Telephone Encounter (Signed)
Informed pt's wife of Abnormal TSH and new rx for thryroid replacement sent to pt's pharmacy.   Informed her it was recommended by tumor board for pt to f/u one more time in 3 months w/ CT scan and MD visit Dr. Alvy Bimler.  She will recheck pt's TSH at that visit.  Expect a call for CT and lab/MD in Sept.. She verbalized understanding.

## 2014-07-24 NOTE — Progress Notes (Signed)
Patient ID: [name] Wu, male   DOB: 03-Jan-1956, 59 y.o.   MRN: [id number]     Referring Physician(s): Gorsuch,Ni  Subjective:  [name] is here today for removal of his gastric tube. He was being treated for Cancer of the right oropharynx  He is tolerating PO and no longer needs it.    Allergies: Codeine and Velosef  Medications: Prior to Admission medications   Medication Sig Start Date End Date Taking? Authorizing Provider  ENSURE PLUS (ENSURE PLUS) LIQD Take 237 mLs by mouth. 6 to 7 cans per day.    Historical Provider, MD  sodium fluoride (FLUORISHIELD) 1.1 % GEL dental gel Place 1 application onto teeth at bedtime.    Historical Provider, MD     Vital Signs: There were no vitals taken for this visit.  Physical Exam   G-tube in place and intact.  Fluid removed from Balloon.  Tube easily removed.  G-Tube was intact upon removal.    Imaging: No results found.  Labs:  CBC:  Recent Labs  12/17/13 1016 12/24/13 1029 12/31/13 1126 07/24/14 0829  WBC 9.2 7.1 6.6 6.3  HGB 10.6* 10.7* 11.2* 14.7  HCT 31.1* 31.7* 33.4* 44.4  PLT 302 262 314 236    COAGS:  Recent Labs  11/19/13 1105  INR 1.01    BMP:  Recent Labs  09/09/13 1339  10/16/13 1257 10/17/13 1857  11/19/13 1105 11/27/13 0813  12/17/13 1017 12/24/13 1029 12/31/13 1126 07/24/14 0829  NA 142  < > 139  --   < > 135* 134*  < > 132* 131* 134* 141  K 4.9  < > 4.4  --   < > 3.4* 4.1  < > 4.8 4.7 4.3 4.5  CL 106  --  102  --   --  92* 95*  --   --   --   --   --   CO2 26  < > 24  --   < > 29 28  < > 30* 29 26 29   GLUCOSE 91  < > 98  --   < > 106* 105*  < > 107 101 109 98  BUN 21  < > 14  --   < > 28* 19  < > 17.1 19.4 20.5 24.1  CALCIUM 9.4  < > 9.3  --   < > 8.4 8.9  < > 9.5 9.3 9.8 9.1  CREATININE 1.41*  < > 1.19 1.12  < > 1.78* 1.16  < > 1.2 1.1 1.3 1.2  GFRNONAA 53*  --  66* 71*  --  40*  --   --   --   --   --   --   GFRAA 62*  --  76* 82*  --  47*  --   --   --   --   --   --     < > = values in this interval not displayed.  LIVER FUNCTION TESTS:  Recent Labs  12/17/13 1017 12/24/13 1029 12/31/13 1126 07/24/14 0829  BILITOT 0.42 0.40 0.37 1.03  AST 15 17 16 18   ALT 13 15 14 19   ALKPHOS 74 67 76 69  PROT 6.9 7.0 7.0 6.8  ALBUMIN 3.2* 3.3* 3.5 3.7    Assessment and Plan:  Dry dressing placed over stoma site.  Instructions given to patient which include refraining from eating for at least 2 hours.  Small meals until site is completely healed.  He understands there  may be some drainage until it is healed.  Keep a clean dry dressing in place until there is no longer any drainage.  He verbalized understanding.  Signed: Murrell Redden PA-C 07/24/2014, 9:35 AM   I spent a total of 15 Minutes  For this procedure and greater than 50% of which was counseling/educating care for removal of G-Tube.

## 2014-07-24 NOTE — Progress Notes (Signed)
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 There are right level 2 and level 3 adenopathy as described and right peritracheal lymph node within the superior mediastinum   09/12/2013 Surgery Laryngoscopy showed the oropharynx revealed a firm friable mass involving the lateral base of tongue and inferior tonsillar fossa.    09/13/2013 Pathology Results Accession: [id number] biopsy confirmed squamous cell carcinoma, HPV positive   09/25/2013 Imaging PET scan showed right base of tongue mass exhibits intense FDG uptake compatible with primary head neck carcinoma with associated hypermetabolic right level to adenopathy   10/03/2013 Procedure He had dental extraction   10/21/2013 - 12/06/2013 Chemotherapy He start on high dose cisplatin. Dose number 3 reduced 50% due to side-effects   11/04/2013 - 12/23/2013 Radiation Therapy He received radiation therapy   11/19/2013 Procedure He had placement of right PICC line and removal of port   12/16/2013 Procedure The balloon in the feeding tube ruptured and the feeding tube fell out. It was replaced   04/17/2014 Imaging Repeat PET scan showed that the right tongue base hypermetabolic activity has essentially resolved. There is a small  mild hypermetabolic LN that needs to be monitored with CT neck    INTERVAL HISTORY: Please see below for problem oriented charting. He returns for further follow-up. He is doing well and is gaining weight. He is eating well without any dysphagia. He has very mild persistent dry mouth and altered taste sensation but it does not bother him. He denies pain.  REVIEW OF SYSTEMS:   Constitutional: Denies fevers, chills or abnormal weight loss Eyes: Denies blurriness of vision Ears, nose, mouth, throat, and face: Denies mucositis or sore throat Respiratory: Denies cough, dyspnea or wheezes Cardiovascular: Denies palpitation, chest discomfort or lower extremity swelling Gastrointestinal:  Denies nausea, heartburn or change in bowel habits Skin: Denies abnormal skin rashes Lymphatics: Denies new lymphadenopathy or easy bruising Neurological:Denies numbness, tingling or new weaknesses Behavioral/Psych: Mood is stable, no new changes  All other systems were reviewed with the patient and are negative.  I have reviewed the past medical history, past surgical history, social history and family history with the patient and they are unchanged from previous note.  ALLERGIES:  is allergic to codeine and velosef.  MEDICATIONS:  Current Outpatient Prescriptions  Medication Sig Dispense Refill  . ENSURE PLUS (ENSURE PLUS) LIQD Take 237 mLs by mouth. 6 to 7 cans per day.    . sodium fluoride (FLUORISHIELD) 1.1 % GEL dental gel Place 1 application onto teeth at bedtime.    Marland Kitchen levothyroxine (SYNTHROID) 50 MCG tablet Take 1 tablet (50 mcg total) by mouth daily before breakfast. 90 tablet 1   No current facility-administered medications for this visit.  Facility-Administered Medications Ordered in Other Visits  Medication Dose Route Frequency Provider Last Rate Last Dose  . lidocaine (XYLOCAINE) 2 % viscous mouth solution 15 mL  15 mL Mouth/Throat Once Markus Daft, MD        PHYSICAL EXAMINATION: ECOG  PERFORMANCE STATUS: 0 - Asymptomatic  Filed Vitals:   07/24/14 0840  BP: 111/76  Pulse: 67  Temp: 98.2 F (36.8 C)  Resp: 18   Filed Weights   07/24/14 0840  Weight: 192 lb 14.4 oz (87.499 kg)    GENERAL:alert, no distress and comfortable SKIN: skin color, texture, turgor are normal, no rashes or significant lesions EYES: normal, Conjunctiva are pink and non-injected, sclera clear OROPHARYNX:no exudate, no erythema and lips, buccal mucosa, and tongue normal  NECK: supple, thyroid normal size, non-tender, without nodularity LYMPH:  no palpable lymphadenopathy in the cervical, axillary or inguinal LUNGS: clear to auscultation and percussion with normal breathing effort HEART: regular rate & rhythm and no murmurs and no lower extremity edema ABDOMEN:abdomen soft, non-tender and normal bowel sounds Musculoskeletal:no cyanosis of digits and no clubbing  NEURO: alert & oriented x 3 with fluent speech, no focal motor/sensory deficits  LABORATORY DATA:  I have reviewed the data as listed    Component Value Date/Time   NA 141 07/24/2014 0829   NA 134* 11/27/2013 0813   K 4.5 07/24/2014 0829   K 4.1 11/27/2013 0813   CL 95* 11/27/2013 0813   CO2 29 07/24/2014 0829   CO2 28 11/27/2013 0813   GLUCOSE 98 07/24/2014 0829   GLUCOSE 105* 11/27/2013 0813   BUN 24.1 07/24/2014 0829   BUN 19 11/27/2013 0813   CREATININE 1.2 07/24/2014 0829   CREATININE 1.16 11/27/2013 0813   CALCIUM 9.1 07/24/2014 0829   CALCIUM 8.9 11/27/2013 0813   PROT 6.8 07/24/2014 0829   PROT 6.9 11/27/2013 0813   ALBUMIN 3.7 07/24/2014 0829   ALBUMIN 3.2* 11/27/2013 0813   AST 18 07/24/2014 0829   AST 13 11/27/2013 0813   ALT 19 07/24/2014 0829   ALT 10 11/27/2013 0813   ALKPHOS 69 07/24/2014 0829   ALKPHOS 61 11/27/2013 0813   BILITOT 1.03 07/24/2014 0829   BILITOT 0.6 11/27/2013 0813   GFRNONAA 40* 11/19/2013 1105   GFRAA 47* 11/19/2013 1105    No results found for: SPEP, UPEP  Lab Results   Component Value Date   WBC 6.3 07/24/2014   NEUTROABS 3.9 07/24/2014   HGB 14.7 07/24/2014   HCT 44.4 07/24/2014   MCV 97.5 07/24/2014   PLT 236 07/24/2014      Chemistry      Component Value Date/Time   NA 141 07/24/2014 0829   NA 134* 11/27/2013 0813   K 4.5 07/24/2014 0829   K 4.1 11/27/2013 0813   CL 95* 11/27/2013 0813   CO2 29 07/24/2014 0829   CO2 28 11/27/2013 0813   BUN 24.1 07/24/2014 0829   BUN 19 11/27/2013 0813   CREATININE 1.2 07/24/2014 0829   CREATININE 1.16 11/27/2013 0813      Component Value Date/Time   CALCIUM 9.1 07/24/2014 0829   CALCIUM 8.9 11/27/2013 0813   ALKPHOS 69 07/24/2014 0829   ALKPHOS 61 11/27/2013 0813   AST 18 07/24/2014 0829   AST 13 11/27/2013 0813   ALT 19 07/24/2014 0829   ALT 10 11/27/2013 0813   BILITOT 1.03 07/24/2014 0829   BILITOT 0.6 11/27/2013 0813      ASSESSMENT & PLAN:  Squamous cell carcinoma of the  right base of tongue He is doing well apart and has fully recovered from side effects of treatment.  PET/CT scan in March 2016 showed complete response to treatment.  There is questionable abnormal change near the larynx that would need further long-term follow-up.  Per recommendation from recent ENT tumor board, I plan to order a CT scan of the neck to be done in September 2016  S/P gastrostomy He is eating well and is gaining weight. We will get his feeding tube removed.  Xerostomia He has persistent dry mouth from recent radiation treatment but it is improving. Encourage frequent oral fluid intake.  Hypothyroidism (acquired) He has post treatment hypothyroidism. I will start him on 50 g of levothyroxine and recheck in 3 months.   Orders Placed This Encounter  Procedures  . CT Soft Tissue Neck W Contrast    Standing Status: Future     Number of Occurrences:      Standing Expiration Date: 10/24/2015    Order Specific Question:  Reason for Exam (SYMPTOM  OR DIAGNOSIS REQUIRED)    Answer:  base of tongue  ca, abnormal changes in larynx, exclude reucrrence    Order Specific Question:  Preferred imaging location?    Answer:  [facility]  . CBC with Differential/Platelet    Standing Status: Future     Number of Occurrences:      Standing Expiration Date: 10/24/2015  . Comprehensive metabolic panel    Standing Status: Future     Number of Occurrences:      Standing Expiration Date: 10/24/2015  . TSH    Standing Status: Future     Number of Occurrences:      Standing Expiration Date: 10/24/2015   All questions were answered. The patient knows to call the clinic with any problems, questions or concerns. No barriers to learning was detected. I spent 25 minutes counseling the patient face to face. The total time spent in the appointment was 30 minutes and more than 50% was on counseling and review of test results     [facility], [place], MD 07/24/2014 11:06 AM

## 2014-07-24 NOTE — Telephone Encounter (Signed)
lvm for pt regarding to Sept appt mailed pt appt sched andl etter °

## 2014-09-17 ENCOUNTER — Encounter: Payer: Self-pay | Admitting: Radiation Oncology

## 2014-09-23 ENCOUNTER — Other Ambulatory Visit: Payer: Self-pay | Admitting: Hematology and Oncology

## 2014-09-23 ENCOUNTER — Ambulatory Visit
Admission: RE | Admit: 2014-09-23 | Discharge: 2014-09-23 | Disposition: A | Discharge status: Home / Self Care | Payer: BLUE CROSS/BLUE SHIELD | Source: Ambulatory Visit | Attending: Radiation Oncology | Admitting: Radiation Oncology

## 2014-09-23 ENCOUNTER — Encounter: Payer: Self-pay | Admitting: *Deleted

## 2014-09-23 ENCOUNTER — Encounter: Payer: Self-pay | Admitting: Radiation Oncology

## 2014-09-23 VITALS — BP 130/81 | HR 90 | Temp 98.8°F | Resp 20 | Ht 65.5 in | Wt 204.0 lb

## 2014-09-23 DIAGNOSIS — C01 Malignant neoplasm of base of tongue: Secondary | ICD-10-CM

## 2014-09-23 HISTORY — DX: Personal history of irradiation: Z92.3

## 2014-09-23 NOTE — Progress Notes (Signed)
  Oncology Nurse Navigator Documentation   Navigator Encounter Type: Other (09/23/14 1005)     Barriers/Navigation Needs: No barriers at this time (09/23/14 1005)     To provide support and encouragement, care continuity and to assess for needs, met with patient during f/u appt with Dr. Valere Dross.  He was accompanied by his wife Joseph Art. 1. He reported:  Ongoing dry mouth, "water makes my mouth dry".  I provided coupons for Therabreath products noting previous patient found lozenges very helpful.  Has gained weight.  Eating at near baseline except for breads; needs to chew meat very thoroughly before swallowing. 2. He noted that he had received information about upcoming H&N [facility].    I shared an overview of the 5-week program, encouraged them to attend.  They were receptive to the idea. 3. He did not express any needs or concerns at this time, I encouraged him to contact me if that changes, he verbalized understanding.   Gayleen Orem, RN, BSN, [place] at [place] [phone]

## 2014-09-23 NOTE — Progress Notes (Addendum)
Follow up s/p right tonsil rad txs 10/27/13-12/23/13, still drinks 6 cans ensure daily,eats green beans, corn,  Difficult with mashed potatoes gets gummed up unable to eat this, still uses dental shield at night, energy level  Great, no nausea, has gained from 186  Lbs to 204 lbs since last visit, saw Dr. Constance Holster in July 2016 said things looked fine stated patient,  Next appt  Due in October,wil call him  10:10 AM BP 130/81 mmHg  Pulse 90  Temp(Src) 98.8 F (37.1 C) (Oral)  Resp 20  Ht 5' 5.5" (1.664 m)  Wt 204 lb (92.534 kg)  BMI 33.42 kg/m2  SpO2 100%  Wt Readings from Last 3 Encounters:  09/23/14 204 lb (92.534 kg)  07/24/14 192 lb 14.4 oz (87.499 kg)  06/03/14 186 lb 8 oz (84.596 kg)

## 2014-09-23 NOTE — Progress Notes (Signed)
CC: Everardo Beals, NP, Dr. Heath Lark, Dr. Lucien Mons, Dr. Jorja Loa  Follow-up note:  Mr. 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  His PEG tube was removed in June.  His weight is up approximately 18 pounds since I last saw him in late April.  He continues with diet supplements.  He will see Dr. Constance Holster for a follow-up visit this October.  He also maintains follow-up with Dr. Alvy Bimler who is obtaining a follow-up CT scan.  His post treatment PET scan was essentially negative.  His xerostomia and taste continue to improve.  He maintains his dental follow-up and he continues to work full-time.  He is on thyroid supplementation.  Physical examination: Alert and oriented. Wt Readings from Last 3 Encounters:  09/23/14 204 lb (92.534 kg)  07/24/14 192 lb 14.4 oz (87.499 kg)  06/03/14 186 lb 8 oz (84.596 kg)   Temp Readings from Last 3 Encounters:  09/23/14 98.8 F (37.1 C) Oral  07/24/14 98.2 F (36.8 C) Oral  06/03/14 98.2 F (36.8 C)    BP Readings from Last 3 Encounters:  09/23/14 130/81  07/24/14 111/76  06/03/14 113/75   Pulse Readings from Last 3 Encounters:  09/23/14 90  07/24/14 67  06/03/14 79   Nodes: There is no palpable lymphadenopathy in the neck.  There is a small "dewlap" along his chin representing subcutaneous fibrosis.  Oral cavity and oropharynx are unremarkable to inspection except for scarring along his right oropharynx and mild xerostomia.  His gag reflex is not as severe.  Indirect mirror examination is less than ideal but I do not see a mass along his right oropharynx.  Tongue is normal to palpation.  Impression: No evidence for recurrent disease.  He will see Dr. Constance Holster for a follow-up visit in October and I will see him back for a follow-up visit in December.  I told him that he can discontinue his diet supplementation and eat a balanced  and healthy diet.  Plan: Follow visit with me in December.  CAT scan pending through Dr. Alvy Bimler.  He will see Dr. Constance Holster in October.

## 2014-09-24 ENCOUNTER — Telehealth: Payer: Self-pay | Admitting: *Deleted

## 2014-09-24 NOTE — Telephone Encounter (Signed)
Spoke with wife. CT is scheduled for 9/19 @ 1100. Has labs at 1000.

## 2014-10-27 ENCOUNTER — Telehealth: Payer: Self-pay | Admitting: *Deleted

## 2014-10-27 ENCOUNTER — Other Ambulatory Visit (HOSPITAL_BASED_OUTPATIENT_CLINIC_OR_DEPARTMENT_OTHER): Payer: BLUE CROSS/BLUE SHIELD

## 2014-10-27 ENCOUNTER — Ambulatory Visit (HOSPITAL_COMMUNITY)
Admission: RE | Admit: 2014-10-27 | Discharge: 2014-10-27 | Disposition: A | Discharge status: Home / Self Care | Payer: BLUE CROSS/BLUE SHIELD | Source: Ambulatory Visit | Attending: Hematology and Oncology | Admitting: Hematology and Oncology

## 2014-10-27 DIAGNOSIS — C770 Secondary and unspecified malignant neoplasm of lymph nodes of head, face and neck: Secondary | ICD-10-CM | POA: Insufficient documentation

## 2014-10-27 DIAGNOSIS — Z923 Personal history of irradiation: Secondary | ICD-10-CM | POA: Diagnosis not present

## 2014-10-27 DIAGNOSIS — C01 Malignant neoplasm of base of tongue: Secondary | ICD-10-CM

## 2014-10-27 DIAGNOSIS — E039 Hypothyroidism, unspecified: Secondary | ICD-10-CM

## 2014-10-27 DIAGNOSIS — Z9221 Personal history of antineoplastic chemotherapy: Secondary | ICD-10-CM | POA: Diagnosis not present

## 2014-10-27 LAB — CBC WITH DIFFERENTIAL/PLATELET
BASO%: 0.6 % (ref 0.0–2.0)
Basophils Absolute: 0 10*3/uL (ref 0.0–0.1)
EOS%: 4.3 % (ref 0.0–7.0)
Eosinophils Absolute: 0.3 10*3/uL (ref 0.0–0.5)
HCT: 45.8 % (ref 38.4–49.9)
HGB: 15.4 g/dL (ref 13.0–17.1)
LYMPH%: 21.4 % (ref 14.0–49.0)
MCH: 32.4 pg (ref 27.2–33.4)
MCHC: 33.7 g/dL (ref 32.0–36.0)
MCV: 96.1 fL (ref 79.3–98.0)
MONO#: 0.9 10*3/uL (ref 0.1–0.9)
MONO%: 12.4 % (ref 0.0–14.0)
NEUT#: 4.2 10*3/uL (ref 1.5–6.5)
NEUT%: 61.3 % (ref 39.0–75.0)
Platelets: 227 10*3/uL (ref 140–400)
RBC: 4.77 10*6/uL (ref 4.20–5.82)
RDW: 13.3 % (ref 11.0–14.6)
WBC: 6.9 10*3/uL (ref 4.0–10.3)
lymph#: 1.5 10*3/uL (ref 0.9–3.3)

## 2014-10-27 LAB — COMPREHENSIVE METABOLIC PANEL (CC13)
ALT: 22 U/L (ref 0–55)
AST: 19 U/L (ref 5–34)
Albumin: 3.8 g/dL (ref 3.5–5.0)
Alkaline Phosphatase: 65 U/L (ref 40–150)
Anion Gap: 7 mEq/L (ref 3–11)
BUN: 20.8 mg/dL (ref 7.0–26.0)
CO2: 29 mEq/L (ref 22–29)
Calcium: 9.3 mg/dL (ref 8.4–10.4)
Chloride: 106 mEq/L (ref 98–109)
Creatinine: 1.3 mg/dL (ref 0.7–1.3)
EGFR: 62 mL/min/{1.73_m2} — ABNORMAL LOW (ref >90)
Glucose: 98 mg/dl (ref 70–140)
Potassium: 4.5 mEq/L (ref 3.5–5.1)
Sodium: 142 mEq/L (ref 136–145)
Total Bilirubin: 0.87 mg/dL (ref 0.20–1.20)
Total Protein: 6.8 g/dL (ref 6.4–8.3)

## 2014-10-27 LAB — TSH CHCC: TSH: 6.927 m(IU)/L — ABNORMAL HIGH (ref 0.320–4.118)

## 2014-10-27 MED ORDER — IOHEXOL 300 MG/ML  SOLN
75.0000 mL | Freq: Once | INTRAMUSCULAR | Status: AC | PRN
  Administered 2014-10-27: 75 mL via INTRAVENOUS

## 2014-10-27 NOTE — Telephone Encounter (Signed)
Pt states he takes his thyroid pill every am

## 2014-10-29 ENCOUNTER — Ambulatory Visit (HOSPITAL_BASED_OUTPATIENT_CLINIC_OR_DEPARTMENT_OTHER): Payer: BLUE CROSS/BLUE SHIELD | Admitting: Hematology and Oncology

## 2014-10-29 ENCOUNTER — Encounter: Payer: Self-pay | Admitting: Hematology and Oncology

## 2014-10-29 VITALS — BP 119/79 | HR 68 | Temp 98.1°F | Resp 18 | Ht 65.5 in | Wt 207.3 lb

## 2014-10-29 DIAGNOSIS — R1310 Dysphagia, unspecified: Secondary | ICD-10-CM

## 2014-10-29 DIAGNOSIS — C01 Malignant neoplasm of base of tongue: Secondary | ICD-10-CM

## 2014-10-29 DIAGNOSIS — R682 Dry mouth, unspecified: Secondary | ICD-10-CM

## 2014-10-29 DIAGNOSIS — Z23 Encounter for immunization: Secondary | ICD-10-CM

## 2014-10-29 DIAGNOSIS — K117 Disturbances of salivary secretion: Secondary | ICD-10-CM | POA: Diagnosis not present

## 2014-10-29 DIAGNOSIS — E039 Hypothyroidism, unspecified: Secondary | ICD-10-CM | POA: Diagnosis not present

## 2014-10-29 MED ORDER — INFLUENZA VAC SPLIT QUAD 0.5 ML IM SUSY
0.5000 mL | PREFILLED_SYRINGE | Freq: Once | INTRAMUSCULAR | Status: AC
  Administered 2014-10-29: 0.5 mL via INTRAMUSCULAR
  Filled 2014-10-29: qty 0.5

## 2014-10-29 NOTE — Assessment & Plan Note (Signed)
He has persistent dry mouth from recent radiation treatment but it is improving. Encourage frequent oral fluid intake. 

## 2014-10-29 NOTE — Progress Notes (Signed)
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 There are right level 2 and level 3 adenopathy as described and right peritracheal lymph node within the superior mediastinum   09/12/2013 Surgery Laryngoscopy showed the oropharynx revealed a firm friable mass involving the lateral base of tongue and inferior tonsillar fossa.    09/13/2013 Pathology Results Accession [id number]:  Biopsy confirmed squamous cell carcinoma, HPV positive.   09/25/2013 Imaging PET scan showed right base of tongue mass exhibits intense FDG uptake compatible with primary head neck carcinoma with associated hypermetabolic right level to adenopathy   10/03/2013 Procedure He had dental extraction   10/21/2013 - 12/06/2013 Chemotherapy He start on high dose cisplatin. Dose number 3 reduced 50% due to side-effects   11/04/2013 - 12/23/2013 Radiation Therapy Received Helicle IMRT Tomotherapy:  Right oropharynx 7000 cGy in 35 sessions, right neck adenopathy 7000 cGy in 35 sessions. High risk nodal bed 5950 cGy in 35 sessions, and low risk nodal bed 5600 cGy in 35 sessions.   11/19/2013 Procedure He had placement of right PICC line and removal of port   12/16/2013 Procedure The balloon in the feeding  tube ruptured and the feeding tube fell out. It was replaced   04/17/2014 Imaging Re-staging PET showed that the right tongue base hypermetabolic activity has essentially resolved. There is a small mild hypermetabolic LN that needs to be monitored with CT neck   07/24/2014 Procedure PEG removed.    INTERVAL HISTORY: Please see below for problem oriented charting.  he returns for further follow-up. He has persistent dry mouth and dysphagia. He continues to eat soft diet along with ensure. He has not lost any weight. Denies recent choking sensation. No new lymphadenopathy.  He complained of fatigue.  He had recent dental issue but it is not severe and does not require dental extraction  REVIEW OF SYSTEMS:   Constitutional: Denies fevers, chills or abnormal weight loss Eyes: Denies blurriness of vision Ears, nose, mouth, throat, and face: Denies mucositis or sore throat Respiratory: Denies cough, dyspnea or wheezes Cardiovascular: Denies palpitation, chest discomfort or lower extremity swelling Gastrointestinal:  Denies nausea, heartburn or change in bowel habits Skin: Denies abnormal skin rashes Lymphatics: Denies new lymphadenopathy or easy bruising Neurological:Denies numbness, tingling or new weaknesses Behavioral/Psych: Mood is stable, no new changes  All other systems were reviewed with the patient and are negative.  I have reviewed the past medical history, past surgical history, social history and family history with the patient and they are unchanged from previous note.  ALLERGIES:  is allergic to codeine and velosef.  MEDICATIONS:  Current Outpatient Prescriptions  Medication Sig Dispense Refill  . ENSURE PLUS (ENSURE PLUS) LIQD Take 237 mLs by mouth. 6  to 7 cans per day.    . ibuprofen (ADVIL,MOTRIN) 600 MG tablet Take 600 mg by mouth every 6 (six) hours as needed for headache.    . levothyroxine (SYNTHROID) 50 MCG tablet Take 1 tablet (50 mcg total) by mouth daily before  breakfast. 90 tablet 1  . levothyroxine (SYNTHROID, LEVOTHROID) 75 MCG tablet Take 75 mcg by mouth daily before breakfast.    . sodium fluoride (FLUORISHIELD) 1.1 % GEL dental gel Place 1 application onto teeth at bedtime.     No current facility-administered medications for this visit.    PHYSICAL EXAMINATION: ECOG PERFORMANCE STATUS: 0 - Asymptomatic  Filed Vitals:   10/29/14 0957  BP: 119/79  Pulse: 68  Temp: 98.1 F (36.7 C)  Resp: 18   Filed Weights   10/29/14 0957  Weight: 207 lb 4.8 oz (94.031 kg)    GENERAL:alert, no distress and comfortable SKIN: skin color, texture, turgor are normal, no rashes or significant lesions EYES: normal, Conjunctiva are pink and non-injected, sclera clear OROPHARYNX:no exudate, no erythema and lips, buccal mucosa, and tongue normal  NECK:  Noted lymphedema around his neck. LYMPH:  no palpable lymphadenopathy in the cervical, axillary or inguinal LUNGS: clear to auscultation and percussion with normal breathing effort HEART: regular rate & rhythm and no murmurs and no lower extremity edema ABDOMEN:abdomen soft, non-tender and normal bowel sounds Musculoskeletal:no cyanosis of digits and no clubbing  NEURO: alert & oriented x 3 with fluent speech, no focal motor/sensory deficits  LABORATORY DATA:  I have reviewed the data as listed    Component Value Date/Time   NA 142 10/27/2014 0955   NA 134* 11/27/2013 0813   K 4.5 10/27/2014 0955   K 4.1 11/27/2013 0813   CL 95* 11/27/2013 0813   CO2 29 10/27/2014 0955   CO2 28 11/27/2013 0813   GLUCOSE 98 10/27/2014 0955   GLUCOSE 105* 11/27/2013 0813   BUN 20.8 10/27/2014 0955   BUN 19 11/27/2013 0813   CREATININE 1.3 10/27/2014 0955   CREATININE 1.16 11/27/2013 0813   CALCIUM 9.3 10/27/2014 0955   CALCIUM 8.9 11/27/2013 0813   PROT 6.8 10/27/2014 0955   PROT 6.9 11/27/2013 0813   ALBUMIN 3.8 10/27/2014 0955   ALBUMIN 3.2* 11/27/2013 0813   AST 19 10/27/2014 0955   AST 13 11/27/2013  0813   ALT 22 10/27/2014 0955   ALT 10 11/27/2013 0813   ALKPHOS 65 10/27/2014 0955   ALKPHOS 61 11/27/2013 0813   BILITOT 0.87 10/27/2014 0955   BILITOT 0.6 11/27/2013 0813   GFRNONAA 40* 11/19/2013 1105   GFRAA 47* 11/19/2013 1105    No results found for: SPEP, UPEP  Lab Results  Component Value Date   WBC 6.9 10/27/2014   NEUTROABS 4.2 10/27/2014   HGB 15.4 10/27/2014   HCT 45.8 10/27/2014   MCV 96.1 10/27/2014   PLT 227 10/27/2014      Chemistry      Component Value Date/Time   NA 142 10/27/2014 0955   NA 134* 11/27/2013 0813   K 4.5 10/27/2014 0955   K 4.1 11/27/2013 0813   CL 95* 11/27/2013 0813   CO2 29 10/27/2014 0955   CO2 28 11/27/2013 0813   BUN 20.8 10/27/2014 0955   BUN 19 11/27/2013 0813   CREATININE 1.3 10/27/2014 0955   CREATININE 1.16 11/27/2013 0813      Component Value Date/Time   CALCIUM 9.3 10/27/2014 0955   CALCIUM 8.9 11/27/2013 0813   ALKPHOS 65 10/27/2014 0955  ALKPHOS 61 11/27/2013 0813   AST 19 10/27/2014 0955   AST 13 11/27/2013 0813   ALT 22 10/27/2014 0955   ALT 10 11/27/2013 0813   BILITOT 0.87 10/27/2014 0955   BILITOT 0.6 11/27/2013 0813       RADIOGRAPHIC STUDIES: I reviewed the CT scan with him and his wife I have personally reviewed the radiological images as listed and agreed with the findings in the report.   ASSESSMENT & PLAN:  Squamous cell carcinoma of the right base of tongue  Repeat imaging study showed no evidence of recurrence. Previously noted lymphadenopathy continues to improve. There is no role for routine imaging study in the future. I reinforced the importance of ENT follow-up. In 3 months, I will transition his care to cancer survivorship clinic.  Hypothyroidism (acquired)  His TSH remains elevated. I will increase levothyroxine from 50 g to 75 g. He would need recheck TSH in 3 months and dose adjustment  Dysphagia  He has persistent dysphagia. He is eating modified diet along with Ensure.  I  recommend consideration for esophageal dilatation in the future. I recommend he asks his ENT surgeon if this is possible.  Xerostomia He has persistent dry mouth from recent radiation treatment but it is improving. Encourage frequent oral fluid intake.     No orders of the defined types were placed in this encounter.   All questions were answered. The patient knows to call the clinic with any problems, questions or concerns. No barriers to learning was detected. I spent 20 minutes counseling the patient face to face. The total time spent in the appointment was 30 minutes and more than 50% was on counseling and review of test results     [facility], [place], MD 10/29/2014 12:38 PM

## 2014-10-29 NOTE — Assessment & Plan Note (Signed)
He has persistent dysphagia. He is eating modified diet along with Ensure.  I recommend consideration for esophageal dilatation in the future. I recommend he asks his ENT surgeon if this is possible.

## 2014-10-29 NOTE — Assessment & Plan Note (Signed)
His TSH remains elevated. I will increase levothyroxine from 50 g to 75 g. He would need recheck TSH in 3 months and dose adjustment

## 2014-10-29 NOTE — Assessment & Plan Note (Signed)
Repeat imaging study showed no evidence of recurrence. Previously noted lymphadenopathy continues to improve. There is no role for routine imaging study in the future. I reinforced the importance of ENT follow-up. In 3 months, I will transition his care to cancer survivorship clinic.

## 2014-10-30 ENCOUNTER — Telehealth: Payer: Self-pay | Admitting: *Deleted

## 2014-10-30 NOTE — Telephone Encounter (Signed)
CALLED PATIENT TO ASK QUESTION, LVM FOR A RETURN CALL 

## 2014-11-19 ENCOUNTER — Encounter: Payer: Self-pay | Admitting: *Deleted

## 2014-11-19 NOTE — Progress Notes (Signed)
Yvone Neu and his wife attended the Fall 2016 5-week H&N [facility] program (Tuesday evening sessions, 6:00-7:15 pm, [facility], beginning 10/21/14).  Gayleen Orem, RN, BSN, [place] at [place] [phone]

## 2014-12-11 ENCOUNTER — Other Ambulatory Visit: Payer: Self-pay | Admitting: Otolaryngology

## 2014-12-11 DIAGNOSIS — R1314 Dysphagia, pharyngoesophageal phase: Secondary | ICD-10-CM

## 2014-12-22 ENCOUNTER — Other Ambulatory Visit: Payer: Self-pay | Admitting: *Deleted

## 2014-12-22 MED ORDER — LEVOTHYROXINE SODIUM 75 MCG PO TABS: 75.0000 ug | ORAL_TABLET | Freq: Every day | ORAL | Status: DC

## 2014-12-29 ENCOUNTER — Other Ambulatory Visit: Payer: Self-pay

## 2014-12-30 ENCOUNTER — Ambulatory Visit
Admission: RE | Admit: 2014-12-30 | Discharge: 2014-12-30 | Disposition: A | Discharge status: Home / Self Care | Payer: BLUE CROSS/BLUE SHIELD | Source: Ambulatory Visit | Attending: Otolaryngology | Admitting: Otolaryngology

## 2014-12-30 DIAGNOSIS — R1314 Dysphagia, pharyngoesophageal phase: Secondary | ICD-10-CM

## 2015-01-07 ENCOUNTER — Encounter (HOSPITAL_BASED_OUTPATIENT_CLINIC_OR_DEPARTMENT_OTHER): Payer: Self-pay | Admitting: *Deleted

## 2015-01-08 ENCOUNTER — Ambulatory Visit: Payer: Self-pay | Admitting: Otolaryngology

## 2015-01-08 NOTE — H&P (Signed)
  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 Pharyngoesophageal dysphagia (787.24) (R13.14). History of radiation to head and neck region (V15.3) (Z92.3). Orders  Barium Swallow; Requested for: 08 Dec 2014. 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 Sometimes he has sticking or regurgitation of food after he tries to eat. On exam, the ears are clear and healthy. Oral cavity and pharynx are clear with some residual radiation mucositis but no asymmetry or evidence of any residual or recurrent tumor. There is no palpable adenopathy. There is mild to moderate neck soft tissue fibrosis.   Stable one year post treatment, no evidence of recurrence. Recommend barium swallow to evaluate the upper esophagus. Reason For Visit  Follow up tonsil cancer. Allergies  Codeine Derivatives; Adverse Reaction; Nausea and Vomiting Velosef CAPS; Allergy; Hives. Current Meds  Thyroid TABS;; RPT. Active Problems  Head injury   07 Jul 2006 (959.01) (S09.90XA); playing softball History of cancer tonsil   (V10.02) [id number]) Memory loss   07 Jul 2006 (780.93) (R41.3); head injury. 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 [place]  Central IV Line Type Port-A-Cath Gastric Surgery Feeding Gastrostomy Hand Surgery Oral Surgery. Family Hx  No pertinent family history: Mother. Personal Hx  Caffeine use (V49.89) (F15.90) Never a smoker No alcohol use. Signature  Electronically signed by : Izora Gala  M.D.;

## 2015-01-12 ENCOUNTER — Encounter (HOSPITAL_BASED_OUTPATIENT_CLINIC_OR_DEPARTMENT_OTHER): Payer: Self-pay

## 2015-01-12 ENCOUNTER — Ambulatory Visit (HOSPITAL_BASED_OUTPATIENT_CLINIC_OR_DEPARTMENT_OTHER): Payer: BLUE CROSS/BLUE SHIELD | Admitting: Anesthesiology

## 2015-01-12 ENCOUNTER — Ambulatory Visit (HOSPITAL_BASED_OUTPATIENT_CLINIC_OR_DEPARTMENT_OTHER)
Admission: RE | Admit: 2015-01-12 | Discharge: 2015-01-12 | Disposition: A | Discharge status: Home / Self Care | Payer: BLUE CROSS/BLUE SHIELD | Source: Ambulatory Visit | Attending: Otolaryngology | Admitting: Otolaryngology

## 2015-01-12 ENCOUNTER — Encounter (HOSPITAL_BASED_OUTPATIENT_CLINIC_OR_DEPARTMENT_OTHER)
Admission: RE | Disposition: A | Discharge status: Home / Self Care | Payer: Self-pay | Source: Ambulatory Visit | Attending: Otolaryngology

## 2015-01-12 DIAGNOSIS — G4730 Sleep apnea, unspecified: Secondary | ICD-10-CM | POA: Diagnosis not present

## 2015-01-12 DIAGNOSIS — R1314 Dysphagia, pharyngoesophageal phase: Secondary | ICD-10-CM | POA: Diagnosis present

## 2015-01-12 DIAGNOSIS — Z6833 Body mass index (BMI) 33.0-33.9, adult: Secondary | ICD-10-CM | POA: Diagnosis not present

## 2015-01-12 DIAGNOSIS — M1990 Unspecified osteoarthritis, unspecified site: Secondary | ICD-10-CM | POA: Insufficient documentation

## 2015-01-12 DIAGNOSIS — Z9221 Personal history of antineoplastic chemotherapy: Secondary | ICD-10-CM | POA: Insufficient documentation

## 2015-01-12 DIAGNOSIS — E039 Hypothyroidism, unspecified: Secondary | ICD-10-CM | POA: Insufficient documentation

## 2015-01-12 DIAGNOSIS — K222 Esophageal obstruction: Secondary | ICD-10-CM | POA: Diagnosis not present

## 2015-01-12 DIAGNOSIS — Z923 Personal history of irradiation: Secondary | ICD-10-CM | POA: Insufficient documentation

## 2015-01-12 DIAGNOSIS — Z85818 Personal history of malignant neoplasm of other sites of lip, oral cavity, and pharynx: Secondary | ICD-10-CM | POA: Diagnosis not present

## 2015-01-12 HISTORY — PX: ESOPHAGOSCOPY WITH DILITATION: SHX5618

## 2015-01-12 HISTORY — DX: Esophageal obstruction: K22.2

## 2015-01-12 HISTORY — PX: DIRECT LARYNGOSCOPY: SHX5326

## 2015-01-12 SURGERY — ESOPHAGOSCOPY, WITH DILATION
Anesthesia: General | Site: Throat

## 2015-01-12 MED ORDER — MEPERIDINE HCL 25 MG/ML IJ SOLN: 6.2500 mg | INTRAMUSCULAR | Status: DC | PRN

## 2015-01-12 MED ORDER — SUCCINYLCHOLINE CHLORIDE 20 MG/ML IJ SOLN
INTRAMUSCULAR | Status: DC | PRN
  Administered 2015-01-12 (×2): 50 mg via INTRAVENOUS

## 2015-01-12 MED ORDER — GLYCOPYRROLATE 0.2 MG/ML IJ SOLN: 0.2000 mg | Freq: Once | INTRAMUSCULAR | Status: DC | PRN

## 2015-01-12 MED ORDER — PROPOFOL 10 MG/ML IV BOLUS
INTRAVENOUS | Status: AC
  Filled 2015-01-12: qty 40

## 2015-01-12 MED ORDER — LACTATED RINGERS IV SOLN
INTRAVENOUS | Status: DC
  Administered 2015-01-12 (×2): via INTRAVENOUS

## 2015-01-12 MED ORDER — NALOXONE HCL 0.4 MG/ML IJ SOLN
INTRAMUSCULAR | Status: AC
  Filled 2015-01-12: qty 1

## 2015-01-12 MED ORDER — PROMETHAZINE HCL 25 MG/ML IJ SOLN: 6.2500 mg | INTRAMUSCULAR | Status: DC | PRN

## 2015-01-12 MED ORDER — FENTANYL CITRATE (PF) 100 MCG/2ML IJ SOLN
INTRAMUSCULAR | Status: DC | PRN
  Administered 2015-01-12: 100 ug via INTRAVENOUS

## 2015-01-12 MED ORDER — DEXAMETHASONE SODIUM PHOSPHATE 4 MG/ML IJ SOLN
INTRAMUSCULAR | Status: DC | PRN
  Administered 2015-01-12: 10 mg via INTRAVENOUS

## 2015-01-12 MED ORDER — MIDAZOLAM HCL 2 MG/2ML IJ SOLN: 1.0000 mg | INTRAMUSCULAR | Status: DC | PRN

## 2015-01-12 MED ORDER — EPINEPHRINE HCL 1 MG/ML IJ SOLN
INTRAMUSCULAR | Status: AC
  Filled 2015-01-12: qty 1

## 2015-01-12 MED ORDER — LIDOCAINE HCL (CARDIAC) 20 MG/ML IV SOLN
INTRAVENOUS | Status: AC
  Filled 2015-01-12: qty 5

## 2015-01-12 MED ORDER — FENTANYL CITRATE (PF) 100 MCG/2ML IJ SOLN
INTRAMUSCULAR | Status: AC
  Filled 2015-01-12: qty 2

## 2015-01-12 MED ORDER — PROPOFOL 10 MG/ML IV BOLUS
INTRAVENOUS | Status: DC | PRN
  Administered 2015-01-12: 200 mg via INTRAVENOUS

## 2015-01-12 MED ORDER — ONDANSETRON HCL 4 MG/2ML IJ SOLN
INTRAMUSCULAR | Status: DC | PRN
  Administered 2015-01-12: 4 mg via INTRAVENOUS

## 2015-01-12 MED ORDER — ONDANSETRON HCL 4 MG/2ML IJ SOLN
INTRAMUSCULAR | Status: AC
  Filled 2015-01-12: qty 2

## 2015-01-12 MED ORDER — SCOPOLAMINE 1 MG/3DAYS TD PT72: 1.0000 | MEDICATED_PATCH | Freq: Once | TRANSDERMAL | Status: DC | PRN

## 2015-01-12 MED ORDER — FENTANYL CITRATE (PF) 100 MCG/2ML IJ SOLN: 50.0000 ug | INTRAMUSCULAR | Status: DC | PRN

## 2015-01-12 MED ORDER — MIDAZOLAM HCL 2 MG/2ML IJ SOLN: 0.5000 mg | Freq: Once | INTRAMUSCULAR | Status: DC | PRN

## 2015-01-12 MED ORDER — FENTANYL CITRATE (PF) 100 MCG/2ML IJ SOLN: 25.0000 ug | INTRAMUSCULAR | Status: DC | PRN

## 2015-01-12 MED ORDER — MIDAZOLAM HCL 2 MG/2ML IJ SOLN
INTRAMUSCULAR | Status: AC
  Filled 2015-01-12: qty 2

## 2015-01-12 MED ORDER — DEXAMETHASONE SODIUM PHOSPHATE 10 MG/ML IJ SOLN
INTRAMUSCULAR | Status: AC
  Filled 2015-01-12: qty 1

## 2015-01-12 MED ORDER — MIDAZOLAM HCL 5 MG/5ML IJ SOLN
INTRAMUSCULAR | Status: DC | PRN
  Administered 2015-01-12: 2 mg via INTRAVENOUS

## 2015-01-12 SURGICAL SUPPLY — 21 items
CANISTER SUCT 1200ML W/VALVE (MISCELLANEOUS) ×2 IMPLANT
CONT SPEC 4OZ CLIKSEAL STRL BL (MISCELLANEOUS) IMPLANT
GLOVE ECLIPSE 7.5 STRL STRAW (GLOVE) ×2 IMPLANT
GOWN STRL REUS W/ TWL LRG LVL3 (GOWN DISPOSABLE) IMPLANT
GOWN STRL REUS W/ TWL XL LVL3 (GOWN DISPOSABLE) IMPLANT
GOWN STRL REUS W/TWL LRG LVL3 (GOWN DISPOSABLE) ×2
GOWN STRL REUS W/TWL XL LVL3 (GOWN DISPOSABLE) ×2
GUARD TEETH (MISCELLANEOUS) ×1 IMPLANT
NDL HYPO 18GX1.5 BLUNT FILL (NEEDLE) ×1 IMPLANT
NDL SPNL 22GX7 QUINCKE BK (NEEDLE) IMPLANT
NEEDLE HYPO 18GX1.5 BLUNT FILL (NEEDLE) ×2 IMPLANT
NEEDLE SPNL 22GX7 QUINCKE BK (NEEDLE) IMPLANT
NS IRRIG 1000ML POUR BTL (IV SOLUTION) ×2 IMPLANT
PATTIES SURGICAL .5 X3 (DISPOSABLE) ×2 IMPLANT
SHEET MEDIUM DRAPE 40X70 STRL (DRAPES) ×2 IMPLANT
SPONGE GAUZE 4X4 12PLY STER LF (GAUZE/BANDAGES/DRESSINGS) ×4 IMPLANT
SURGILUBE 2OZ TUBE FLIPTOP (MISCELLANEOUS) ×3 IMPLANT
SYR 5ML LL (SYRINGE) ×2 IMPLANT
SYR CONTROL 10ML LL (SYRINGE) IMPLANT
TOWEL OR 17X24 6PK STRL BLUE (TOWEL DISPOSABLE) ×2 IMPLANT
TUBE CONNECTING 20X1/4 (TUBING) ×2 IMPLANT

## 2015-01-12 NOTE — Anesthesia Preprocedure Evaluation (Addendum)
Anesthesia Evaluation  Patient identified by MRN, date of birth, ID band Patient awake    Reviewed: Allergy & Precautions, NPO status , Patient's Chart, lab work & pertinent test results  History of Anesthesia Complications Negative for: history of anesthetic complications  Airway Mallampati: II  TM Distance: >3 FB Neck ROM: Full    Dental  (+) Edentulous Upper, Missing, Poor Dentition, Dental Advisory Given, Chipped   Pulmonary sleep apnea and Continuous Positive Airway Pressure Ventilation ,    breath sounds clear to auscultation       Cardiovascular (-) anginanegative cardio ROS   Rhythm:Regular Rate:Normal     Neuro/Psych negative neurological ROS     GI/Hepatic negative GI ROS, Neg liver ROS,   Endo/Other  Hypothyroidism Morbid obesity  Renal/GU negative Renal ROS     Musculoskeletal  (+) Arthritis , Osteoarthritis,    Abdominal (+) + obese,   Peds  Hematology negative hematology ROS (+)   Anesthesia Other Findings Cancer base of tongue: chemo, XRT  Reproductive/Obstetrics                            Anesthesia Physical Anesthesia Plan  ASA: III  Anesthesia Plan: General   Post-op Pain Management:    Induction: Intravenous  Airway Management Planned: Oral ETT  Additional Equipment:   Intra-op Plan:   Post-operative Plan: Extubation in OR  Informed Consent: I have reviewed the patients History and Physical, chart, labs and discussed the procedure including the risks, benefits and alternatives for the proposed anesthesia with the patient or authorized representative who has indicated his/her understanding and acceptance.   Dental advisory given  Plan Discussed with: CRNA and Surgeon  Anesthesia Plan Comments: (Plan routine monitors, GETA)        Anesthesia Quick Evaluation

## 2015-01-12 NOTE — Anesthesia Postprocedure Evaluation (Signed)
Anesthesia Post Note  Patient: [name] Wu  Procedure(s) Performed: Procedure(s) (LRB): ESOPHAGOSCOPY WITH DILITATION (N/A) DIRECT LARYNGOSCOPY (N/A)  Patient location during evaluation: PACU Anesthesia Type: General Level of consciousness: awake and alert, oriented and patient cooperative Pain management: pain level controlled Vital Signs Assessment: post-procedure vital signs reviewed and stable Respiratory status: spontaneous breathing, nonlabored ventilation and respiratory function stable Cardiovascular status: blood pressure returned to baseline and stable Postop Assessment: no signs of nausea or vomiting Anesthetic complications: no    Last Vitals:  Filed Vitals:   01/12/15 0830 01/12/15 0856  BP: 118/85 115/84  Pulse: 64 67  Temp:  37.1 C  Resp: 12 16    Last Pain: There were no vitals filed for this visit.               Midge Minium

## 2015-01-12 NOTE — Op Note (Signed)
OPERATIVE REPORT  DATE OF SURGERY: 01/12/2015  PATIENT:  [name] Wu,  59 y.o. male  PRE-OPERATIVE DIAGNOSIS:  esophageal stenosis  POST-OPERATIVE DIAGNOSIS:  esophageal stenosis  PROCEDURE:  Procedure(s): ESOPHAGOSCOPY WITH DILITATION  SURGEON:  Beckie Salts, MD  ASSISTANTS: none  ANESTHESIA:   General   EBL:  0 ml  DRAINS: none  LOCAL MEDICATIONS USED:  None  SPECIMEN:  none  COUNTS:  Correct  PROCEDURE DETAILS: The patient was taken to the operating room and placed on the operating table in the supine position. Following induction of general endotracheal anesthesia, the table was turned 90 and the patient was draped in a standard fashion. A maxillary tooth protector was used throughout the case. He has a small mouth with upper and lower dentition, limited jaw mobility and limited neck mobility. I attempted to pass a cervical esophagoscope but was unable to because of the angle and the restricted mobility. I then used a Jako laryngoscope to examine the hypopharynx and attached the scope to the suspension apparatus on the Mayo stand. I was unable to visualize the larynx. I bluntly past soft tapered esophageal dilators starting at [address] [place] and working up towards [place]. These passed smoothly. The 33 was the largest that I could pass. I then passed the nasogastric tube and aspirated the stomach contents. The scope was removed and the patient was awakened, extubated and transferred to recovery in stable condition.    PATIENT DISPOSITION:  To PACU, stable

## 2015-01-12 NOTE — H&P (View-Only) (Signed)
  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 Pharyngoesophageal dysphagia (787.24) (R13.14). History of radiation to head and neck region (V15.3) (Z92.3). Orders  Barium Swallow; Requested for: 08 Dec 2014. 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 Sometimes he has sticking or regurgitation of food after he tries to eat. On exam, the ears are clear and healthy. Oral cavity and pharynx are clear with some residual radiation mucositis but no asymmetry or evidence of any residual or recurrent tumor. There is no palpable adenopathy. There is mild to moderate neck soft tissue fibrosis.   Stable one year post treatment, no evidence of recurrence. Recommend barium swallow to evaluate the upper esophagus. Reason For Visit  Follow up tonsil cancer. Allergies  Codeine Derivatives; Adverse Reaction; Nausea and Vomiting Velosef CAPS; Allergy; Hives. Current Meds  Thyroid TABS;; RPT. Active Problems  Head injury   07 Jul 2006 (959.01) (S09.90XA); playing softball History of cancer tonsil   (V10.02) [id number]) Memory loss   07 Jul 2006 (780.93) (R41.3); head injury. 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 [place]  Central IV Line Type Port-A-Cath Gastric Surgery Feeding Gastrostomy Hand Surgery Oral Surgery. Family Hx  No pertinent family history: Mother. Personal Hx  Caffeine use (V49.89) (F15.90) Never a smoker No alcohol use. Signature  Electronically signed by : Izora Gala  M.D.;

## 2015-01-12 NOTE — Discharge Instructions (Signed)
No changes    Post Anesthesia Home Care Instructions  Activity: Get plenty of rest for the remainder of the day. A responsible adult should stay with you for 24 hours following the procedure.  For the next 24 hours, DO NOT: -Drive a car -Paediatric nurse -Drink alcoholic beverages -Take any medication unless instructed by your physician -Make any legal decisions or sign important papers.  Meals: Start with liquid foods such as gelatin or soup. Progress to regular foods as tolerated. Avoid greasy, spicy, heavy foods. If nausea and/or vomiting occur, drink only clear liquids until the nausea and/or vomiting subsides. Call your physician if vomiting continues.  Special Instructions/Symptoms: Your throat may feel dry or sore from the anesthesia or the breathing tube placed in your throat during surgery. If this causes discomfort, gargle with warm salt water. The discomfort should disappear within 24 hours.  If you had a scopolamine patch placed behind your ear for the management of post- operative nausea and/or vomiting:  1. The medication in the patch is effective for 72 hours, after which it should be removed.  Wrap patch in a tissue and discard in the trash. Wash hands thoroughly with soap and water. 2. You may remove the patch earlier than 72 hours if you experience unpleasant side effects which may include dry mouth, dizziness or visual disturbances. 3. Avoid touching the patch. Wash your hands with soap and water after contact with the patch.

## 2015-01-12 NOTE — Anesthesia Procedure Notes (Signed)
Procedure Name: Intubation Date/Time: 01/12/2015 7:29 AM Performed by: Marrianne Mood Pre-anesthesia Checklist: Patient identified, Emergency Drugs available, Suction available, Patient being monitored and Timeout performed Patient Re-evaluated:Patient Re-evaluated prior to inductionOxygen Delivery Method: Circle System Utilized Preoxygenation: Pre-oxygenation with 100% oxygen Intubation Type: IV induction Ventilation: Mask ventilation without difficulty Laryngoscope Size: Miller and 3 Grade View: Grade IV Tube type: Oral Tube size: 6.5 mm Number of attempts: 1 Airway Equipment and Method: Stylet and Oral airway Placement Confirmation: ETT inserted through vocal cords under direct vision,  positive ETCO2 and breath sounds checked- equal and bilateral Tube secured with: Tape Dental Injury: Teeth and Oropharynx as per pre-operative assessment

## 2015-01-12 NOTE — Transfer of Care (Signed)
Immediate Anesthesia Transfer of Care Note  Patient: [name] Wu  Procedure(s) Performed: Procedure(s): ESOPHAGOSCOPY WITH DILITATION (N/A) DIRECT LARYNGOSCOPY (N/A)  Patient Location: PACU  Anesthesia Type:General  Level of Consciousness: awake and patient cooperative  Airway & Oxygen Therapy: Patient Spontanous Breathing and Patient connected to face mask oxygen  Post-op Assessment: Report given to RN and Post -op Vital signs reviewed and stable  Post vital signs: Reviewed and stable  Last Vitals:  Filed Vitals:   01/12/15 0814 01/12/15 0815  BP:  122/94  Pulse: 71 68  Temp: 36.7 C   Resp:  11    Complications: No apparent anesthesia complications

## 2015-01-12 NOTE — Interval H&P Note (Signed)
History and Physical Interval Note:  01/12/2015 7:14 AM  [name] Wu  has presented today for surgery, with the diagnosis of esophageal stenosis  The various methods of treatment have been discussed with the patient and family. After consideration of risks, benefits and other options for treatment, the patient has consented to  Procedure(s): ESOPHAGOSCOPY WITH DILITATION (N/A) as a surgical intervention .  The patient's history has been reviewed, patient examined, no change in status, stable for surgery.  I have reviewed the patient's chart and labs.  Questions were answered to the patient's satisfaction.     ROSEN, JEFRY

## 2015-01-13 ENCOUNTER — Encounter (HOSPITAL_BASED_OUTPATIENT_CLINIC_OR_DEPARTMENT_OTHER): Payer: Self-pay | Admitting: Otolaryngology

## 2015-01-20 ENCOUNTER — Ambulatory Visit: Payer: BLUE CROSS/BLUE SHIELD | Admitting: Radiation Oncology

## 2015-01-20 ENCOUNTER — Ambulatory Visit: Payer: BLUE CROSS/BLUE SHIELD | Attending: Radiation Oncology | Admitting: Radiation Oncology

## 2015-01-21 ENCOUNTER — Ambulatory Visit: Payer: BLUE CROSS/BLUE SHIELD | Admitting: Radiation Oncology

## 2015-01-21 ENCOUNTER — Ambulatory Visit
Admission: RE | Admit: 2015-01-21 | Discharge: 2015-01-21 | Disposition: A | Discharge status: Home / Self Care | Payer: BLUE CROSS/BLUE SHIELD | Source: Ambulatory Visit | Attending: Radiation Oncology | Admitting: Radiation Oncology

## 2015-01-21 ENCOUNTER — Encounter: Payer: Self-pay | Admitting: Radiation Oncology

## 2015-01-21 VITALS — BP 217/80 | HR 98 | Temp 98.3°F | Ht 65.5 in | Wt 200.3 lb

## 2015-01-21 DIAGNOSIS — C01 Malignant neoplasm of base of tongue: Secondary | ICD-10-CM

## 2015-01-21 NOTE — Progress Notes (Signed)
CC: Dr. 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  Earlier this month he had dilatation of his esophageal stenosis by Dr. Constance Holster.  He is advancing his diet and he is pleased with his progress.  He remains active working full time.  His weight is down 7 pounds over the past 3 months and this has been intentional.  He was released by Dr. Alvy Bimler.  He is on thyroid supplementation.  Physical examination: Alert and oriented. Filed Vitals:   01/21/15 0907  BP: 217/80  Pulse: 98  Temp: 98.3 F (36.8 C)   Nodes: There is no palpable lymphadenopathy in the neck.  His fibrosis along the anterior neck/chin is unchanged.  Oral cavity and oropharynx are unremarkable to inspection with scarring seen along his right oropharynx.  He does have mild xerostomia.  Get reflex is not as severe.  Indirect mirror examination not performed today since he recently had endoscopy.  Impression: No evidence for persistent/recurrent disease.  With my retirement, I offered [name] Wu alternate follow-up visits with one of my colleagues and Dr. Constance Holster, or simply be followed by Dr. Constance Holster alone.  He chooses to be followed by Dr. Constance Holster alone.  Everardo Beals NP will need to assume his routine follow-up medical care.  He is also a candidate for survivorship, and I will copy [name] NP to set this up if this has not been scheduled through Dr. Alvy Bimler.

## 2015-01-21 NOTE — Progress Notes (Signed)
[name] Wu here fro reassessment s/p XRT for right Oropharyngeal cancer.  He had dilatation of his esophagus on 01/12/15 and is presently drinking 5 Ensure daily with 1 milkshake and was recently able to eat small portions of cube steak and chicken with gravy.  Has lost 7 lbs since 10/29/14  BP 217/80 mmHg  Pulse 98  Temp(Src) 98.3 F (36.8 C)  Ht 5' 5.5" (1.664 m)  Wt 200 lb 4.8 oz (90.855 kg)  BMI 32.81 kg/m2

## 2015-01-22 ENCOUNTER — Telehealth: Payer: Self-pay | Admitting: Adult Health

## 2015-01-22 ENCOUNTER — Other Ambulatory Visit: Payer: Self-pay | Admitting: Adult Health

## 2015-01-22 DIAGNOSIS — E039 Hypothyroidism, unspecified: Secondary | ICD-10-CM

## 2015-01-22 DIAGNOSIS — C01 Malignant neoplasm of base of tongue: Secondary | ICD-10-CM

## 2015-01-22 NOTE — Telephone Encounter (Signed)
I spoke with Mr. Koob wife, Joseph Art, and we discussed the opportunity for the patient to come see me in the survivorship clinic.  Per Mrs. Bonnici, the patient is out of work the week after Christmas, so we have scheduled an appt for them to come in during that time.   I will see Mr. Fuertes on 02/03/15 with labs (TSH) before the visit with me.  I will review with them a survivorship care plan for survivors of head & neck cancer.  I gave Ms. Biddy my direct office number to call me with any questions or concerns before their scheduled appt here.  I look forward to participating in Mr. Tessmer's care.   [name], NP [place] [phone]

## 2015-01-23 ENCOUNTER — Encounter: Payer: Self-pay | Admitting: Adult Health

## 2015-01-23 NOTE — Progress Notes (Signed)
Survivorship Care Plan  Provided by [name], NP on 02/03/2015    General Information  Patient name [name] Wu   Patient ID [id number]   Date of birth 01/10/1956     Your Care Team  Patient Care Team: Everardo Beals, NP as PCP - General Izora Gala, MD as Consulting Physician (Otolaryngology) Heath Lark, MD as Consulting Physician (Hematology and Oncology) Leota Sauers, RN as Oncology Nurse Navigator Arloa Koh, MD - Radiation Oncologist Teena Dunk, DDS - Dentist Gayleen Orem, RN - Nurse Navigator Garald Balding, SLP - Speech Therapist Serafina Royals, PT - Physical Therapist  Polo Riley, LCSW - Social Worker Dory Peru, [place] - Dietitian [name], NP - Survivorship Nurse Practitioner   *To reach your [facility] providers, call [phone].   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 There are right level 2 and level 3  adenopathy as described and right peritracheal lymph node within the superior mediastinum   09/12/2013 Surgery Laryngoscopy showed the oropharynx revealed a firm friable mass involving the lateral base of tongue and inferior tonsillar fossa.    09/13/2013 Pathology Results Accession [id number]:  Biopsy confirmed squamous cell carcinoma, HPV positive.   09/25/2013 Imaging PET scan showed right base of tongue mass exhibits intense FDG uptake compatible with primary head neck carcinoma with associated hypermetabolic right level to adenopathy   10/03/2013 Procedure He had dental extraction   10/17/2013 Procedure Placement of G-tube and port-a-cath Barry Dienes).    10/21/2013 - 12/06/2013 Chemotherapy He start on high dose cisplatin. Dose number 3 reduced 50% due to side-effects   11/04/2013 - 12/23/2013 Radiation Therapy Received Helicle IMRT Tomotherapy:  Right oropharynx 7000 cGy in 35 sessions, right neck adenopathy 7000 cGy in 35 sessions.    High risk nodal bed 5950 cGy in 35 sessions, and low risk nodal bed 5600 cGy in 35 sessions.        11/19/2013 Procedure He had placement of right PICC line and removal of port   12/16/2013 Procedure The balloon in the feeding tube ruptured and the feeding tube fell out. It was replaced   04/17/2014 Imaging Re-staging PET showed that the right tongue base hypermetabolic activity has essentially resolved. There is a small mild hypermetabolic LN that needs to be monitored with CT neck  07/24/2014 Procedure PEG removed.   10/27/2014 Imaging CT Neck w/ contrast:  1. Satisfactory post tmt CT appearance of  neck.  No residual tongue base tumor is evident. Maximal residual R level 3 nodal tissue.  2. Regressed R peritracheal lymph node in the superior mediastinum, likely inflammatory in nature.   01/12/2015 Procedure Esophageal dilitation and direct laryngoscopy performed Constance Holster).            Treatment goal Curative                    RADIATION THERAPY:    Location of Radiation Right oropharynx & right neck  Radiation Period  Start Date: 11/04/13               End Date: 12/23/13  Number of Radiation Treatments 35 treatments  Total Radiation Dose Right oropharynx: 70 Gy Right neck adenopathy (enlarged lymph nodes): 70 Gy  Neck lymph nodes (high risk): 59.5 Gy Neck lymph nodes (intermediate risk): 56 Gy   Potential Late & Long-Term Side Effects of Head & Neck Radiation Therapy   Fatigue  Dry mouth  Altered taste or loss of taste  Trouble swallowing  Jaw muscle spasms/pain  Skin changes in the area treated with radiation  Neck muscle spasms/pain  Lymphedema (swelling in the face and/or neck)  Hypothyroidism (sluggish thyroid gland)  *Side effects from radiation therapy are dependent on the areas of the body in the treatment field.  These are the most common late/long-term effects for patients treated for base of tongue cancer.                            CHEMOTHERAPY: Chemotherapy Given With Radiation Therapy Yes   Drugs Received Date Started Date Completed # cycles (number of times drug was received) Comments  [x]   Cisplatin  10/21/13  12/06/13 3 Cycle #3 was dose-reduced due to side effects.   Potential Late & Long-Term Side Effects of Chemotherapy   Fatigue  Neuropathy (numbness/tingling in hands or feet)  Hearing loss (Cisplatin only)  Infertility  Kidney damage/kidney failure  Liver problems  Rare secondary cancers      NUTRITIONAL SUPPORT: Placement of Feeding Tube Yes  Date of Placement 10/17/13  Date of Removal 07/24/14                      Survivorship Care & Follow-up Schedule    How often will I see my cancer providers: Survivor Years 0-2  (Survivor Years defined by length of time since cancer diagnosis)  When?  Responsible Provider  Otolaryngologist (ENT) Every 3-6 months  Dr. Constance Holster  Survivorship Nurse Practitioner (NP) At the completion of treatment  [name], NP  Lab tests (TSH) At least once per year; more often if adjustments made to thyroid medication [name], NP or Dr. Constance Holster  Dentist 3-4 times per year Dr. Enrique Sack or your Primary Care Dentist  Nurse Navigator As needed Gayleen Orem, RN  Dietitian As needed Dory Peru, RD  Physical Therapist As needed Serafina Royals, PT  Speech Therapist As needed Garald Balding, SLP  Social Worker As needed Polo Riley, LCSW       How often will I see my cancer providers: Survivor Years 2-5 (Survivor Years defined by length of time since cancer diagnosis)  When?  Responsible Provider  Otolaryngology (ENT) visits Every 6 months Dr. Constance Holster  Lab tests (TSH) At least once per year; more often if  adjustments made to thyroid medication Dr. Constance Holster or [name], NP  Dentist As directed Dr. Enrique Sack or your Primary Care Dentist   Survivorship Nurse Practitioner (NP) Every 6 months, alternating with ENT visits [name], NP   Until 5 Years: GRADUATION!  [name], NP           Common Complaints/Concerns of Cancer Survivors  *Patients often worry if what they are experiencing is normal, or to be expected, given their cancer history and treatment.  Below are common complaints & concerns reported by cancer survivors. If you are concerned about symptoms you are experiencing, please report all symptoms to your cancer care team!    Common Complaints/Concerns for Head & Neck Cancer Survivors   Fatigue  Memory problems and/or confusion  Depression  Anxiety  Dry mouth  Difficulty swallowing  Changes in your eating habits, your taste, or your smell  Weight changes  Hypothyroidism ("sluggish" thyroid)  Insomnia or trouble sleeping  Lymphedema (swelling in your neck or face)  Decreased range of motion (difficulty moving neck)  Intimacy and sexuality  Marital/partner/family relationships   Employment, health insurance concerns, and/or finances  Concerns  regarding spirituality  Exercise after cancer treatments                            Symptoms to Watch For and Report to Your Provider  *Often cancer survivors are fearful about their cancer coming back or being diagnosed with a new cancer.  Below are symptoms that you and your loved ones should watch for and report to your provider.   General Symptoms to Watch For and Report to Your Provider .  Return of the cancer symptoms you had before- such as a lump or new growth where your cancer first started . New or unusual pain that seems unrelated to an injury and does not go away, including back pain or bone pain . Weight loss without trying/intending . Unexplained bleeding . A rash or allergic reaction, such as swelling, severe itching, or wheezing . Chills or fevers . Persistent headaches . Shortness of breath or difficulty breathing . Bloody stools or blood in your urine . Lumps, bumps, swelling and/or nipple discharge . Nausea, vomiting, diarrhea, loss of appetite, or trouble swallowing . A cough that doesn't go away . Abdominal pain . Swelling in your arms or legs . Fractures . Any other signs mentioned by your doctor or nurse or any unusual symptoms                 that you just can't explain   NOTE: Just because you have certain symptoms, it doesn't mean the cancer has come back or you have a new cancer. Symptoms can be due to other problems that need to be addressed.  It is important to watch for these symptoms and report them to your provider so you can be medically evaluated for any of these concerns!      What Now?  Thriving & Surviving In Your Life After Cancer   Consider participating in "Finding Your New Normal" [facility]) survivorship series.   [employer] fitness program for cancer survivors.   Keep your follow-up appointments with all of your specialists (Speech Therapist, Dietician, Physical Therapist, Dentist, etc.)  Consider FREE counseling  at cancer center.  For more information, contact Ottis Stain at [phone].  Attend support group and other Patient & [place] class/program offerings.   Stop smoking or continue  to abstain from smoking. If you need help with smoking cessation, talk to your healthcare team about different options to help you!  Limit alcohol consumption or abstain from consuming alcohol all together.   Maintain adequate nutrition & fluid intake      Thank you so much for allowing [facility] to care for you during your cancer experience.  Our continued commitment is to you and your caregiver(s) health and happiness and again, congratulations on completing treatment & transitioning to survivorship!     Survivorship Nurse Practitioner contact:  [name], NP Survivorship Program [place] [address] [place], [place] [address] [phone]

## 2015-02-03 ENCOUNTER — Encounter: Payer: Self-pay | Admitting: Adult Health

## 2015-02-03 ENCOUNTER — Telehealth: Payer: Self-pay | Admitting: Adult Health

## 2015-02-03 ENCOUNTER — Other Ambulatory Visit (HOSPITAL_BASED_OUTPATIENT_CLINIC_OR_DEPARTMENT_OTHER): Payer: BLUE CROSS/BLUE SHIELD

## 2015-02-03 ENCOUNTER — Other Ambulatory Visit: Payer: Self-pay | Admitting: Adult Health

## 2015-02-03 ENCOUNTER — Ambulatory Visit (HOSPITAL_BASED_OUTPATIENT_CLINIC_OR_DEPARTMENT_OTHER): Payer: BLUE CROSS/BLUE SHIELD | Admitting: Adult Health

## 2015-02-03 VITALS — BP 108/76 | HR 78 | Temp 97.9°F | Resp 14 | Wt 200.8 lb

## 2015-02-03 DIAGNOSIS — C01 Malignant neoplasm of base of tongue: Secondary | ICD-10-CM

## 2015-02-03 DIAGNOSIS — E039 Hypothyroidism, unspecified: Secondary | ICD-10-CM | POA: Diagnosis not present

## 2015-02-03 DIAGNOSIS — G620 Drug-induced polyneuropathy: Secondary | ICD-10-CM | POA: Diagnosis not present

## 2015-02-03 LAB — TSH: TSH: 4.196 m(IU)/L — ABNORMAL HIGH (ref 0.320–4.118)

## 2015-02-03 MED ORDER — LEVOTHYROXINE SODIUM 88 MCG PO TABS: 88.0000 ug | ORAL_TABLET | Freq: Every day | ORAL | Status: DC

## 2015-02-03 NOTE — Telephone Encounter (Signed)
error 

## 2015-02-03 NOTE — Progress Notes (Signed)
CLINIC:  Cancer Survivorship [facility]   REASON FOR VISIT:  Routine follow-up survivorship visit for a recent history of head & neck cancer.  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 There are right level 2 and level 3 adenopathy as described and right peritracheal lymph node within the superior mediastinum   09/12/2013 Surgery Laryngoscopy showed the oropharynx revealed a firm friable mass involving the lateral base of tongue and inferior tonsillar fossa.    09/13/2013 Pathology Results Accession [id number]:  Biopsy confirmed squamous cell carcinoma, HPV positive.   09/25/2013 Imaging PET scan showed right base of tongue mass exhibits intense FDG uptake compatible with primary head neck carcinoma with associated hypermetabolic right level to adenopathy   10/03/2013 Procedure He had dental extraction   10/17/2013 Procedure Placement of G-tube and port-a-cath Barry Dienes).    10/21/2013 - 12/06/2013 Chemotherapy He start on high dose cisplatin. Dose number 3 reduced 50% due to side-effects   11/04/2013 - 12/23/2013 Radiation Therapy Received Helicle IMRT Tomotherapy:  Right oropharynx 7000 cGy in 35 sessions, right neck adenopathy 7000 cGy in 35 sessions. High risk nodal bed 5950 cGy in 35 sessions, and low risk nodal bed 5600 cGy in 35 sessions.   11/19/2013 Procedure He had placement of right PICC line and removal of port   12/16/2013 Procedure The balloon in the feeding tube ruptured and the feeding tube fell out. It  was replaced   04/17/2014 Imaging Re-staging PET showed that the right tongue base hypermetabolic activity has essentially resolved. There is a small mild hypermetabolic LN that needs to be monitored with CT neck   07/24/2014 Procedure PEG removed.   10/27/2014 Imaging CT Neck w/ contrast:  1. Satisfactory post tmt CT appearance of  neck.  No residual tongue base tumor is evident. Maximal residual R level 3 nodal tissue.  2. Regressed R peritracheal lymph node in the superior mediastinum, likely inflammatory in nature.   01/12/2015 Procedure Esophageal dilitation and direct laryngoscopy performed Constance Holster).     INTERVAL HISTORY:  Mr. Buetow presents to the [facility] today for our initial meeting to review the survivorship care plan detailing his treatment course for head & neck cancer, as well as monitoring long-term side effects of that treatment, education regarding health maintenance, screening, and overall wellness and health promotion.     Overall, Mr. Alldredge reports feeling overall quite well since completing his concurrent radiation/chemotherapy with Cisplatin a little over 1 year ago.  He has no pain. He has lost about 7 lbs since 10/2014, but weight has remained stable since then at ~200 lbs. He recently underwent esophageal dilation for treatment-related esophageal stricture earlier this month.  Mr. Goley hasn't started eating very much soft/solid foods since the dilation.    Pain:  None   Nutritional Status:  -Intake/Diet: Drinking about 5 Boosts per day and 1 milkshake. Minimal solid food intake; solid food "gums up in my mouth because I don't have enough saliva."  -  Using a feeding tube? No -Weight change: Stable since 01/21/15. Loss ~ 7 lbs since 10/2014  Swallowing:   Improved since esophageal dilation on 01/12/15.   Last ENT visit:  01/2015 Constance Holster)  Last Dentist visit:   12/2014 with primary care dentist-reports having 1 new cavity  Summary of last Med Onc visit:  10/29/14 Alvy Bimler):  No evidence of recurrence. Transition care to survivorship and ENT.   Summary of last Rad Onc visit:   01/21/15 Valere Dross): No evidence of recurrence.  Will get surveillance with Dr. Constance Holster and/or survivorship.   Last TSH value:  10/29/14-TSH 6.927 [Gorsuch increased Synthroid from 55 mcg to 75 mcg]  Last Imaging:   10/27/14-CT neck: 1. Satisfactory post tmt CT appearance of  neck.  No residual tongue base tumor is evident. Maximal residual R level 3 nodal tissue.  2. Regressed R peritracheal lymph node in the superior mediastinum, likely inflammatory in nature.   ADDITIONAL REVIEW OF SYSTEMS:  General: Endorses some fatigue; "But I stay busy all the time working and doing manual labor like splitting wood that makes me tired."   HEENT/Neck: Denies any new lumps/bumps to head or neck.  Neck lymphedema present but not any worse than previous per patient.   Endocrine: Denies cold/heat intolerance, constipation, depression. Cardiac: Denieschest pain, and lower extremity edema.  GI: Denies constipation,. GU: Denies dysuria. Neuro: Endorses mild peripheral neuropathy to bilateral feet; not constant, not overly bothersome to patient. "I keep busy and I don't really notice it."  Skin: Denies rash, pruritis, or open wounds.  Psych: Denies depression, anxiety.  A 14-point review of systems was completed and was negative, except as noted above.   ONCOLOGY TREATMENT TEAM:  1. ENT: Dr. Constance Holster 2. Medical Oncologist: Dr. Alvy Bimler  3. Radiation Oncologist: Dr. Isidore Moos    PAST MEDICAL/SURGICAL HISTORY:  Past Medical History  Diagnosis Date  . Amnesia     after being hitting head playing baseball, lasted 1 night  . Tuberculosis 1984    S/P military stay in [place]; [place] x 66yrs; "cleared"  . History of stomach ulcers ~ 1983  . Malignant neoplasm of base of tongue ([place]) 09/13/13    right base of tongue- inv squamous cell   . History of radiation therapy 10/27/13- 12/23/13    R oropharynx 7000 cGy, 35 fx, R neck  adenopathy 7000 cGy 35 fx, high risk nodal bed 5950 cGy 35 fx, low risk nodal bed 5600 cGy 35 fx  . Sinusitis 02/03/2014  . Other fatigue 04/23/2014  . S/P radiation therapy 10/27/2013 through 12/23/2013     Right oropharynx 7000 cGy in 35 sessions, right neck adenopathy 7000 cGy in 35 sessions. High risk nodal bed 5950 cGy in 35 sessions, and low risk nodal bed 5600 cGy in 35 sessions   . OSA on CPAP     uses CPAP nightly  . Esophageal stenosis    Past Surgical History  Procedure Laterality Date  . Appendectomy  ~ 1967    at [facility]  . Interphalangeal joint arthroplasty Right ` 2008    "middle"  . Heel spur surgery Left ~ 1995    "shaved bone down; took nerve out"  . Chest wall tumor excision Left 1977  . Vasectomy  1982  . Direct laryngoscopy N/A 09/12/2013    Procedure: DIRECT LARYNGOSCOPY WITH BIOPSY ;  Surgeon: Izora Gala, MD;  Location: [place];  Service: ENT;  Laterality: N/A;  . Esophagoscopy N/A 09/12/2013    Procedure: ESOPHAGOSCOPY;  Surgeon: Izora Gala,  MD;  Location: [place];  Service: ENT;  Laterality: N/A;  . Portacath placement Left 10/17/2013    power port  . Gastrostomy tube placement Left 10/17/2013  . Foot fracture surgery Left ~ 1995  . Tongue biopsy  09/2013    "@ base of tongue"  . Portacath placement Left 10/17/2013    Procedure: INSERTION PORT-A-CATH;  Surgeon: Stark Klein, MD;  Location: [place];  Service: General;  Laterality: Left;  Johann Capers N/A 10/17/2013    Procedure: LAPAROSCOPIC GASTROJEJUNOSTOMY;  Surgeon: Stark Klein, MD;  Location: [place];  Service: General;  Laterality: N/A;  Dr. Clyda Greener card was used please make a preference card for Dr. Barry Dienes.  . Esophagoscopy with dilitation N/A 01/12/2015    Procedure: ESOPHAGOSCOPY WITH DILITATION;  Surgeon: Izora Gala, MD;  Location: [place];  Service: ENT;  Laterality: N/A;  . Direct laryngoscopy N/A 01/12/2015    Procedure:  DIRECT LARYNGOSCOPY;  Surgeon: Izora Gala, MD;  Location: [place];  Service: ENT;  Laterality: N/A;     ALLERGIES:  Allergies  Allergen Reactions  . Codeine Nausea And Vomiting  . Velosef [Cephradine] Rash      CURRENT MEDICATIONS:  Current Outpatient Prescriptions on File Prior to Visit  Medication Sig Dispense Refill  . ENSURE PLUS (ENSURE PLUS) LIQD Take 237 mLs by mouth. 6 to 7 cans per day.    . ibuprofen (ADVIL,MOTRIN) 600 MG tablet Take 600 mg by mouth every 6 (six) hours as needed for headache.    . levothyroxine (SYNTHROID, LEVOTHROID) 75 MCG tablet Take 1 tablet (75 mcg total) by mouth daily before breakfast. 30 tablet 1  . sodium fluoride (FLUORISHIELD) 1.1 % GEL dental gel Place 1 application onto teeth at bedtime.     No current facility-administered medications on file prior to visit.       ONCOLOGIC FAMILY HISTORY:  Family History  Problem Relation Age of Onset  . Cancer Paternal Grandfather       SOCIAL HISTORY:  Mr. Loo is married and lives with his wife in [place], [place].  He continues to work full-time.  He denies any current use of tobacco, alcohol, or illicit drug use.  He has never smoked or used smokeless tobacco.   PHYSICAL EXAMINATION:  Vital Signs: Filed Vitals:   02/03/15 1010  BP: 108/76  Pulse: 78  Temp: 97.9 F (36.6 C)  Resp: 14   Weight: 200 lb 12.8 oz (91.082 kg) 02/03/15  200 lb 4.8 oz (90.855 kg) 01/21/15  207 lb 4.8 oz (94.031 kg) 10/29/14  199 lb 3.2 oz (90.357 kg) 12/24/13 (end of treatment)  214 lb (97.07 kg) 09/24/13 (pre-treatment)    General: Well-nourished, well-appearing in no acute distress.  He is accompanied in clinic by his wife today.   HEENT: Head is atraumatic and normocephalic.  Pupils equal and reactive to light. Conjunctivae clear without exudate.  Sclerae anicteric. Oral mucosa is pink and slightly dry.  Tongue pink and slightly dry. Oropharynx is pink without lesions or  erythema.  Lymph: No cervical, supraclavicular, or infraclavicular lymphadenopathy.  Neck: Mild submental lymphedema.  Moderate fibrosis to anterior neck.  Skin intact.  Cardiovascular: Regular rate and rhythm. Respiratory: Clear to auscultation bilaterally. Chest expansion symmetric without accessory muscle use. GI: Abdomen soft and round. No tenderness to palpation. Bowel sounds normoactive in 4 quadrants. No hepatosplenomegaly.  GU: Deferred.  Neuro: No focal deficits. Steady gait.  Psych: Mood and affect normal and appropriate for situation.  Extremities: No edema, cyanosis, or clubbing.  Skin: Warm and dry. No open lesions noted.   LABORATORY DATA:    Ref. Range 02/03/2015  10/27/2014 07/24/2014  TSH Latest Ref Range: 0.320-4.118 m(IU)/L 4.196 (H) 6.927 (H) 8.564 (H)    DIAGNOSTIC IMAGING:  None for this visit.     ASSESSMENT AND PLAN:  Mr. 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 Underwent concurrent chemo/radiation with Cisplatin. Completed treatment on 12/23/13.    1. History of right base of tongue cancer:  Mr. Zombro will follow-up with his ENT physician, Dr. Constance Holster, in 3 months with history, physical exam, and laryngoscopy per surveillance protocol.  A comprehensive survivorship care plan and treatment summary was reviewed with the patient today detailing his head & neck cancer diagnosis, treatment course, potential late/long-term effects of treatment, appropriate follow-up care with recommendations for the future, and patient education resources.  A copy of this summary, along with a letter will be sent to the patient's primary care provider via mail/fax/In Basket message after today's visit.  Mr. Leichtman is welcome to return to the Survivorship Clinic in the future, as needed.  When he is approximately 2 years out from his diagnosis and treatment, he may be referred back to Survivorship for  continued surveillance and maintenance until his 5 year "graduation" from follow-up at the cancer center. Upcoming appointments:   Appointment Date & Time Responsible Provider    Radiation  Oncology Pt elected to have surveillance with Dr. Constance Holster only at this time.  ---  ENT In 3-4 months, as directed Dr. Constance Holster  Dentist As directed Dr. Enrique Sack or Primary Care Dentist  Survivorship Nurse Practitioner (NP) As needed, until ~2 years out from treatment. [name], NP  Dietitian As needed Dory Peru, RD  Speech Therapy As needed Garald Balding, SLP  Social Work As needed Polo Riley, LCSW  Physical Therapy As needed Serafina Royals, PT     2. Acquired hypothroidism: The thyroid gland is often affected after treatment for head & neck cancer. His last TSH was elevated at 6.927 in 10/2014.  Dr. Alvy Bimler increased his Synthroid supplementation from 55 mcg to 75 mcg at that time.  Today, his TSH is still mildly elevated at 4.196.  He is completely asymptomatic, with the exception of mild fatigue that does not impact his ability to carry our his ADLs.  I will increase his Synthroid to 88 mcg and have him come back for lab recheck only (no office visit needed) in about 8 weeks. Patient is aware that I have e-prescribed this new prescription to his [facility] on [address]. I called and spoke with patient's wife after the visit regarding the need for lab only visit in about 8 weeks. I will make that appt and mail it to them per their request.   3. Mild peripheral neuropathy:  His report of bilateral feet peripheral neuropathy is likely related to his history of receiving Cisplatin.  We discussed different pharmacologic agents that can assist with the numbness/tingling, but the patient states "it isn't that bad and I don't like to take pills."  My hope is that his symptoms will continue to improve, but we also discussed the possibility of there being permanent nerve damage as a result of the  chemotherapy.   I let him know that should his symptoms get worse, then we have options to help manage the  pain if needed.   4. Nutritional status: Mr. Flannelly reports that he is currently able to consume adequate nutrition by mouth via Boost supplements and 1 milkshake per day. His weight remains stable. Now that he has had esophageal dilation, I encouraged him to continue to try to incorporate soft foods into his diet, a long with the Boost/milkshakes.  Our goal would be for him to try to wean down on the need for nutritional supplements and receive more of his nutrition from soft/solid foods in the coming months, but this will continue to be a work in progress.   5. At risk for dysphagia: Given Mr. Spiller's treatment for base of tongue cancer, which included  radiation therapy and chemotherapy, he is at risk for chronic dysphagia.  His dysphagia is much improved since recent EGD with dilation.   He is able to consume some soft foods and liquids without difficulty, but prefers the Boost shakes for now.  I encouraged him to continue to perform the swallowing exercises, as directed by Garald Balding, SLP.  He has not been consistently performing these exercises.  If Mr. Obenauer requires further swallowing or speech therapy evaluation, I will be happy to place that referral, if needed.    5.  Mild neck lymphedema:  When patients with head & neck cancers are treated with  radiation therapy, there is an associated increased risk of neck lymphedema.  Mr. Basciano reports that currently he is experiencing what would be considered mild symptoms. He has a neck compression garment, but reports that he doesn't wear it very often.  He states that he is inconsistent with manual lymph massage as well.  I encouraged Mr. Lamp to wear the compression garment and practice the massage techniques to reduce the presence of lymphedema, as prevention is best in long-term lymphedema management.  If his symptoms worsen, I would happy to  place a formal referral to physical therapy for further evaluation and treatment.  Right now, his symptoms are mild and not bothersome to the patient.    6. Xerostomia & risk for tooth decay/dental concerns: After treatment with radiation for head & neck cancers patients often experience xerostomia, which increases their risk of dental caries. Mr. Luft was encouraged to see his dentist 3-4 times per year.  He has not been using his fluoride trays, but I encouraged him to restart those and use them daily, as directed by dentistry. I encouraged him to reach out to either Dr. Enrique Sack (oncology dentist) or his primary care dentist with additional questions or concerns.  He reports that he recently saw his primary dentist and a cavity was discovered, but otherwise he was told that his teeth were strong and doing well.  I again stressed the importance of oral health/maintenance to prevent further dental complications.   7. Lung cancer screening:  Mr. Polis currently does not meet criteria for lung cancer screening, as he has never smoked.    8. Tobacco & alcohol use: Mr. Blegen reports that he has never smoked nor used smokeless tobacco and continues to abstain from all tobacco products.  I congratulated his continued efforts to remain tobacco free.  I also reinforced the importance of avoiding alcohol consumption as well.  Both tobacco and alcohol use in patients with head & neck cancer increases the risk of recurrence.  They also increase the risk of other cancers, as well.  Mr. Twohig states that he voiced understanding of the importance of continuing to remain both tobacco and alcohol-free.  9. Health maintenance and wellness promotion: Cancer patients who consume a diet rich in fruits and vegetables have better overall health and decreased risk of cancer recurrence. Mr. Doorley was encouraged to consume 5-7 servings of fruits and vegetables per day, as tolerated. We reviewed the "Nutrition Rainbow" handout.  He was also encouraged to engage in moderate to vigorous exercise for 30 minutes per day most days of the week. We discussed the [employer] fitness program, which is designed for cancer survivors to help them become more physically fit after cancer treatments. He will consider joining [employer] and reach out to his local [employer] if he decides to participate.   10. Support services/counseling: It is not uncommon for this period of the patient's cancer care trajectory to be one of many emotions and stressors.  Mr. Ybarbo and his wife completed the Fall  session of the Head & Neck [facility] ("Finding Your New Normal") support group series, designed for patients after they have completed treatment.   Mr. Furtado was encouraged to take advantage of our many other support services programs, support groups, and/or counseling in coping with his new life as a cancer survivor after completing anti-cancer treatment.  He and his wife were offered support today through active listening and expressive supportive counseling. I encouraged Mr. Deman to attend the [facility] Survivor Celebration, as well as the cancer center's Survivor Day in June 2017.   A total of 60 minutes of face-to-face time was spent with this patient with greater than 50% of that time in counseling and care-coordination.   [name], NP Survivorship Program [facility] [phone]   Note: PRIMARY CARE PROVIDER Imelda Pillow, NP [place] Fax: [phone]

## 2015-04-06 ENCOUNTER — Telehealth: Payer: Self-pay | Admitting: Adult Health

## 2015-04-06 ENCOUNTER — Other Ambulatory Visit: Payer: Self-pay | Admitting: Adult Health

## 2015-04-06 ENCOUNTER — Other Ambulatory Visit (HOSPITAL_BASED_OUTPATIENT_CLINIC_OR_DEPARTMENT_OTHER): Payer: BLUE CROSS/BLUE SHIELD

## 2015-04-06 DIAGNOSIS — C01 Malignant neoplasm of base of tongue: Secondary | ICD-10-CM | POA: Diagnosis not present

## 2015-04-06 DIAGNOSIS — E039 Hypothyroidism, unspecified: Secondary | ICD-10-CM | POA: Diagnosis not present

## 2015-04-06 LAB — TSH: TSH: 4.908 m(IU)/L — ABNORMAL HIGH (ref 0.320–4.118)

## 2015-04-06 MED ORDER — LEVOTHYROXINE SODIUM 112 MCG PO TABS: 112.0000 ug | ORAL_TABLET | Freq: Every day | ORAL | Status: DC

## 2015-04-06 NOTE — Telephone Encounter (Signed)
Mr. Durango presented to the lab today for TSH lab only.  Results revealed:   Results for OMARRION, DEBNAM (MRN [id number]) as of 04/06/2015 13:04  Ref. Range 02/03/2015 09:57 04/06/2015 08:47  TSH Latest Ref Range: 0.320-4.118 m(IU)/L 4.196 (H) 4.908 (H)    I attempted to reach Mr. Riopelle to review his lab results with him and ensure he is taking his current dose of Synthroid 88 mcg daily as directed.  If he is taking his synthroid at this dose, then I will increase his Synthroid to 112 mcg and plan to recheck his TSH only in about 6 weeks.  I am awaiting his return call before prescribing a new dose of medication.    [name], NP [place] [phone]

## 2015-04-06 NOTE — Telephone Encounter (Signed)
I received a return call from the patient's wife.  I let her know that Mr. Kozar's TSH was still elevated today at 4.908.  I had her verify that he is currently taking 88 mcg Synthroid; she confirmed that he is taking this dose, first thing in the morning before breakfast.  She reports that he "always says he is fine, but I have noticed that he seems so much more tired lately."  I explained the role of our thyroid in normal physiology and how hypothyroidism can be the cause for increased fatigue.    Therefore, I have increased his Synthroid to 112 mcg po daily.  I have e-prescribed this new dose to his [facility] on [address]. I verified this pharmacy with his wife.  I have also asked that the patient return to the cancer center for labs only to recheck TSH in about 6 weeks.  My hope is he will see improvement in symptoms in the comings with the increased dose of Synthroid.  I encouraged her to call me with any questions.  She asked that I make the lab appt and mail it to their home; the patient prefers morning appts.    I have made his f/u lab only appt for Tuesday, 05/19/15 at 8:30am to recheck his TSH.  I have mailed them a copy of this appt to their home, as requested. They know to call me with any questions or concerns before that time.  They are planning to see Dr. Constance Holster for routine surveillance/follow-up visit sometime in March, per his wife.  I let her know that I would be happy to see them anytime in the interim, if needed. She voiced understanding and appreciation.    [name], NP [place] [phone]

## 2015-04-27 ENCOUNTER — Other Ambulatory Visit: Payer: Self-pay | Admitting: Adult Health

## 2015-04-27 NOTE — Telephone Encounter (Signed)
Synthroid increased to 112 mcg po daily on 04/06/15.  Pt should no longer be taking 88 mcg.

## 2015-05-19 ENCOUNTER — Encounter: Payer: Self-pay | Admitting: Adult Health

## 2015-05-19 ENCOUNTER — Other Ambulatory Visit (HOSPITAL_BASED_OUTPATIENT_CLINIC_OR_DEPARTMENT_OTHER): Payer: BLUE CROSS/BLUE SHIELD

## 2015-05-19 DIAGNOSIS — E039 Hypothyroidism, unspecified: Secondary | ICD-10-CM | POA: Diagnosis not present

## 2015-05-19 DIAGNOSIS — C01 Malignant neoplasm of base of tongue: Secondary | ICD-10-CM | POA: Diagnosis not present

## 2015-05-19 LAB — TSH: TSH: 1.673 m(IU)/L (ref 0.320–4.118)

## 2015-05-29 DIAGNOSIS — Z85818 Personal history of malignant neoplasm of other sites of lip, oral cavity, and pharynx: Secondary | ICD-10-CM | POA: Insufficient documentation

## 2015-06-02 ENCOUNTER — Telehealth: Payer: Self-pay | Admitting: Adult Health

## 2015-06-02 ENCOUNTER — Other Ambulatory Visit: Payer: Self-pay | Admitting: Adult Health

## 2015-06-02 DIAGNOSIS — E039 Hypothyroidism, unspecified: Secondary | ICD-10-CM

## 2015-06-02 DIAGNOSIS — C01 Malignant neoplasm of base of tongue: Secondary | ICD-10-CM

## 2015-06-02 MED ORDER — LEVOTHYROXINE SODIUM 112 MCG PO TABS: 112.0000 ug | ORAL_TABLET | Freq: Every day | ORAL | Status: DC

## 2015-06-02 NOTE — Telephone Encounter (Signed)
I spoke with the patient's wife and wanted to follow up with them since I received notification that the patient had not read the MyChart message I sent to him on 05/19/15.  I let his wife know that his TSH was completely normal at that time and he should remain on the current dose of the Synthroid (112 mcg) once daily.  She voiced understanding and appreciation.    She also asked that I send in more refills for this medication, which I did as well.  She states they are doing well with no new complaints.  I encouraged her to call us with any more questions or concerns.   [name], NP [place] [phone]

## 2015-06-16 ENCOUNTER — Telehealth: Payer: Self-pay | Admitting: Adult Health

## 2015-06-16 NOTE — Telephone Encounter (Signed)
I received a concerned call from [name] Wu, with questions re: new symptoms expressed by her husband, [name] Wu.   She tells me that he has been having right breast/axilla swelling and thinks that she may have felt a lump.  The patient denies any pain to the area, but the swelling is "annoying" and uncomfortable for him.  She states that the swelling and lump are not near the collarbone and is more lateral.  There are no associated skin changes (no redness). She thinks this has been going on for about 1-2 weeks, but reports that her husband often waits to tell her about any symptom until it has gone on for awhile and he is more concerned about it.  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   I offered reassurance that it would be extremely rare for a BOT cancer to have a metastatic node in the axilla.  I reviewed his previous CT and PET scans from 2016 which showed resolution of hypermetabolic activity in the BOT and neck and other findings were likely benign as they were not "hot" on PET scan.  His wife stated that "they were watching some lymph node on his right side and we're worried that this particular lymph node has now spread to the lymph node in his chest/axilla."  I reinforced that the area of concern was a right paratracheal lymph node, which would be more in his neck/upper chest area and likely he would not be able to feel them externally.   I offered to have them go to his PCP's office and encouraged her to ask for an ultrasound and/or mammogram of the breast tissue and right axilla to further evaluate.  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    However, his wife states that they would feel better to be seen here at the cancer center due to their anxiety that this is cancer related.  Therefore, I am  happy to see them tomorrow morning for evaluation.  If warranted, I discussed the options of different imaging we could consider tomorrow after I performed a physical exam. She voiced appreciation and they agree with this plan.  I will see them tomorrow morning, 06/17/15 at 8:30 am.   [name], NP [place] [phone]

## 2015-06-17 ENCOUNTER — Ambulatory Visit: Payer: BLUE CROSS/BLUE SHIELD | Admitting: Adult Health

## 2015-06-17 ENCOUNTER — Ambulatory Visit
Admission: RE | Admit: 2015-06-17 | Discharge: 2015-06-17 | Disposition: A | Discharge status: Home / Self Care | Payer: BLUE CROSS/BLUE SHIELD | Source: Ambulatory Visit | Attending: Radiation Oncology | Admitting: Radiation Oncology

## 2015-06-17 ENCOUNTER — Encounter: Payer: Self-pay | Admitting: Radiation Oncology

## 2015-06-17 VITALS — BP 106/65 | HR 79 | Temp 98.1°F | Ht 65.5 in | Wt 199.5 lb

## 2015-06-17 DIAGNOSIS — C01 Malignant neoplasm of base of tongue: Secondary | ICD-10-CM | POA: Diagnosis not present

## 2015-06-17 LAB — BUN AND CREATININE (CC13)
BUN: 21.1 mg/dL (ref 7.0–26.0)
Creatinine: 1.2 mg/dL (ref 0.7–1.3)
EGFR: 63 mL/min/{1.73_m2} — ABNORMAL LOW (ref >90)

## 2015-06-17 NOTE — Progress Notes (Signed)
[name] seen today with new presentation of swelling in right Pectoral region.  See note by [name], NP on 06/16/15.

## 2015-06-17 NOTE — Progress Notes (Signed)
   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    High risk nodal bed 5950 cGy in 35 sessions, and low risk nodal bed 5600 cGy in 35 sessions   Interval History: [name] presents today for routine followup. The patient reports right chest/axilla swelling and he believes that it might be a swollen lymph node. The patient denies pain in this area and describes it as being uncomfortable. He reports this has been present for around 2 weeks. He denies insect bites in that area or any recent injuries or infection. He is taking Aleve for the pain.   Physical Exam:  Filed Vitals:   06/17/15 0838  BP: 106/65  Pulse: 79  Temp: 98.1 F (36.7 C)  Height: 5' 5.5" (1.664 m)  Weight: 199 lb 8 oz (90.493 kg)   Bilateral palpable axillary adenopathy. He does seem to have some right chest swelling as compared to the left with no discrete mass.   IMPRESSION: [name] Wu is a 60 y.o. 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 He is recovering from the effects of chemoradiation.  PLAN: I will order a CT scan of the chest to observe the axillary adenopathy. I will also order labs.  ------------------------------------------------  Thea Silversmith, MD  This document serves as a record of services personally performed by Thea Silversmith, MD. It was created on her behalf by Darcus Austin, a trained medical scribe. The creation of this record is based on the scribe's  personal observations and the provider's statements to them. This document has been checked and approved by the attending provider.

## 2015-06-18 ENCOUNTER — Ambulatory Visit (HOSPITAL_COMMUNITY)
Admission: RE | Admit: 2015-06-18 | Discharge: 2015-06-18 | Disposition: A | Discharge status: Home / Self Care | Payer: BLUE CROSS/BLUE SHIELD | Source: Ambulatory Visit | Attending: Radiation Oncology | Admitting: Radiation Oncology

## 2015-06-18 DIAGNOSIS — C01 Malignant neoplasm of base of tongue: Secondary | ICD-10-CM | POA: Insufficient documentation

## 2015-06-18 MED ORDER — IOPAMIDOL (ISOVUE-300) INJECTION 61%
75.0000 mL | Freq: Once | INTRAVENOUS | Status: AC | PRN
  Administered 2015-06-18: 75 mL via INTRAVENOUS

## 2015-06-19 ENCOUNTER — Telehealth: Payer: Self-pay | Admitting: Adult Health

## 2015-06-19 NOTE — Telephone Encounter (Signed)
I spoke with Mr. Rudnik wife, Joseph Art re: his recent CT chest completed on 06/18/15.  I shared with Joseph Art that his CT scan was negative for any metastatic disease in the chest.  He has slight asymmetric gynecomastia on imaging, which radiologist commented was stable.   I reassured Renee that his right breast swelling was likely completely unrelated to his cancer or treatments.  She expressed concern that it could be related to his hypothyroidism and "his hormones being all out of whack."  I shared with her that while the thyroid gland does help maintain homeostasis in our bodies, but does not control our sex hormones. Also, now that he is euthyroid on Synthroid, it is less likely he is experiencing any type of thyroid dysfunction.  I let her know that men can experience gynecomastia with low testosterone/androgen deprivation therapy when being treated for something like prostate cancer, but this is generally symmetric breast swelling in both breasts.  Some medications can cause breast swelling in men, but he is not on any of the usual medications that have gynecomastia as a possible side effect.   I offered a few different options for Renee and her husband to consider: -Just watch his symptoms and follow-up with them in about 2 months to see if things are improved. -Send him for a mammogram to evaluate the swelling.  -Send him to an endocrinologist to possibly evaluate any hormone imbalance that could be contributing to the swelling.    I again provided reassurance that this swelling is unrelated to his BOT cancer and subsequent treatments and encouraged Renee to talk about these options with her husband.  She stated that she thinks he will likely choose "just watch it" option, but she will call me if they change their minds.  I will make a note to follow-up with them in about 2 months by phone to see how he is feeling and if he needs to be re-evaluated/sent for additional imaging or referral at that time,  I'm happy to do that.  I also provided support today through active listening and acknowledgment/normalization of their fear of cancer recurrence.  She offered gratitude for having him seen/evaluated; I encouraged her to call me directly anytime in the future with further questions.  I will be in touch with them in about 2 months for follow-up.  She agrees with this plan.    [name], NP [place] [phone]

## 2015-08-26 ENCOUNTER — Telehealth: Payer: Self-pay | Admitting: Adult Health

## 2015-08-26 NOTE — Telephone Encounter (Signed)
I called to follow-up with the patient regarding his gynecomastia symptoms that were evaluated/worked up a couple of months ago.  As discussed previously that I would follow-up with them in 2 months, I called to get an update.   I was able to speak with the patient's wife, [name] Wu, who tells me that [name] Wu has not complained about the breast swelling anymore and "it doesn't seem like it's bothering him anymore."  She tells me that they are both doing very well without any current complaints/issues.  I offered support and encouraged them to reach out to me if they have any concerns in the future.  She voiced appreciation and understanding.   [name], NP [place] [phone]

## 2016-04-30 IMAGING — CR DG CHEST 2V
2 series · 2 of 2 positions shown · non-contrast
Comparison: Abdominal CT 02/03/2010.

CLINICAL DATA: Sleep apnea.  History of tuberculosis.

EXAM:
CHEST  2 VIEW

[w chest pa]
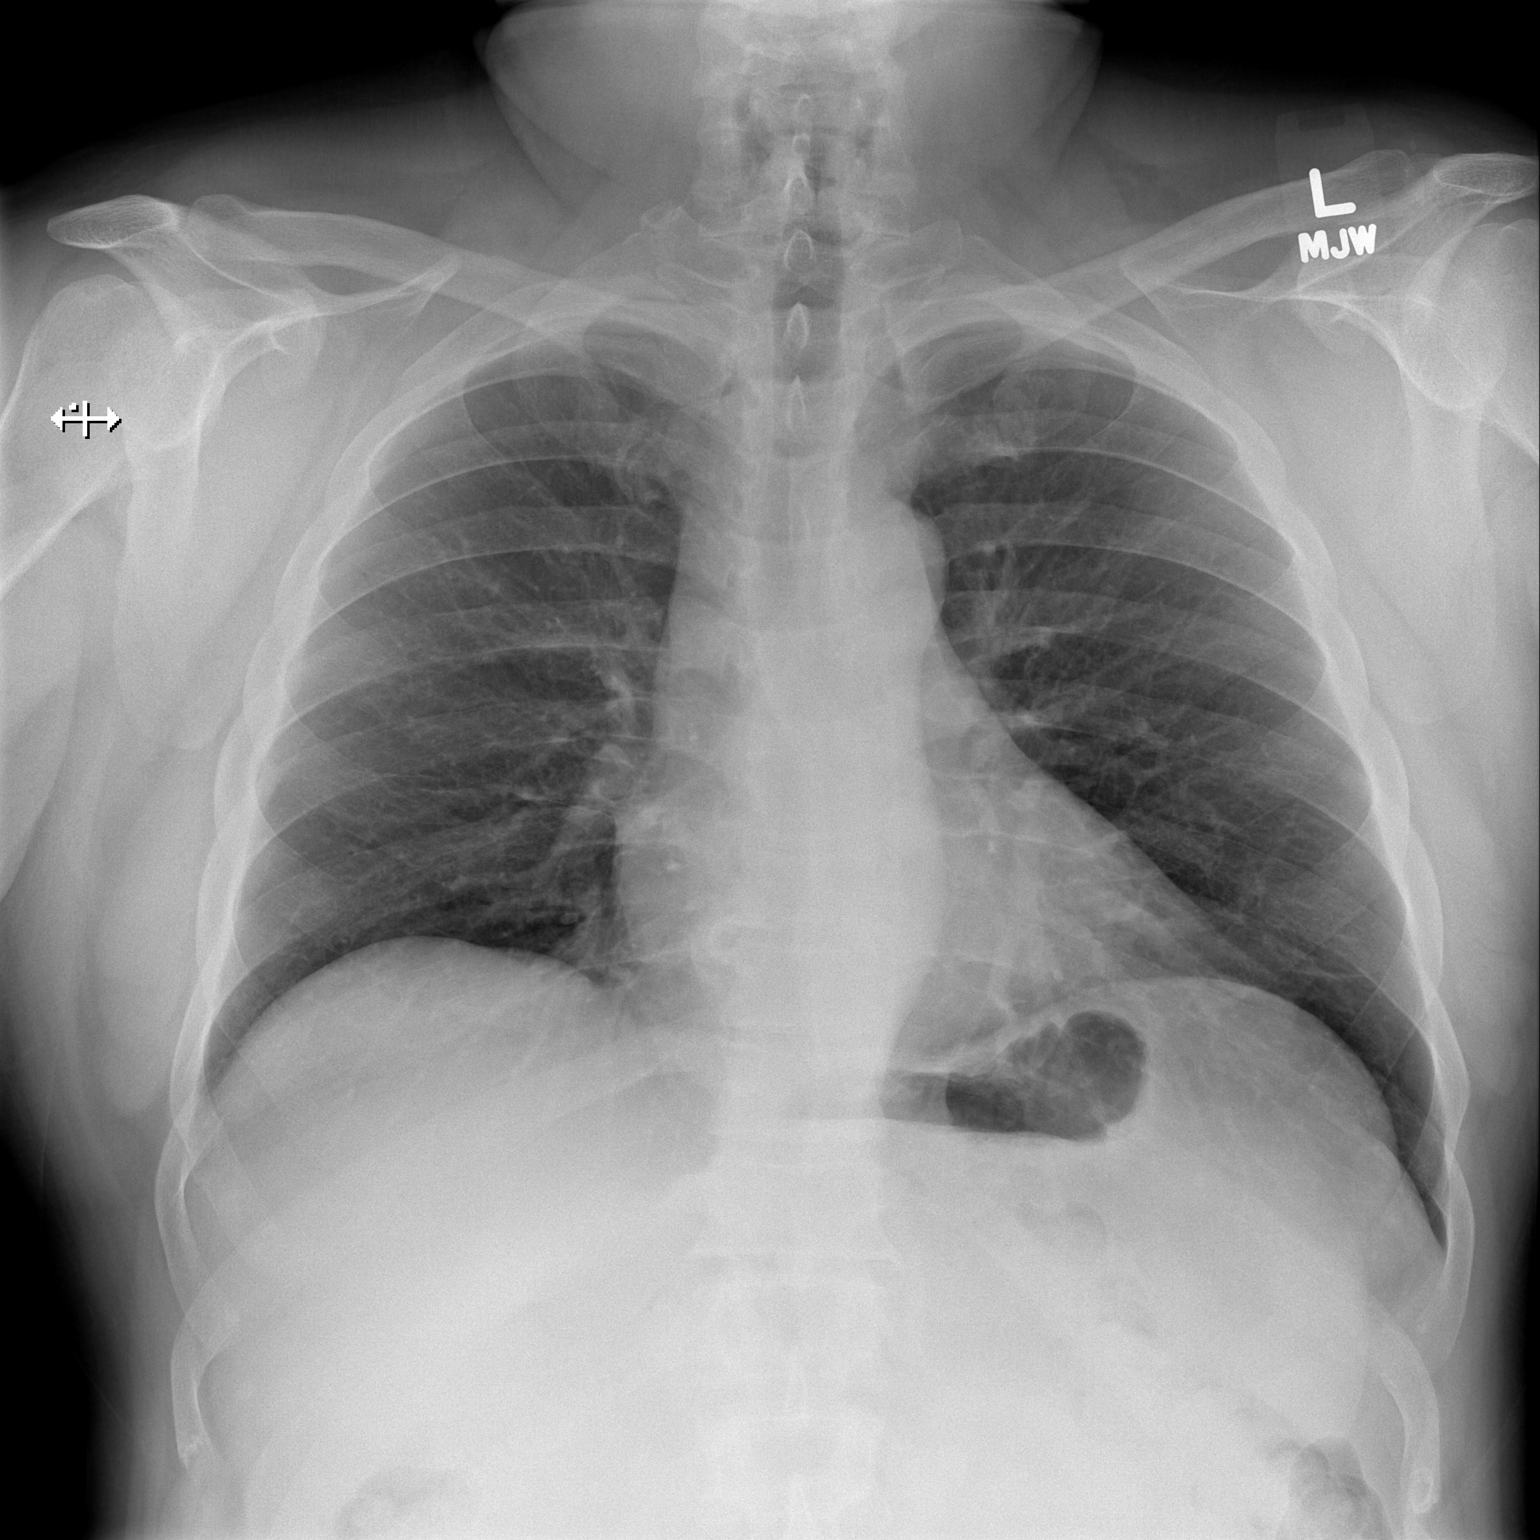

[w chest lat]
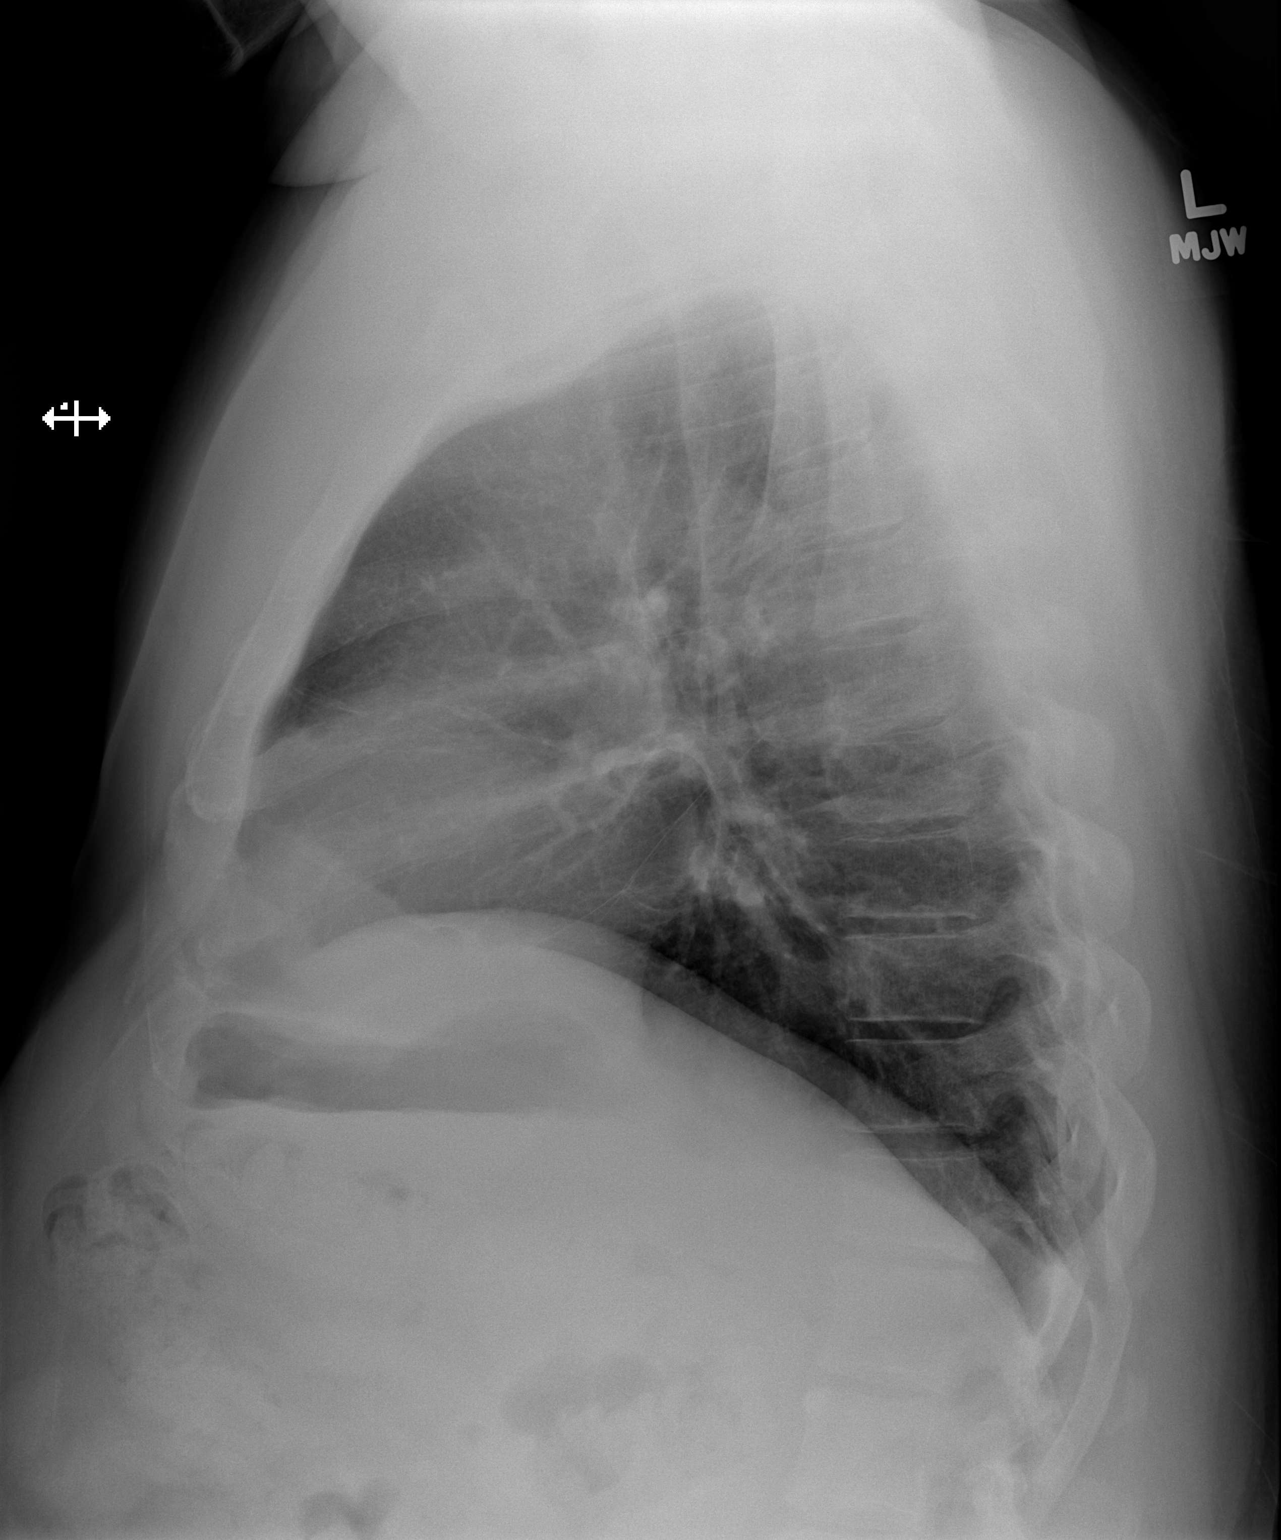

[2 of 2 positions shown; findings below may reference images not displayed]

FINDINGS: The heart size and mediastinal contours are normal. The lungs are
clear. There is no pleural effusion or pneumothorax. No acute
osseous findings are identified.
IMPRESSION: No active cardiopulmonary process.

## 2016-05-06 ENCOUNTER — Other Ambulatory Visit: Payer: Self-pay | Admitting: Adult Health

## 2016-05-06 DIAGNOSIS — C01 Malignant neoplasm of base of tongue: Secondary | ICD-10-CM

## 2016-05-06 DIAGNOSIS — E039 Hypothyroidism, unspecified: Secondary | ICD-10-CM

## 2016-05-10 NOTE — Telephone Encounter (Signed)
Hi [name] Wu,  Please call patient He needs follow-up blood work for thyroid medication refill or he can have refill via PCP If he wants to come back, please place scheduling msg

## 2016-05-10 NOTE — Telephone Encounter (Signed)
Dr. Alvy Bimler,   This is one of our H&N patients I was previously managing. Would you mind helping manage his Synthroid for his acquired hypothyroidism?    Thanks! [name], NP [place] [phone]

## 2016-06-15 IMAGING — XA IR CENTRAL VENOUS CATHETER
6 series · 14 of 14 positions shown · non-contrast
Comparison: none

CLINICAL DATA: History of head and neck carcinoma. Indwelling
left-sided Port-A-Cath demonstrates difficulty flushing during
treatment.

[Series 2: care single · 1 of 1 slices shown (1 of 3)]
[im 1/1]
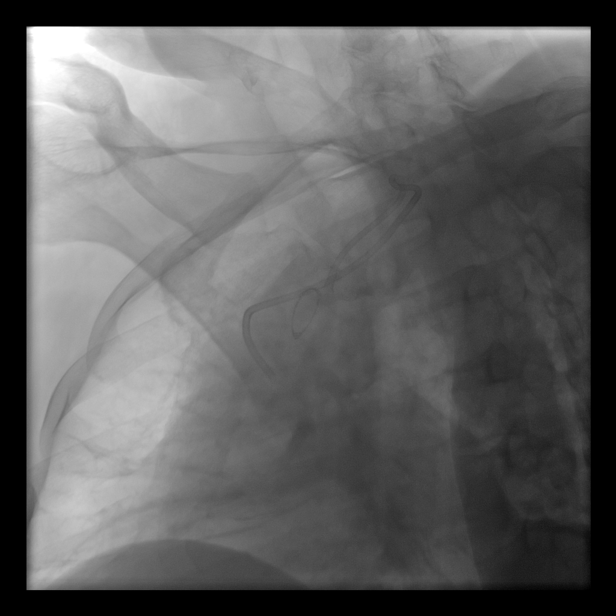

[Series 3: care single · 1 of 1 slices shown (2 of 3)]
[im 1/1]
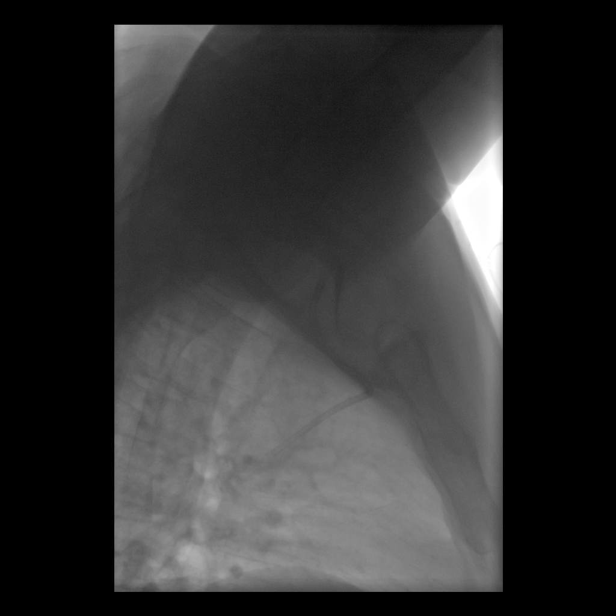

[Series 5: care single · 1 of 1 slices shown (3 of 3)]
[im 1/1]
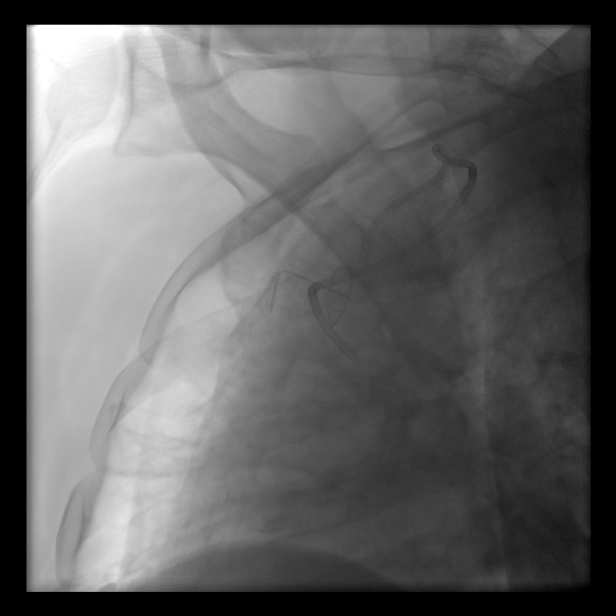

[Series 6: fl - angio · 4 of 27 frames shown]
[frame 4/27]
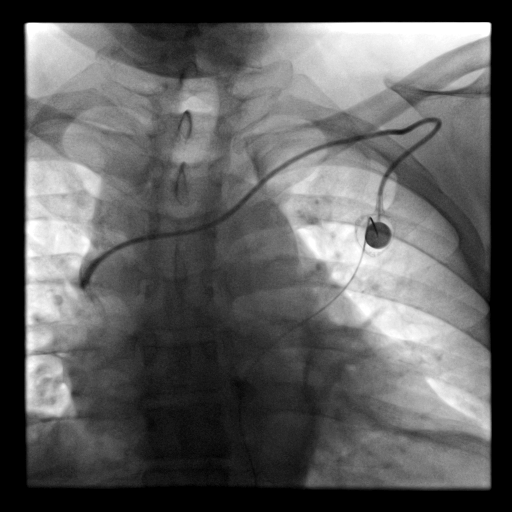
[frame 5/27]
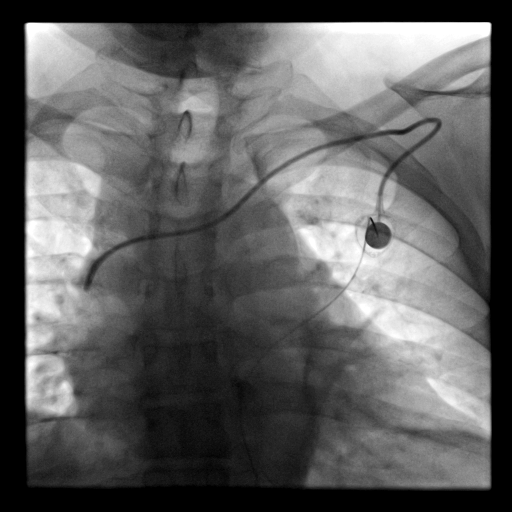
[frame 14/27]
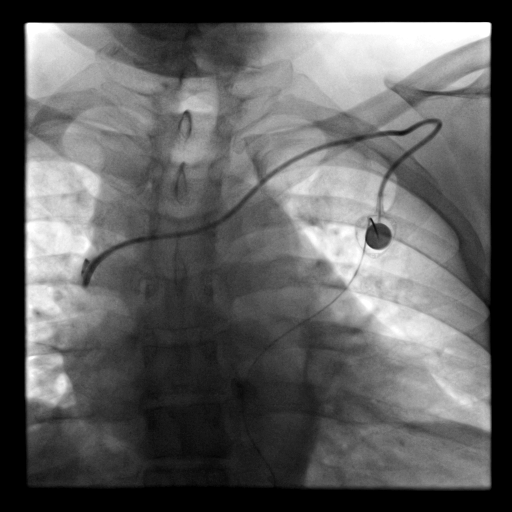
[frame 23/27]
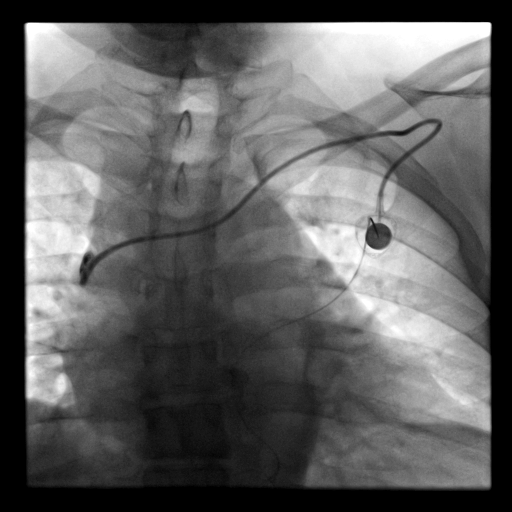

[Series 7: dsa check · 4 of 14 frames shown]
[frame 3/14]
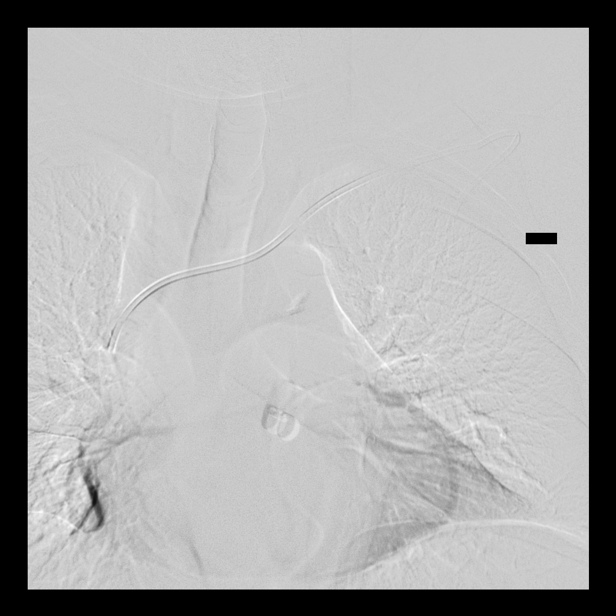
[frame 8/14]
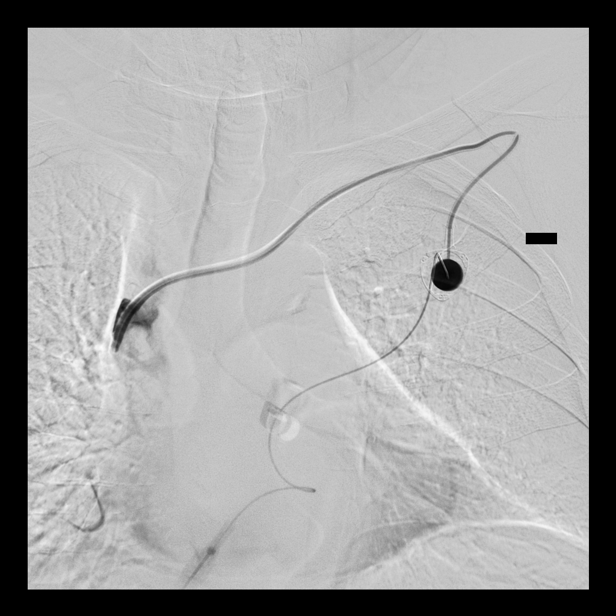
[frame 12/14]
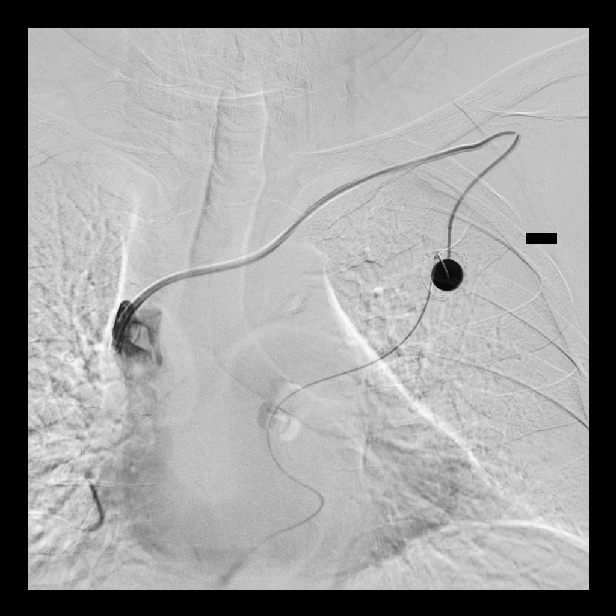
[frame 14/14]
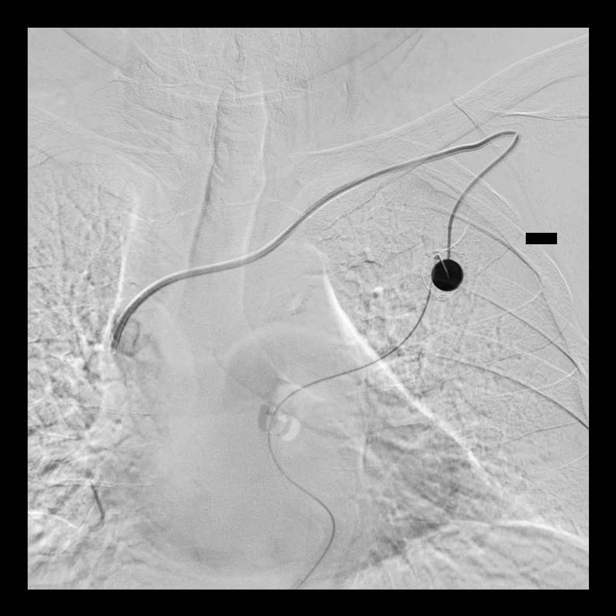

[Series 300: sp cv line injection (existing line) · 3 of 3 slices shown]
[im 1/3]
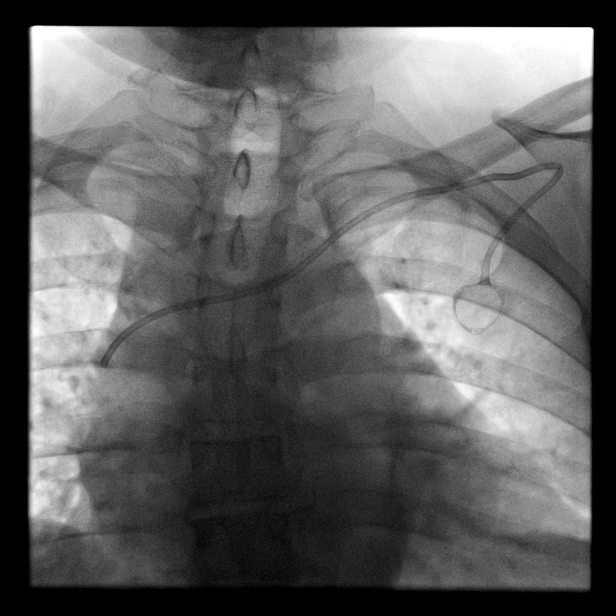
[im 2/3]
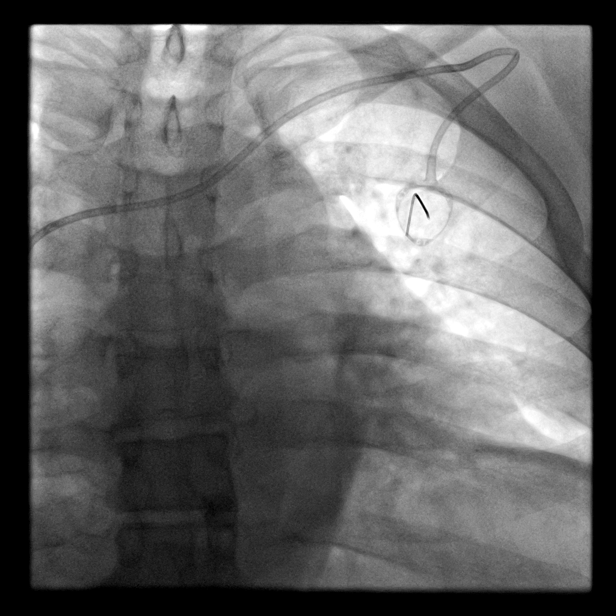
[im 3/3]
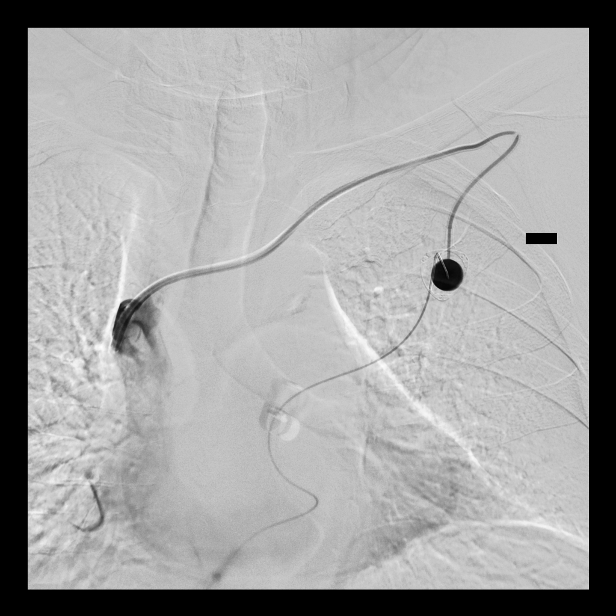

[14 of 14 positions shown; findings below may reference images not displayed]

EXAM:
CONTRAST INJECTION OF PORT A CATH UNDER FLUOROSCOPY

CONTRAST:  10 mL Omnipaque 300

FLUOROSCOPY TIME:  12 seconds.

PROCEDURE:
Initial fluoroscopy was performed of the catheter including oblique
projections. Fluoroscopic spot images were saved. Contrast was
administered via the indwelling port after it was accessed.
Fluoroscopic spot images were obtained of the catheter during
injection.
FINDINGS: Fluoroscopy shows a single lumen left subclavian Port-A-Cath with
the catheter tip projecting over the upper SVC. Lateral aspect of
the catheter in the infraclavicular region does show an area of mild
kinking as demonstrated on the postprocedural x-ray immediately
after placement on 10/17/2013.

Initial attempt at saline injection was extremely difficult with
resistance met. Utilizing a smaller syringe, contrast was able to be
injected into the catheter. The port reservoir and catheter tubing
are intact without evidence of extravasation. There is persistent
focal kinking of the catheter immediately inferior to the clavicle.
The tip of the catheter is located in the upper SVC. Contrast
injection does demonstrate fibrin sheath material around the tip of
the catheter with contrast exiting primarily around the sheath
rather than from the true tip of the catheter and eventually flowing
into the SVC.
IMPRESSION: 1. Port-A-Cath positioning with catheter tip in the upper SVC. There
is significant fibrin sheath around the tip of the catheter without
complete obstruction of the catheter.
2. Kinking of the catheter in the subcutaneous tissue in the
infraclavicular region. This is not obstructive but may account for
some of the difficulty flushing the catheter.
3. No evidence of catheter disruption or contrast extravasation.

## 2017-05-04 DIAGNOSIS — Z923 Personal history of irradiation: Secondary | ICD-10-CM | POA: Insufficient documentation

## 2018-01-15 ENCOUNTER — Other Ambulatory Visit (HOSPITAL_BASED_OUTPATIENT_CLINIC_OR_DEPARTMENT_OTHER): Payer: Self-pay

## 2018-01-15 DIAGNOSIS — R0683 Snoring: Secondary | ICD-10-CM

## 2018-01-15 DIAGNOSIS — G4730 Sleep apnea, unspecified: Secondary | ICD-10-CM

## 2018-01-24 ENCOUNTER — Ambulatory Visit (HOSPITAL_BASED_OUTPATIENT_CLINIC_OR_DEPARTMENT_OTHER): Payer: BLUE CROSS/BLUE SHIELD | Attending: *Deleted | Admitting: Internal Medicine

## 2018-01-24 DIAGNOSIS — G4730 Sleep apnea, unspecified: Secondary | ICD-10-CM | POA: Diagnosis not present

## 2018-01-24 DIAGNOSIS — R0683 Snoring: Secondary | ICD-10-CM | POA: Insufficient documentation

## 2018-01-24 DIAGNOSIS — G4733 Obstructive sleep apnea (adult) (pediatric): Secondary | ICD-10-CM

## 2018-01-29 ENCOUNTER — Other Ambulatory Visit (HOSPITAL_BASED_OUTPATIENT_CLINIC_OR_DEPARTMENT_OTHER): Payer: Self-pay

## 2018-01-29 DIAGNOSIS — G4730 Sleep apnea, unspecified: Secondary | ICD-10-CM

## 2018-01-29 DIAGNOSIS — R0683 Snoring: Secondary | ICD-10-CM

## 2018-02-03 NOTE — Procedures (Signed)
   Patient Name: [name] Wu, [name] Wu Date: 01/24/2018 Gender: Male D.O.B: [birth year] Age (years): 20 Referring Provider: Everardo Beals NP Height (inches): 69 Interpreting Physician: Baird Lyons MD, ABSM Weight (lbs): 205 RPSGT: Jonna Coup BMI: 35 MRN: [id number] Neck Size:  CLINICAL INFORMATION Sleep Study Type: HST Indication for sleep study: Snoring Epworth Sleepiness Score: 7  SLEEP STUDY TECHNIQUE A multi-channel overnight portable sleep study was performed. The channels recorded were: nasal airflow, thoracic respiratory movement, and oxygen saturation with a pulse oximetry. Snoring was also monitored.  MEDICATIONS Patient self administered medications include: none reported.  SLEEP ARCHITECTURE Patient was studied for 363.2 minutes. The sleep efficiency was 99.0 % and the patient was supine for 56.9%. The arousal index was 0.0 per hour.  RESPIRATORY PARAMETERS The overall AHI was 13.7 per hour, with a central apnea index of 0.0 per hour. The oxygen nadir was 76% during sleep.  CARDIAC DATA Mean heart rate during sleep was 66.4 bpm.  IMPRESSIONS - Mild obstructive sleep apnea occurred during this study (AHI = 13.7/h). - No significant central sleep apnea occurred during this study (CAI = 0.0/h). - Oxygen desaturation was noted during this study (Min O2 = 76%). Mean 93%. - Patient snored 11.0% during the sleep.  DIAGNOSIS - Obstructive Sleep Apnea (327.23 [G47.33 ICD-10])  RECOMMENDATIONS - Treatment for mild OSA is directed at symptoms. Conservative measuires may include observation, weight loss, sleep position off back.  - If appropriate, CPAP titration sleep study, autopap, a fitted oral appliance, sleep medical consultation or ENT evaluation might be considered. - Be carefful with alcohol, sedatives and other CNS depressants that may worsen sleep apnea and disrupt normal sleep architecture. - Sleep hygiene should be reviewed to assess factors that may  improve sleep quality. - Weight management and regular exercise should be initiated or continued.  [Electronically signed] 02/03/2018 10:28 AM  Baird Lyons MD, ABSM Diplomate, American Board of Sleep Medicine   NPI: [id number]                          [place] [place] [facility]  ELECTRONICALLY SIGNED ON:  02/03/2018, 10:23 AM [place] PH: [phone]   FX: [phone] [place]

## 2018-02-04 DIAGNOSIS — G4733 Obstructive sleep apnea (adult) (pediatric): Secondary | ICD-10-CM | POA: Diagnosis not present

## 2019-07-09 DIAGNOSIS — M79645 Pain in left finger(s): Secondary | ICD-10-CM | POA: Insufficient documentation

## 2020-03-18 LAB — COLOGUARD: COLOGUARD: POSITIVE — AB

## 2020-05-05 ENCOUNTER — Other Ambulatory Visit: Payer: Self-pay

## 2020-05-05 ENCOUNTER — Ambulatory Visit (AMBULATORY_SURGERY_CENTER): Payer: Self-pay | Admitting: *Deleted

## 2020-05-05 ENCOUNTER — Telehealth: Payer: Self-pay | Admitting: *Deleted

## 2020-05-05 VITALS — Ht 65.5 in | Wt 204.2 lb

## 2020-05-05 DIAGNOSIS — R195 Other fecal abnormalities: Secondary | ICD-10-CM

## 2020-05-05 MED ORDER — SUPREP BOWEL PREP KIT 17.5-3.13-1.6 GM/177ML PO SOLN: 1.0000 | Freq: Once | ORAL | 0 refills | Status: AC

## 2020-05-05 NOTE — Telephone Encounter (Signed)
[name] Wu,  I saw this pt in [place] this am.  His colonoscopy is 05-19-20.  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  I just wanted to make you aware and make sure no issues with having his procedure done at [facility].  Thanks, [employer]

## 2020-05-05 NOTE — Progress Notes (Signed)
  No trouble with anesthesia, denies being told they were difficult to intubate, or hx/fam hx of malignant hyperthermia per pt    No egg or soy allergy  No home oxygen use   No medications for weight loss taken  emmi information given  Pt denies constipation issues  Pt informed that we do not do prior authorizations for prep  

## 2020-05-05 NOTE — Telephone Encounter (Signed)
noted 

## 2020-05-05 NOTE — Telephone Encounter (Signed)
[name] Wu,  His latest ENT note describes a clear airway.  This pt is cleared for anesthetic care at [facility].  Thanks,  Osvaldo Angst

## 2020-05-19 ENCOUNTER — Ambulatory Visit (AMBULATORY_SURGERY_CENTER): Payer: Medicare Other | Admitting: Internal Medicine

## 2020-05-19 ENCOUNTER — Encounter: Payer: Self-pay | Admitting: Internal Medicine

## 2020-05-19 ENCOUNTER — Other Ambulatory Visit: Payer: Self-pay

## 2020-05-19 VITALS — BP 122/88 | HR 78 | Temp 98.6°F | Resp 16 | Ht 65.5 in | Wt 204.0 lb

## 2020-05-19 DIAGNOSIS — R195 Other fecal abnormalities: Secondary | ICD-10-CM

## 2020-05-19 DIAGNOSIS — D128 Benign neoplasm of rectum: Secondary | ICD-10-CM | POA: Diagnosis not present

## 2020-05-19 DIAGNOSIS — D122 Benign neoplasm of ascending colon: Secondary | ICD-10-CM

## 2020-05-19 MED ORDER — SODIUM CHLORIDE 0.9 % IV SOLN: 500.0000 mL | Freq: Once | INTRAVENOUS | Status: DC

## 2020-05-19 NOTE — Patient Instructions (Signed)
YOU HAD AN ENDOSCOPIC PROCEDURE TODAY AT THE [facility]:   Refer to the procedure report that was given to you for any specific questions about what was found during the examination.  If the procedure report does not answer your questions, please call your gastroenterologist to clarify.  If you requested that your care partner not be given the details of your procedure findings, then the procedure report has been included in a sealed envelope for you to review at your convenience later.  YOU SHOULD EXPECT: Some feelings of bloating in the abdomen. Passage of more gas than usual.  Walking can help get rid of the air that was put into your GI tract during the procedure and reduce the bloating. If you had a lower endoscopy (such as a colonoscopy or flexible sigmoidoscopy) you may notice spotting of blood in your stool or on the toilet paper. If you underwent a bowel prep for your procedure, you may not have a normal bowel movement for a few days.  Please Note:  You might notice some irritation and congestion in your nose or some drainage.  This is from the oxygen used during your procedure.  There is no need for concern and it should clear up in a day or so.  SYMPTOMS TO REPORT IMMEDIATELY:  Following lower endoscopy (colonoscopy or flexible sigmoidoscopy):  Excessive amounts of blood in the stool  Significant tenderness or worsening of abdominal pains  Swelling of the abdomen that is new, acute  Fever of 100F or higher   For urgent or emergent issues, a gastroenterologist can be reached at any hour by calling [phone]. Do not use MyChart messaging for urgent concerns.    DIET:  We do recommend a small meal at first, but then you may proceed to your regular diet.  Drink plenty of fluids but you should avoid alcoholic beverages for 24 hours.  MEDICATIONS:  Continue present medications.  Please see handouts given to you by your recovery nurse.  Thank you for allowing us to  provide for your healthcare needs today.  ACTIVITY:  You should plan to take it easy for the rest of today and you should NOT DRIVE or use heavy machinery until tomorrow (because of the sedation medicines used during the test).    FOLLOW UP: Our staff will call the number listed on your records 48-72 hours following your procedure to check on you and address any questions or concerns that you may have regarding the information given to you following your procedure. If we do not reach you, we will leave a message.  We will attempt to reach you two times.  During this call, we will ask if you have developed any symptoms of COVID 19. If you develop any symptoms (ie: fever, flu-like symptoms, shortness of breath, cough etc.) before then, please call [phone].  If you test positive for Covid 19 in the 2 weeks post procedure, please call and report this information to us.    If any biopsies were taken you will be contacted by phone or by letter within the next 1-3 weeks.  Please call us at [phone] if you have not heard about the biopsies in 3 weeks.    SIGNATURES/CONFIDENTIALITY: You and/or your care partner have signed paperwork which will be entered into your electronic medical record.  These signatures attest to the fact that that the information above on your After Visit Summary has been reviewed and is understood.  Full responsibility of the   confidentiality of this discharge information lies with you and/or your care-partner.  

## 2020-05-19 NOTE — Op Note (Signed)
[place] [facility] Patient Name: [name] Wu Procedure Date: 05/19/2020 3:11 PM MRN: [id number] Endoscopist: Docia Chuck. Henrene Pastor , MD Age: 65 Referring MD:  Date of Birth: [birth year] Gender: Male Account #: [id number] Procedure:                Colonoscopy with cold snare polypectomy x 2 Indications:              Positive Cologuard test Medicines:                Monitored Anesthesia Care Procedure:                Pre-Anesthesia Assessment:                           - Prior to the procedure, a History and Physical                            was performed, and patient medications and                            allergies were reviewed. The patient's tolerance of                            previous anesthesia was also reviewed. The risks                            and benefits of the procedure and the sedation                            options and risks were discussed with the patient.                            All questions were answered, and informed consent                            was obtained. Prior Anticoagulants: The patient has                            taken no previous anticoagulant or antiplatelet                            agents. ASA Grade Assessment: II - A patient with                            mild systemic disease. After reviewing the risks                            and benefits, the patient was deemed in                            satisfactory condition to undergo the procedure.                           After obtaining informed consent, the colonoscope  was passed under direct vision. Throughout the                            procedure, the patient's blood pressure, pulse, and                            oxygen saturations were monitored continuously. The                            Olympus [id number]) [id number] was introduced                            through the anus and advanced to the the cecum,                             identified by appendiceal orifice and ileocecal                            valve. The ileocecal valve, appendiceal orifice,                            and rectum were photographed. The quality of the                            bowel preparation was excellent. The colonoscopy                            was performed without difficulty. The patient                            tolerated the procedure well. The bowel preparation                            used was SUPREP via split dose instruction. Scope In: 3:20:47 PM Scope Out: 3:32:53 PM Scope Withdrawal Time: 0 hours 10 minutes 22 seconds  Total Procedure Duration: 0 hours 12 minutes 6 seconds  Findings:                 Two polyps were found in the rectum and ascending                            colon. The polyps were 2 to 3 mm in size. These                            polyps were removed with a cold snare. Resection                            and retrieval were complete.                           The exam was otherwise without abnormality on                            direct and retroflexion  views. Complications:            No immediate complications. Estimated blood loss:                            None. Estimated Blood Loss:     Estimated blood loss: none. Impression:               - Two 2 to 3 mm polyps in the rectum and in the                            ascending colon, removed with a cold snare.                            Resected and retrieved.                           - The examination was otherwise normal on direct                            and retroflexion views. Recommendation:           - Repeat colonoscopy in 7 years for surveillance.                           - Patient has a contact number available for                            emergencies. The signs and symptoms of potential                            delayed complications were discussed with the                            patient. Return to normal activities tomorrow.                             Written discharge instructions were provided to the                            patient.                           - Resume previous diet.                           - Continue present medications.                           - Await pathology results. Docia Chuck. Henrene Pastor, MD 05/19/2020 3:43:13 PM This report has been signed electronically.

## 2020-05-19 NOTE — Progress Notes (Signed)
PT taken to PACU. Monitors in place. VSS. Report given to RN. 

## 2020-05-19 NOTE — Progress Notes (Signed)
Called to room to assist during endoscopic procedure.  Patient ID and intended procedure confirmed with present staff. Received instructions for my participation in the procedure from the performing physician.  

## 2020-05-19 NOTE — Progress Notes (Signed)
VS by CW  Pt's states no medical or surgical changes since previsit or office visit.  

## 2020-05-21 ENCOUNTER — Telehealth: Payer: Self-pay | Admitting: *Deleted

## 2020-05-21 NOTE — Telephone Encounter (Signed)
  Follow up Call-  Call back number 05/19/2020  Post procedure Call Back phone  # [phone]  Permission to leave phone message Yes  Some recent data might be hidden     Patient questions:  Do you have a fever, pain , or abdominal swelling? No. Pain Score  0 *  Have you tolerated food without any problems? Yes.    Have you been able to return to your normal activities? Yes.    Do you have any questions about your discharge instructions: Diet   No. Medications  No. Follow up visit  No.  Do you have questions or concerns about your Care? No.  Actions: * If pain score is 4 or above: 1. No action needed, pain <4.Have you developed a fever since your procedure? no  2.   Have you had an respiratory symptoms (SOB or cough) since your procedure? no  3.   Have you tested positive for COVID 19 since your procedure no  4.   Have you had any family members/close contacts diagnosed with the COVID 19 since your procedure?  no   If yes to any of these questions please route to Joylene John, RN and Joella Prince, RN

## 2020-05-26 ENCOUNTER — Encounter: Payer: Self-pay | Admitting: Internal Medicine

## 2020-09-09 ENCOUNTER — Emergency Department (HOSPITAL_COMMUNITY)
Admission: EM | Admit: 2020-09-09 | Discharge: 2020-09-10 | Disposition: A | Discharge status: Home / Self Care | Payer: Medicare Other | Attending: Emergency Medicine | Admitting: Emergency Medicine

## 2020-09-09 ENCOUNTER — Other Ambulatory Visit: Payer: Self-pay

## 2020-09-09 ENCOUNTER — Encounter (HOSPITAL_COMMUNITY): Payer: Self-pay

## 2020-09-09 DIAGNOSIS — R1032 Left lower quadrant pain: Secondary | ICD-10-CM | POA: Diagnosis present

## 2020-09-09 DIAGNOSIS — N201 Calculus of ureter: Secondary | ICD-10-CM | POA: Insufficient documentation

## 2020-09-09 DIAGNOSIS — Z79899 Other long term (current) drug therapy: Secondary | ICD-10-CM | POA: Diagnosis not present

## 2020-09-09 DIAGNOSIS — Z96691 Finger-joint replacement of right hand: Secondary | ICD-10-CM | POA: Insufficient documentation

## 2020-09-09 DIAGNOSIS — Z8521 Personal history of malignant neoplasm of larynx: Secondary | ICD-10-CM | POA: Insufficient documentation

## 2020-09-09 DIAGNOSIS — E039 Hypothyroidism, unspecified: Secondary | ICD-10-CM | POA: Diagnosis not present

## 2020-09-09 LAB — CBC WITH DIFFERENTIAL/PLATELET
Abs Immature Granulocytes: 0.08 10*3/uL — ABNORMAL HIGH (ref 0.00–0.07)
Basophils Absolute: 0.1 10*3/uL (ref 0.0–0.1)
Basophils Relative: 1 %
Eosinophils Absolute: 0.5 10*3/uL (ref 0.0–0.5)
Eosinophils Relative: 4 %
HCT: 51.7 % (ref 39.0–52.0)
Hemoglobin: 17.3 g/dL — ABNORMAL HIGH (ref 13.0–17.0)
Immature Granulocytes: 1 %
Lymphocytes Relative: 16 %
Lymphs Abs: 1.9 10*3/uL (ref 0.7–4.0)
MCH: 31.8 pg (ref 26.0–34.0)
MCHC: 33.5 g/dL (ref 30.0–36.0)
MCV: 95 fL (ref 80.0–100.0)
Monocytes Absolute: 1.9 10*3/uL — ABNORMAL HIGH (ref 0.1–1.0)
Monocytes Relative: 16 %
Neutro Abs: 7.9 10*3/uL — ABNORMAL HIGH (ref 1.7–7.7)
Neutrophils Relative %: 62 %
Platelets: 307 10*3/uL (ref 150–400)
RBC: 5.44 MIL/uL (ref 4.22–5.81)
RDW: 12.6 % (ref 11.5–15.5)
WBC: 12.3 10*3/uL — ABNORMAL HIGH (ref 4.0–10.5)
nRBC: 0 % (ref 0.0–0.2)

## 2020-09-09 LAB — URINALYSIS, ROUTINE W REFLEX MICROSCOPIC
Bacteria, UA: NONE SEEN
Bilirubin Urine: NEGATIVE
Glucose, UA: NEGATIVE mg/dL
Hgb urine dipstick: NEGATIVE
Ketones, ur: NEGATIVE mg/dL
Leukocytes,Ua: NEGATIVE
Nitrite: NEGATIVE
Protein, ur: 30 mg/dL — AB
Specific Gravity, Urine: 1.028 (ref 1.005–1.030)
pH: 5 (ref 5.0–8.0)

## 2020-09-09 MED ORDER — OXYCODONE-ACETAMINOPHEN 5-325 MG PO TABS
1.0000 | ORAL_TABLET | Freq: Once | ORAL | Status: AC
  Administered 2020-09-09: 1 via ORAL
  Filled 2020-09-09: qty 1

## 2020-09-09 MED ORDER — MORPHINE SULFATE (PF) 4 MG/ML IV SOLN
4.0000 mg | Freq: Once | INTRAVENOUS | Status: AC
  Administered 2020-09-09: 4 mg via INTRAVENOUS
  Filled 2020-09-09: qty 1

## 2020-09-09 NOTE — ED Triage Notes (Signed)
Patient complains of left sided abdominal pain x 5 days. States that he has been seen x 2 by his MD with diagnosis of strained muscle. Labs normal, xray normal, denies trauma

## 2020-09-09 NOTE — ED Provider Notes (Signed)
Emergency Medicine Provider Triage Evaluation Note  [name] Wu , a 65 y.o. male  was evaluated in triage.  Pt complains of left-sided abdominal pain.  Began 5 days ago after mowing the lawn.  Initially thought to be a muscle strain, symptoms not improving with Motrin.  No fever, nausea, vomiting, urinary symptoms, normal bowel movements.  Review of Systems  Positive: LLQ abd pain Negative: fever  Physical Exam  BP (!) 153/97   Pulse 87   Temp 98.6 F (37 C) (Oral)   Resp (!) 22   SpO2 99%  Gen:   Appear uncomfortable due to pain Resp:  Normal effort  MSK:   Moves extremities without difficulty  Other:  Test palpation of the left side abdomen and left CVA.  Guarding.  Abdomen is tight.  Medical Decision Making  Medically screening exam initiated at 5:59 PM.  Appropriate orders placed.  JEREDIAH LUECHT was informed that the remainder of the evaluation will be completed by another provider, this initial triage assessment does not replace that evaluation, and the importance of remaining in the ED until their evaluation is complete.  Labs and CT   Franchot Heidelberg, PA-C 09/09/20 1800    Daleen Bo, MD 09/09/20 2348

## 2020-09-09 NOTE — ED Notes (Signed)
Pain med given 

## 2020-09-09 NOTE — ED Notes (Signed)
abd pain since last Thursday he has been to urgent care x2 for the same  The last time was earlier today  [ain decreased after some percocet from triage

## 2020-09-09 NOTE — ED Notes (Signed)
Neg labs and xrays so far

## 2020-09-09 NOTE — ED Provider Notes (Signed)
[facility] EMERGENCY DEPARTMENT Provider Note   CSN: [id number] Arrival date & time: 09/09/20  1719     History No chief complaint on file.   [name] Wu is a 65 y.o. male.  Patient with history of throat cancer in remission, OSA presents with LLQ abdominal pain that has been progressively worsening over the last 6 days. No fever, nausea, vomiting, diarrhea. He reports normal bowel movement this morning that did not change the character of the pain. No urinary symptoms. No pain in the scrotum or testicle. Today he started to feel the pain in his left lower back. No bulging of the abdomen and no history of hernia. Recent colonoscopy 4/22 where 2 polyps were removed. No other abnormality.  The history is provided by the patient. No language interpreter was used.      Past Medical History:  Diagnosis Date   Amnesia    after being hitting head playing baseball, lasted 1 night- global unsure of date   Esophageal stenosis    History of radiation therapy 10/27/13- 12/23/13   R oropharynx 7000 cGy, 35 fx, R neck adenopathy 7000 cGy 35 fx, high risk nodal bed 5950 cGy 35 fx, low risk nodal bed 5600 cGy 35 fx   History of stomach ulcers ~ 1983   Malignant neoplasm of base of tongue ([place]) 09/13/13   right base of tongue- inv squamous cell    OSA on CPAP    uses CPAP nightly   Other fatigue 04/23/2014   S/P radiation therapy 10/27/2013 through 12/23/2013                                                      Right oropharynx 7000 cGy in 35 sessions, right neck adenopathy 7000 cGy in 35 sessions.    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   Exposed mandible 11/18/2013   Nausea without vomiting 11/15/2013   [place]   Protein [place] malnutrition [place] 10/30/2013   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   Denture stomatitis 09/24/2013   Throat pain in adult 09/24/2013   Dysphagia 09/24/2013   Elevated serum creatinine 09/24/2013    Past Surgical History:  Pro[place] Laterality Date   [place]   Procedure: DIRECT LARYNGOSCOPY WITH BIOPSY ;  Surgeon: JIzora Gala MD;  [place] [facility]  Service: ENT;  Laterality: N/A;   DIRECT LARYNGOSCOPY N/A 01/12/2015   Procedure: DIRECT LARYNGOSCOPY;  Surgeon: JIzora Gala MD;  [place] [facility]  Service: ENT;  Laterality: N/A;   ESOPHAGOSCOPY N/A 09/12/2013   Procedure: ESOPHAGOSCOPY;  Surgeon: Izora Gala, MD;  Location: [facility];  Service: ENT;  Laterality: N/A;   ESOPHAGOSCOPY WITH DILITATION N/A 01/12/2015   Procedure: ESOPHAGOSCOPY WITH DILITATION;  Surgeon: Izora Gala, MD;  Location: [facility];  Service: ENT;  Laterality: N/A;   FOOT FRACTURE SURGERY Left ~ [place] N/A 10/17/2013   Procedure: LAPAROSCOPIC GASTROJEJUNOSTOMY;  Surgeon: Stark Klein, MD;  Location: [facility];  Service: General;  Laterality: N/A;  Dr. Clyda Greener card was used please make a preference card for Dr. Barry Dienes.   GASTROSTOMY TUBE PLACEMENT Left 10/17/2013   HEEL SPUR SURGERY Left ~ 1995   "shaved bone down; took nerve out"   INTERPHALANGEAL JOINT ARTHROPLASTY Right ` 2008   "middle"   PORTACATH PLACEMENT Left 10/17/2013   power port   PORTACATH PLACEMENT Left 10/17/2013   Procedure: INSERTION PORT-A-CATH;  Surgeon: Stark Klein, MD;   Location: [facility];  Service: General;  Laterality: Left;   TONGUE BIOPSY  09/2013   "@ base of tongue"   UPPER GASTROINTESTINAL ENDOSCOPY     VASECTOMY  1982       Family History  Problem Relation Age of Onset   Cancer Paternal Grandfather    Colon cancer Neg Hx    Esophageal cancer Neg Hx    Prostate cancer Neg Hx    Rectal cancer Neg Hx     Social History   Tobacco Use   Smoking status: Never   Smokeless tobacco: Never  Vaping Use   Vaping Use: Never used  Substance Use Topics   Alcohol use: No   Drug use: No    Home Medications Prior to Admission medications   Medication Sig Start Date End Date Taking? Authorizing Provider  ENSURE PLUS (ENSURE PLUS) LIQD Take 237 mLs by mouth. 6 to 7 cans per day.    [provider]  ibuprofen (ADVIL,MOTRIN) 600 MG tablet Take 600 mg by mouth every 6 (six) hours as needed for headache. Patient not taking: No sig reported    [provider]  levothyroxine (SYNTHROID) 112 MCG tablet Take 1 tablet (112 mcg total) by mouth daily before breakfast. 06/02/15   Holley Bouche, NP  sodium fluoride (FLUORISHIELD) 1.1 % GEL dental gel Place 1 application onto teeth at bedtime.    [provider]    Allergies    Codeine and Velosef [cephradine]  Review of Systems   Review of Systems  Constitutional:  Negative for chills and fever.  HENT: Negative.    Respiratory: Negative.    Cardiovascular: Negative.   Gastrointestinal:  Positive for abdominal pain. Negative for blood in stool, constipation, diarrhea, nausea and vomiting.  Genitourinary:  Negative for dysuria and hematuria.  Musculoskeletal:  Positive for back pain.  Skin: Negative.   Neurological: Negative.    Physical Exam Updated Vital Signs BP 135/86 (BP Location: Left Arm)   Pulse 83   Temp 98.3 F (36.8 C) (Oral)   Resp 20   SpO2 98%   Physical Exam Vitals and nursing note reviewed.  Constitutional:      General: He is not in acute  distress.    Appearance: He is well-developed. He is obese.     Comments: Appears uncomfortable.  HENT:     Head: Normocephalic.  Cardiovascular:     Rate and Rhythm: Normal rate and regular rhythm.     Heart sounds: No murmur heard. Pulmonary:  Effort: Pulmonary effort is normal.     Breath sounds: Normal breath sounds. No wheezing, rhonchi or rales.  Abdominal:     General: Bowel sounds are normal.     Palpations: Abdomen is soft. There is no mass.     Tenderness: There is abdominal tenderness. There is no guarding or rebound.     Hernia: No hernia is present.     Comments: Abdomen tense, not peritonitic. BS active. No mass.   Genitourinary:    Penis: Normal.      Comments: No testicular tenderness.  Musculoskeletal:        General: Normal range of motion.     Cervical back: Normal range of motion and neck supple.  Skin:    General: Skin is warm and dry.  Neurological:     General: No focal deficit present.     Mental Status: He is alert and oriented to person, place, and time.    ED Results / Procedures / Treatments   Labs (all labs ordered are listed, but only abnormal results are displayed) Labs Reviewed  CBC WITH DIFFERENTIAL/PLATELET - Abnormal; Notable for the following components:      Result Value   WBC 12.3 (*)    Hemoglobin 17.3 (*)    Neutro Abs 7.9 (*)    Monocytes Absolute 1.9 (*)    Abs Immature Granulocytes 0.08 (*)    All other components within normal limits  URINALYSIS, ROUTINE W REFLEX MICROSCOPIC - Abnormal; Notable for the following components:   APPearance HAZY (*)    Protein, ur 30 (*)    All other components within normal limits  COMPREHENSIVE METABOLIC PANEL  LIPASE, BLOOD    EKG None  Radiology No results found.  Procedures Procedures   Medications Ordered in ED Medications  oxyCODONE-acetaminophen (PERCOCET/ROXICET) 5-325 MG per tablet 1 tablet (1 tablet Oral Given 09/09/20 1807)    ED Course  I have reviewed the triage  vital signs and the nursing notes.  Pertinent labs & imaging results that were available during my care of the patient were reviewed by me and considered in my medical decision making (see chart for details).    MDM Rules/Calculators/A&P                           Patient to ED with LLQ pain as further detailed in the HPI.   Uncomfortable appearing. Labs pending. IV started, pain addressed. CT ordered w/CM with suspicion of diverticulitis. On recheck pain is controlled.  Labs resulted and reviewed. CT made aware he is cleared for study.   CT shows left either passed stone or distal ureteral (UVJ) stone measuring 3 mm. Patient reports pain is returning suggesting stone is still in the ureter. No infection. Patient and spouse updated on results and plan for discharge.     Final Clinical Impression(s) / ED Diagnoses Final diagnoses:  None   Ureterolithiasis   Rx / DC Orders ED Discharge Orders     None        Charlann Lange, PA-C 09/10/20 0315    Drenda Freeze, MD 09/11/20 1550

## 2020-09-10 ENCOUNTER — Emergency Department (HOSPITAL_COMMUNITY): Payer: Medicare Other

## 2020-09-10 DIAGNOSIS — N201 Calculus of ureter: Secondary | ICD-10-CM | POA: Diagnosis not present

## 2020-09-10 LAB — COMPREHENSIVE METABOLIC PANEL
ALT: 19 U/L (ref 0–44)
AST: 21 U/L (ref 15–41)
Albumin: 3.5 g/dL (ref 3.5–5.0)
Alkaline Phosphatase: 74 U/L (ref 38–126)
Anion gap: 10 (ref 5–15)
BUN: 31 mg/dL — ABNORMAL HIGH (ref 8–23)
CO2: 25 mmol/L (ref 22–32)
Calcium: 8.9 mg/dL (ref 8.9–10.3)
Chloride: 100 mmol/L (ref 98–111)
Creatinine, Ser: 1.62 mg/dL — ABNORMAL HIGH (ref 0.61–1.24)
GFR, Estimated: 47 mL/min — ABNORMAL LOW (ref >60)
Glucose, Bld: 114 mg/dL — ABNORMAL HIGH (ref 70–99)
Potassium: 4.8 mmol/L (ref 3.5–5.1)
Sodium: 135 mmol/L (ref 135–145)
Total Bilirubin: 1.1 mg/dL (ref 0.3–1.2)
Total Protein: 7.3 g/dL (ref 6.5–8.1)

## 2020-09-10 LAB — LIPASE, BLOOD: Lipase: 32 U/L (ref 11–51)

## 2020-09-10 MED ORDER — MORPHINE SULFATE (PF) 4 MG/ML IV SOLN
4.0000 mg | Freq: Once | INTRAVENOUS | Status: AC
  Administered 2020-09-10: 4 mg via INTRAVENOUS
  Filled 2020-09-10: qty 1

## 2020-09-10 MED ORDER — OXYCODONE-ACETAMINOPHEN 5-325 MG PO TABS: 1.0000 | ORAL_TABLET | Freq: Four times a day (QID) | ORAL | 0 refills | Status: DC | PRN

## 2020-09-10 MED ORDER — TAMSULOSIN HCL 0.4 MG PO CAPS: 0.4000 mg | ORAL_CAPSULE | Freq: Every day | ORAL | 0 refills | Status: DC

## 2020-09-10 MED ORDER — KETOROLAC TROMETHAMINE 30 MG/ML IJ SOLN
15.0000 mg | Freq: Once | INTRAMUSCULAR | Status: AC
  Administered 2020-09-10: 15 mg via INTRAVENOUS
  Filled 2020-09-10: qty 1

## 2020-09-10 MED ORDER — ONDANSETRON 4 MG PO TBDP: 4.0000 mg | ORAL_TABLET | Freq: Three times a day (TID) | ORAL | 0 refills | Status: DC | PRN

## 2020-09-10 MED ORDER — IOHEXOL 350 MG/ML SOLN
75.0000 mL | Freq: Once | INTRAVENOUS | Status: AC | PRN
  Administered 2020-09-10: 75 mL via INTRAVENOUS

## 2020-09-10 NOTE — ED Notes (Signed)
Pt asking for pain med 

## 2020-09-10 NOTE — Discharge Instructions (Addendum)
Take medications as prescribed until stone has passed. Return to the emergency department if you develop any fever, uncontrolled pain or uncontrolled vomiting.   If pain has not resolved in 48 hours, please follow up with [facility] for recheck.

## 2021-03-18 ENCOUNTER — Other Ambulatory Visit (HOSPITAL_BASED_OUTPATIENT_CLINIC_OR_DEPARTMENT_OTHER): Payer: Self-pay

## 2021-03-18 DIAGNOSIS — R0681 Apnea, not elsewhere classified: Secondary | ICD-10-CM

## 2021-04-27 ENCOUNTER — Ambulatory Visit (HOSPITAL_BASED_OUTPATIENT_CLINIC_OR_DEPARTMENT_OTHER): Payer: Medicare Other | Attending: *Deleted | Admitting: Internal Medicine

## 2021-04-27 VITALS — Ht 65.0 in | Wt 196.0 lb

## 2021-04-27 DIAGNOSIS — G4733 Obstructive sleep apnea (adult) (pediatric): Secondary | ICD-10-CM | POA: Insufficient documentation

## 2021-04-27 DIAGNOSIS — R0681 Apnea, not elsewhere classified: Secondary | ICD-10-CM

## 2021-05-02 DIAGNOSIS — R0681 Apnea, not elsewhere classified: Secondary | ICD-10-CM | POA: Diagnosis not present

## 2021-05-02 NOTE — Procedures (Signed)
? ? ?  Patient Name: [name] Wu, [name] Wu ?Study Date: 04/27/2021 ?Gender: Male ?D.O.B: [birth year] ?Age (years): 51 ?Referring Provider: Everardo Beals NP ?Height (inches): 65 ?Interpreting Physician: Baird Lyons MD, ABSM ?Weight (lbs): 196 ?RPSGT: Jacolyn Reedy ?BMI: 33 ?MRN: [id number] ?Neck Size: 16.50 ? ?CLINICAL INFORMATION ?Sleep Study Type: HST ?Indication for sleep study: Snoring ?Epworth Sleepiness Score: 5 ? ? ?Most recent polysomnogram dated 01/24/2018 revealed an AHI of 13.7/h and RDI of 13.7/h. ?SLEEP STUDY TECHNIQUE ?A multi-channel overnight portable sleep study was performed. The channels recorded were: nasal airflow, thoracic respiratory movement, and oxygen saturation with a pulse oximetry. Snoring was also monitored. ? ?MEDICATIONS ?Patient self administered medications include: N/A. ? ?SLEEP ARCHITECTURE ?Patient was studied for 362 minutes. The sleep efficiency was 100.0 % and the patient was supine for 35.7%. The arousal index was 0.0 per hour. ? ?RESPIRATORY PARAMETERS ?The overall AHI was 11.4 per hour, with a central apnea index of 0 per hour. ?The oxygen nadir was 85% during sleep. ? ?CARDIAC DATA ?Mean heart rate during sleep was 62.8 bpm. ? ?IMPRESSIONS ?- Mild obstructive sleep apnea occurred during this study (AHI = 11.4/h). ?- Moderate oxygen desaturation was noted during this study (Min O2 = 85%). Mean 95% ?- Patient snored. ? ?DIAGNOSIS ?- Obstructive Sleep Apnea (G47.33) ? ?RECOMMENDATIONS ?- Treatment for mild OSA is directed by symptoms and co-morbidity. Conservative measures may include observation, weight loss and sleep position off back. Other options, including CPAP, a fitted oral appliance or ENT evaluation, would be based on clinical judgment. ?- Be careful with alcohol, sedatives and other CNS depressants that may worsen sleep apnea and disrupt normal sleep architecture. ?- Sleep hygiene should be reviewed to assess factors that may improve sleep quality. ?- Weight  management and regular exercise should be initiated or continued. ? ?[Electronically signed] 05/02/2021 11:22 AM ? ?Baird Lyons MD, ABSM ?Diplomate, [place] Board of Sleep Medicine ? ? ?NPI: [id number] ? ?  ? ? ? ? ? ? ? ? ? ? ? ? ? ? ? ? ? ? ? ? ? ?Dutchess Crosland ?Diplomate, [employer] adviser of Sleep Medicine ? ?ELECTRONICALLY SIGNED ON:  05/02/2021, 11:12 AM ?[place] ?PH: [phone]   FX: [phone] ?ACCREDITED BY THE AMERICAN ACADEMY OF SLEEP MEDICINE ?

## 2022-07-26 ENCOUNTER — Encounter (HOSPITAL_BASED_OUTPATIENT_CLINIC_OR_DEPARTMENT_OTHER): Payer: Self-pay | Admitting: Family Medicine

## 2022-07-26 ENCOUNTER — Ambulatory Visit (INDEPENDENT_AMBULATORY_CARE_PROVIDER_SITE_OTHER): Payer: Medicare PPO | Admitting: Family Medicine

## 2022-07-26 VITALS — BP 151/86 | HR 64 | Ht 65.0 in | Wt 204.1 lb

## 2022-07-26 DIAGNOSIS — G4733 Obstructive sleep apnea (adult) (pediatric): Secondary | ICD-10-CM

## 2022-07-26 DIAGNOSIS — T451X5A Adverse effect of antineoplastic and immunosuppressive drugs, initial encounter: Secondary | ICD-10-CM

## 2022-07-26 DIAGNOSIS — R7989 Other specified abnormal findings of blood chemistry: Secondary | ICD-10-CM

## 2022-07-26 DIAGNOSIS — G620 Drug-induced polyneuropathy: Secondary | ICD-10-CM

## 2022-07-26 DIAGNOSIS — R1310 Dysphagia, unspecified: Secondary | ICD-10-CM

## 2022-07-26 DIAGNOSIS — C01 Malignant neoplasm of base of tongue: Secondary | ICD-10-CM

## 2022-07-26 DIAGNOSIS — Z7689 Persons encountering health services in other specified circumstances: Secondary | ICD-10-CM

## 2022-07-26 DIAGNOSIS — E039 Hypothyroidism, unspecified: Secondary | ICD-10-CM

## 2022-07-26 DIAGNOSIS — Z0000 Encounter for general adult medical examination without abnormal findings: Secondary | ICD-10-CM

## 2022-07-26 DIAGNOSIS — R030 Elevated blood-pressure reading, without diagnosis of hypertension: Secondary | ICD-10-CM

## 2022-07-26 NOTE — Progress Notes (Signed)
New Patient Office Visit  Subjective    Patient ID: [name] Wu, male    DOB: 1955/10/07  Age: 67 y.o. MRN: [id number]  HPI [name] Wu is a 67 year old male who presents to establish care. He does not have any concerns today.   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 6 to 7 cans per day.   levothyroxine (SYNTHROID) 112 MCG tablet Take 1 tablet (112 mcg total) by mouth daily before breakfast.   Sod Fluoride-Potassium Nitrate (PREVIDENT 5000 ENAMEL PROTECT) 1.1-5 % GEL BRUSH 2 TO 3 TIMES PER DAY.   [DISCONTINUED] ibuprofen (ADVIL,MOTRIN) 600 MG tablet Take 600 mg by mouth every 6 (six) hours as needed for headache. (Patient not taking: Reported on 07/26/2022)   [DISCONTINUED] ondansetron (ZOFRAN ODT) 4 MG disintegrating tablet Take 1 tablet (4 mg total) by mouth every 8 (eight) hours as needed for nausea or vomiting.   [DISCONTINUED] oxyCODONE-acetaminophen (PERCOCET/ROXICET) 5-325 MG tablet Take 1 tablet by mouth every 6 (six) hours as needed for severe pain.   [DISCONTINUED] sodium fluoride (FLUORISHIELD) 1.1 % GEL dental gel Place 1 application onto teeth at bedtime. (Patient not taking: Reported on 07/26/2022)   [DISCONTINUED] tamsulosin (FLOMAX) 0.4 MG CAPS capsule Take 1 capsule (0.4 mg total) by mouth daily after breakfast.   Facility-Administered Encounter Medications as of 07/26/2022  Medication   0.9 %  sodium chloride infusion    Past Medical History:  Diagnosis Date   Amnesia    after being hitting head playing baseball, lasted 1 night- global unsure of date   Esophageal stenosis    History of  radiation therapy 10/27/13- 12/23/13   R oropharynx 7000 cGy, 35 fx, R neck adenopathy 7000 cGy 35 fx, high risk nodal bed 5950 cGy 35 fx, low risk nodal bed 5600 cGy 35 fx   History of stomach ulcers ~ 1983   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    High risk nodal bed 5950 cGy in 35 sessions, and low risk nodal bed 5600 cGy in 35 sessions                      Sinusitis 02/03/2014   Sleep apnea    uses CPAP   Thyroid disease    hypothyroid   Tuberculosis 1984   S/P military stay in [place]; [place] x 63yrs; "cleared"    Past Surgical History:  Procedure Laterality Date   APPENDECTOMY  ~ 1967   at [facility]   CHEST WALL TUMOR  EXCISION Left 1977   DIRECT LARYNGOSCOPY N/A 09/12/2013   Procedure: DIRECT LARYNGOSCOPY WITH BIOPSY ;  Surgeon: Serena Colonel, MD;  Location: [facility] OR;  Service: ENT;  Laterality: N/A;   DIRECT LARYNGOSCOPY N/A 01/12/2015   Procedure: DIRECT LARYNGOSCOPY;  Surgeon: Serena Colonel, MD;  Location: [facility];  Service: ENT;  Laterality: N/A;   ESOPHAGOSCOPY N/A 09/12/2013   Procedure: ESOPHAGOSCOPY;  Surgeon: Serena Colonel, MD;  Location: [facility] OR;  Service: ENT;  Laterality: N/A;   ESOPHAGOSCOPY WITH DILITATION N/A 01/12/2015   Procedure: ESOPHAGOSCOPY WITH DILITATION;  Surgeon: Serena Colonel, MD;  Location: [facility];  Service: ENT;  Laterality: N/A;   FOOT FRACTURE SURGERY Left ~ 1995   GASTROJEJUNOSTOMY N/A 10/17/2013   Procedure: LAPAROSCOPIC GASTROJEJUNOSTOMY;  Surgeon: Almond Lint, MD;  Location: [facility] OR;  Service: General;  Laterality: N/A;  Dr. Gordy Savers card was used please make a preference card for Dr. Donell Beers.   GASTROSTOMY TUBE PLACEMENT Left  10/17/2013   HEEL SPUR SURGERY Left ~ 1995   "shaved bone down; took nerve out"   INTERPHALANGEAL JOINT ARTHROPLASTY Right ` 2008   "middle"   PORTACATH PLACEMENT Left 10/17/2013   power port   PORTACATH PLACEMENT Left 10/17/2013   Procedure: INSERTION PORT-A-CATH;  Surgeon: Almond Lint, MD;  Location: [facility] OR;  Service: General;  Laterality: Left;   TONGUE BIOPSY  09/2013   "@ base of tongue"   UPPER GASTROINTESTINAL ENDOSCOPY     VASECTOMY  1982    Family History  Problem Relation Age of Onset   Cancer Paternal Grandfather    Colon cancer Neg Hx    Esophageal cancer Neg Hx    Prostate cancer Neg Hx    Rectal cancer Neg Hx      Review of Systems  Constitutional:  Negative for malaise/fatigue and weight loss.  Respiratory:  Negative for cough and shortness of breath.   Cardiovascular:  Negative for chest pain and palpitations.  Gastrointestinal:  Negative for abdominal pain, nausea and vomiting.  Musculoskeletal:  Negative for myalgias.  Neurological:  Negative for dizziness, weakness and headaches.  Psychiatric/Behavioral:  Negative for depression and suicidal ideas. The patient is not nervous/anxious and does not have insomnia.     Objective    BP (!) 151/86 Comment: Repeat BP  Pulse 64   Ht 5\' 5"  (1.651 m)   Wt 204 lb 1.6 oz (92.6 kg)   SpO2 100%   BMI 33.96 kg/m   Physical Exam Constitutional:      Appearance: Normal appearance.  Cardiovascular:     Rate and Rhythm: Normal rate and regular rhythm.     Pulses: Normal pulses.     Heart sounds: Normal heart sounds.  Pulmonary:     Effort: Pulmonary effort is normal.     Breath sounds: Normal breath sounds.  Neurological:     Mental Status: He is alert.  Psychiatric:        Mood and Affect: Mood normal.        Behavior: Behavior normal.        Thought Content: Thought content normal.        Judgment: Judgment normal.     Assessment & Plan:   1. Encounter to establish care Patient presents today to  establish care with new primary care provider. I reviewed the past medical history, family history, social history, surgical history, and allergies today. Patient has no concerns today.   2. Elevated serum creatinine Review of most recent labs  from 09/08/2020 showed elevated serum creatinine. Patient does not report any history of kidney disease. Plan to reassess serum creatinine and EGFR today. Discussed importance of blood pressure control to prevent further kidney damage.  - Comprehensive metabolic panel  3. 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 He is currently taking levothyroxine 112 mcg daily before breakfast. Plan to assess TSH today.  - TSH Rfx on Abnormal to Free T4  4. Peripheral neuropathy due to chemotherapy [facility]) Patient has history of neuropathy in both of his lower extremities, due to multiple rounds of chemotherapy. He is not currently on any medication regimen. Advised patient to return to office if he would like to try medication therapy.   5. OSA on CPAP Patient underwent sleep study in 2023 through the Department of [facility]. He reports daily adherence to CPAP.   6. Elevated blood pressure reading in office without diagnosis of hypertension Patient presents today with elevated blood pressure. Patient in no acute distress and is well-appearing. Denies chest pain, shortness of breath, lower extremity edema, vision changes, headaches. Cardiovascular exam with heart regular rate and rhythm. Normal heart sounds, no murmurs present. No lower extremity edema present. Lungs clear to auscultation bilaterally. Patient is not currently on antihypertensives. Discussed importance of lifestyle modifications- including low sodium intake, 30 minutes of aerobic exercise at least 3-5 times each week, and Advised patient to monitor blood pressure at home.   7. Wellness examination Plan to complete CPE in near future. Ordered routine  HCM labs.  - CBC with Differential/Platelet - Hemoglobin A1c - Lipid panel  8. Difficulty swallowing pills Due to history of malignant neoplasm of the base of the tongue, patient does have a difficult time swallowing large pills and eating certain types of foods. Patient is able to swallow small pills but requires liquid form if pills are larger. Patient continues to drink Ensure supplements if he does not want to eat a meal. No concerns for malnutrition present during physical exam.    9. Malignant neoplasm of base of tongue ([facility]) Patient has a history of malignant neoplasm of base of tongue. Initially diagnosed in 2015. Denies current dysgeusia, xerostomia. Reports dysphagia with large pills and certain food textures. Followed by [facility].   Return in about 3 months (around 10/26/2022) for HTN follow-up.   Alyson Reedy, FNP

## 2022-07-27 ENCOUNTER — Encounter (HOSPITAL_BASED_OUTPATIENT_CLINIC_OR_DEPARTMENT_OTHER): Payer: Self-pay | Admitting: Family Medicine

## 2022-07-27 LAB — CBC WITH DIFFERENTIAL/PLATELET
Basophils Absolute: 0.1 10*3/uL (ref 0.0–0.2)
Basos: 1 %
EOS (ABSOLUTE): 0.3 10*3/uL (ref 0.0–0.4)
Eos: 4 %
Hematocrit: 51.4 % — ABNORMAL HIGH (ref 37.5–51.0)
Hemoglobin: 16.9 g/dL (ref 13.0–17.7)
Immature Grans (Abs): 0 10*3/uL (ref 0.0–0.1)
Immature Granulocytes: 0 %
Lymphocytes Absolute: 2 10*3/uL (ref 0.7–3.1)
Lymphs: 26 %
MCH: 31.1 pg (ref 26.6–33.0)
MCHC: 32.9 g/dL (ref 31.5–35.7)
MCV: 95 fL (ref 79–97)
Monocytes Absolute: 1 10*3/uL — ABNORMAL HIGH (ref 0.1–0.9)
Monocytes: 13 %
Neutrophils Absolute: 4.4 10*3/uL (ref 1.4–7.0)
Neutrophils: 56 %
Platelets: 251 10*3/uL (ref 150–450)
RBC: 5.44 x10E6/uL (ref 4.14–5.80)
RDW: 12.8 % (ref 11.6–15.4)
WBC: 7.8 10*3/uL (ref 3.4–10.8)

## 2022-07-27 LAB — LIPID PANEL
Chol/HDL Ratio: 3.3 ratio (ref 0.0–5.0)
Cholesterol, Total: 187 mg/dL (ref 100–199)
HDL: 57 mg/dL (ref >39)
LDL Chol Calc (NIH): 112 mg/dL — ABNORMAL HIGH (ref 0–99)
Triglycerides: 98 mg/dL (ref 0–149)
VLDL Cholesterol Cal: 18 mg/dL (ref 5–40)

## 2022-07-27 LAB — COMPREHENSIVE METABOLIC PANEL
ALT: 22 IU/L (ref 0–44)
AST: 19 IU/L (ref 0–40)
Albumin: 4.4 g/dL (ref 3.9–4.9)
Alkaline Phosphatase: 81 IU/L (ref 44–121)
BUN/Creatinine Ratio: 16 (ref 10–24)
BUN: 21 mg/dL (ref 8–27)
Bilirubin Total: 0.9 mg/dL (ref 0.0–1.2)
CO2: 25 mmol/L (ref 20–29)
Calcium: 9.9 mg/dL (ref 8.6–10.2)
Chloride: 104 mmol/L (ref 96–106)
Creatinine, Ser: 1.33 mg/dL — ABNORMAL HIGH (ref 0.76–1.27)
Globulin, Total: 2.7 g/dL (ref 1.5–4.5)
Glucose: 99 mg/dL (ref 70–99)
Potassium: 5.7 mmol/L — ABNORMAL HIGH (ref 3.5–5.2)
Sodium: 141 mmol/L (ref 134–144)
Total Protein: 7.1 g/dL (ref 6.0–8.5)
eGFR: 59 mL/min/{1.73_m2} — ABNORMAL LOW (ref >59)

## 2022-07-27 LAB — HEMOGLOBIN A1C
Est. average glucose Bld gHb Est-mCnc: 126 mg/dL
Hgb A1c MFr Bld: 6 % — ABNORMAL HIGH (ref 4.8–5.6)

## 2022-07-27 LAB — TSH RFX ON ABNORMAL TO FREE T4: TSH: 2.09 u[IU]/mL (ref 0.450–4.500)

## 2022-07-29 DIAGNOSIS — R030 Elevated blood-pressure reading, without diagnosis of hypertension: Secondary | ICD-10-CM | POA: Insufficient documentation

## 2022-07-29 DIAGNOSIS — R1310 Dysphagia, unspecified: Secondary | ICD-10-CM | POA: Insufficient documentation

## 2022-07-29 DIAGNOSIS — G4733 Obstructive sleep apnea (adult) (pediatric): Secondary | ICD-10-CM | POA: Insufficient documentation

## 2022-07-29 DIAGNOSIS — G620 Drug-induced polyneuropathy: Secondary | ICD-10-CM | POA: Insufficient documentation

## 2022-10-13 ENCOUNTER — Encounter (HOSPITAL_BASED_OUTPATIENT_CLINIC_OR_DEPARTMENT_OTHER): Payer: Self-pay | Admitting: Family Medicine

## 2022-10-13 ENCOUNTER — Other Ambulatory Visit (HOSPITAL_BASED_OUTPATIENT_CLINIC_OR_DEPARTMENT_OTHER): Payer: Self-pay

## 2022-10-13 DIAGNOSIS — C01 Malignant neoplasm of base of tongue: Secondary | ICD-10-CM

## 2022-10-13 DIAGNOSIS — E039 Hypothyroidism, unspecified: Secondary | ICD-10-CM

## 2022-10-13 MED ORDER — LEVOTHYROXINE SODIUM 112 MCG PO TABS: 112.0000 ug | ORAL_TABLET | Freq: Every day | ORAL | 11 refills | Status: DC

## 2022-10-26 ENCOUNTER — Ambulatory Visit (INDEPENDENT_AMBULATORY_CARE_PROVIDER_SITE_OTHER): Payer: Medicare PPO | Admitting: Family Medicine

## 2022-10-26 ENCOUNTER — Encounter (HOSPITAL_BASED_OUTPATIENT_CLINIC_OR_DEPARTMENT_OTHER): Payer: Self-pay | Admitting: Family Medicine

## 2022-10-26 VITALS — BP 142/81 | HR 74 | Ht 65.0 in | Wt 208.0 lb

## 2022-10-26 DIAGNOSIS — E039 Hypothyroidism, unspecified: Secondary | ICD-10-CM | POA: Diagnosis not present

## 2022-10-26 DIAGNOSIS — Z820 Family history of epilepsy and other diseases of the nervous system: Secondary | ICD-10-CM

## 2022-10-26 DIAGNOSIS — R030 Elevated blood-pressure reading, without diagnosis of hypertension: Secondary | ICD-10-CM

## 2022-10-26 DIAGNOSIS — R7303 Prediabetes: Secondary | ICD-10-CM

## 2022-10-26 NOTE — Assessment & Plan Note (Signed)
Hemoglobin A1c last assessed 3 months ago with a result of 6.0. Discussed importance of lifestyle modifications- including healthy diet and daily exercise. Plan to reassess hemoglobin A1c in another three months.

## 2022-10-26 NOTE — Assessment & Plan Note (Addendum)
Patient reports a significant family history of Alzheimer's disease and is interested in genetic testing. Discussed with patient that we do not typically do this testing, as it can cause more psychological stress. However, I can refer patient to discuss this with genetic counselor if he wishes.

## 2022-10-26 NOTE — Assessment & Plan Note (Signed)
Patient presents today with decent blood pressure for his age. Patient in no acute distress and is well-appearing. Denies chest pain, shortness of breath, lower extremity edema, vision changes, headaches. Cardiovascular exam with heart regular rate and rhythm. Normal heart sounds, no murmurs present. No lower extremity edema present. Lungs clear to auscultation bilaterally. Patient is currently not on pharmacotherapy. Discussed lifestyle modifications- including low sodium intake and daily aerobic exercise.

## 2022-10-26 NOTE — Progress Notes (Signed)
Established Patient Office Visit  Subjective   Patient ID: [name], male    DOB: Jun 20, 1955  Age: 67 y.o. MRN: [id number]  HYPERTENSION: [name] Wu is a 67 year-old male patient who presents for the management of elevated blood pressure.  Patient's current hypertension medication regimen is: no meds Patient is not taking any prescribed medications for HTN.  Patient is not regularly keeping a check on BP at home.  Has decreased sodium intake, wife is helping him adhere to it. His diet is minimal due to his history of tongue cancer and inability to swallow/chew various foods.  Drinking more water, does drink about 2 sodas/day  Denies headache, dizziness, CP, SHOB, vision changes.   He reports he joined the gym downstairs- has been going since Jan 2024. Does a lot of walking.  A1c- 3 months ago was 6.0 He has decreased intake of Ensures (from 5 to 2) due to sugar intake.    Hypothyroidism- has been on , but received rx for  He has been taking daily QAC   BP Readings from Last 3 Encounters:  10/26/22 (!) 142/81  07/26/22 (!) 151/86  09/10/20 118/84    Review of Systems  Constitutional:  Negative for malaise/fatigue.  Eyes:  Negative for blurred vision and double vision.  Respiratory:  Negative for cough and shortness of breath.   Cardiovascular:  Negative for chest pain, palpitations and leg swelling.  Gastrointestinal:  Negative for abdominal pain, nausea and vomiting.  Musculoskeletal:  Negative for myalgias.  Neurological:  Negative for dizziness, weakness and headaches.  Psychiatric/Behavioral:  Negative for depression and suicidal ideas. The patient is not nervous/anxious.     Objective:     BP (!) 142/81   Pulse 74   Ht 5\' 5"  (1.651 m)   Wt 208 lb (94.3 kg)   SpO2 100%   BMI 34.61 kg/m  BP Readings from Last 3 Encounters:  10/26/22 (!) 142/81  07/26/22 (!) 151/86  09/10/20 118/84     Physical Exam Constitutional:       Appearance: Normal appearance.  Cardiovascular:     Rate and Rhythm: Normal rate and regular rhythm.     Pulses: Normal pulses.     Heart sounds: Normal heart sounds.  Pulmonary:     Effort: Pulmonary effort is normal.     Breath sounds: Normal breath sounds.  Neurological:     Mental Status: He is alert.  Psychiatric:        Mood and Affect: Mood normal.        Behavior: Behavior normal.        Thought Content: Thought content normal.        Judgment: Judgment normal.     Assessment & Plan:  Elevated blood pressure reading in office without diagnosis of hypertension Assessment & Plan: Patient presents today with decent blood pressure for his age. Patient in no acute distress and is well-appearing. Denies chest pain, shortness of breath, lower extremity edema, vision changes, headaches. Cardiovascular exam with heart regular rate and rhythm. Normal heart sounds, no murmurs present. No lower extremity edema present. Lungs clear to auscultation bilaterally. Patient is currently not on pharmacotherapy. Discussed lifestyle modifications- including low sodium intake and daily aerobic exercise.    Hypothyroidism (acquired) Assessment & Plan: Patient has history of acquired hypothyroidism. Denies current symptoms. He received dose for levothyroxine and reports he has been taking this for the past 4 days. Reports he normally has been on .  Plan to assess thyroid function in 6-8 weeks and make adjustments to medication as needed.   Orders: -     [id number]; Future  Prediabetes Assessment & Plan: Hemoglobin A1c last assessed 3 months ago with a result of 6.0. Discussed importance of lifestyle modifications- including healthy diet and daily exercise. Plan to reassess hemoglobin A1c in another three months.    Family history of Alzheimer's disease Assessment & Plan: Patient reports a significant family history of Alzheimer's disease and is interested in genetic testing.  Discussed with patient that we do not typically do this testing, as it can cause more psychological stress. However, I can refer patient to discuss this with genetic counselor if he wishes.       Return in about 6 weeks (around 12/07/2022) for nurse visit for BP check & labs for thyroid .    [name] Reedy, FNP

## 2022-10-26 NOTE — Assessment & Plan Note (Signed)
Patient has history of acquired hypothyroidism. Denies current symptoms. He received dose for levothyroxine and reports he has been taking this for the past 4 days. Reports he normally has been on . Plan to assess thyroid function in 6-8 weeks and make adjustments to medication as needed.

## 2022-12-01 ENCOUNTER — Encounter (HOSPITAL_BASED_OUTPATIENT_CLINIC_OR_DEPARTMENT_OTHER): Payer: Self-pay | Admitting: Family Medicine

## 2022-12-02 ENCOUNTER — Other Ambulatory Visit (HOSPITAL_BASED_OUTPATIENT_CLINIC_OR_DEPARTMENT_OTHER): Payer: Self-pay | Admitting: Family Medicine

## 2022-12-02 ENCOUNTER — Ambulatory Visit (INDEPENDENT_AMBULATORY_CARE_PROVIDER_SITE_OTHER): Payer: Medicare PPO | Admitting: *Deleted

## 2022-12-02 VITALS — BP 132/76 | HR 72 | Ht 65.0 in | Wt 208.0 lb

## 2022-12-02 DIAGNOSIS — R030 Elevated blood-pressure reading, without diagnosis of hypertension: Secondary | ICD-10-CM | POA: Diagnosis not present

## 2022-12-02 NOTE — Progress Notes (Signed)
Patient was seen to have BP checked. BP obtained and documented.

## 2022-12-03 ENCOUNTER — Other Ambulatory Visit (HOSPITAL_BASED_OUTPATIENT_CLINIC_OR_DEPARTMENT_OTHER): Payer: Self-pay | Admitting: Family Medicine

## 2022-12-03 ENCOUNTER — Other Ambulatory Visit (HOSPITAL_COMMUNITY): Payer: Self-pay

## 2022-12-03 DIAGNOSIS — C01 Malignant neoplasm of base of tongue: Secondary | ICD-10-CM

## 2022-12-03 DIAGNOSIS — E039 Hypothyroidism, unspecified: Secondary | ICD-10-CM

## 2022-12-03 LAB — TSH+T4F+T3FREE
Free T4: 1.04 ng/dL (ref 0.82–1.77)
T3, Free: 2.5 pg/mL (ref 2.0–4.4)
TSH: 10.9 u[IU]/mL — ABNORMAL HIGH (ref 0.450–4.500)

## 2022-12-03 MED ORDER — LEVOTHYROXINE SODIUM 125 MCG PO TABS
125.0000 ug | ORAL_TABLET | Freq: Every day | ORAL | 1 refills | Status: DC
  Filled 2022-12-03: qty 30, 30d supply, fill #0

## 2022-12-05 ENCOUNTER — Encounter (HOSPITAL_BASED_OUTPATIENT_CLINIC_OR_DEPARTMENT_OTHER): Payer: Self-pay

## 2022-12-05 ENCOUNTER — Other Ambulatory Visit (HOSPITAL_BASED_OUTPATIENT_CLINIC_OR_DEPARTMENT_OTHER): Payer: Self-pay

## 2022-12-05 DIAGNOSIS — C01 Malignant neoplasm of base of tongue: Secondary | ICD-10-CM

## 2022-12-05 DIAGNOSIS — E039 Hypothyroidism, unspecified: Secondary | ICD-10-CM

## 2022-12-05 MED ORDER — LEVOTHYROXINE SODIUM 125 MCG PO TABS: 125.0000 ug | ORAL_TABLET | Freq: Every day | ORAL | 1 refills | Status: DC

## 2022-12-08 ENCOUNTER — Ambulatory Visit (HOSPITAL_BASED_OUTPATIENT_CLINIC_OR_DEPARTMENT_OTHER): Payer: TRICARE For Life (TFL)

## 2022-12-09 ENCOUNTER — Other Ambulatory Visit (HOSPITAL_COMMUNITY): Payer: Self-pay

## 2022-12-22 ENCOUNTER — Other Ambulatory Visit: Payer: Self-pay

## 2022-12-29 ENCOUNTER — Encounter (HOSPITAL_BASED_OUTPATIENT_CLINIC_OR_DEPARTMENT_OTHER): Payer: Self-pay | Admitting: Family Medicine

## 2023-01-23 ENCOUNTER — Ambulatory Visit (INDEPENDENT_AMBULATORY_CARE_PROVIDER_SITE_OTHER): Payer: Medicare PPO

## 2023-01-23 ENCOUNTER — Encounter: Payer: Self-pay | Admitting: Podiatry

## 2023-01-23 ENCOUNTER — Ambulatory Visit (INDEPENDENT_AMBULATORY_CARE_PROVIDER_SITE_OTHER): Payer: Medicare PPO | Admitting: Podiatry

## 2023-01-23 DIAGNOSIS — M778 Other enthesopathies, not elsewhere classified: Secondary | ICD-10-CM

## 2023-01-23 DIAGNOSIS — M7751 Other enthesopathy of right foot: Secondary | ICD-10-CM | POA: Diagnosis not present

## 2023-01-23 DIAGNOSIS — M21621 Bunionette of right foot: Secondary | ICD-10-CM

## 2023-01-23 MED ORDER — TRIAMCINOLONE ACETONIDE 10 MG/ML IJ SUSP
10.0000 mg | Freq: Once | INTRAMUSCULAR | Status: AC
  Administered 2023-01-23: 10 mg via INTRA_ARTICULAR

## 2023-01-25 NOTE — Progress Notes (Signed)
Subjective:   Patient ID: [name] Wu, male   DOB: 67 y.o.   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  Patient does not smoke and tries to be active   Review of Systems  All other systems reviewed and are negative.       Objective:  Physical Exam Vitals and nursing note reviewed.  Constitutional:      Appearance: He is well-developed.  Pulmonary:     Effort: Pulmonary effort is normal.  Musculoskeletal:        General: Normal range of motion.  Skin:    General: Skin is warm.  Neurological:     Mental Status: He is alert.     Neurovascular status found to be intact muscle strength was found to be adequate range of motion adequate with the patient noted to have fluid buildup with keratotic lesion and pain of the fifth metatarsal head right with lesion present also deep underneath the tissue.  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

## 2023-02-03 ENCOUNTER — Encounter (HOSPITAL_BASED_OUTPATIENT_CLINIC_OR_DEPARTMENT_OTHER): Payer: Self-pay | Admitting: Family Medicine

## 2023-02-03 DIAGNOSIS — E039 Hypothyroidism, unspecified: Secondary | ICD-10-CM

## 2023-02-04 LAB — TSH+T4F+T3FREE
Free T4: 1.41 ng/dL (ref 0.82–1.77)
T3, Free: 2.5 pg/mL (ref 2.0–4.4)
TSH: 5.11 u[IU]/mL — ABNORMAL HIGH (ref 0.450–4.500)

## 2023-02-06 ENCOUNTER — Encounter (HOSPITAL_BASED_OUTPATIENT_CLINIC_OR_DEPARTMENT_OTHER): Payer: Self-pay | Admitting: Family Medicine

## 2023-02-06 ENCOUNTER — Other Ambulatory Visit (HOSPITAL_BASED_OUTPATIENT_CLINIC_OR_DEPARTMENT_OTHER): Payer: Self-pay

## 2023-02-06 DIAGNOSIS — E039 Hypothyroidism, unspecified: Secondary | ICD-10-CM

## 2023-02-06 DIAGNOSIS — C01 Malignant neoplasm of base of tongue: Secondary | ICD-10-CM

## 2023-02-06 MED ORDER — LEVOTHYROXINE SODIUM 125 MCG PO TABS: 125.0000 ug | ORAL_TABLET | Freq: Every day | ORAL | 0 refills | Status: DC

## 2023-05-19 ENCOUNTER — Other Ambulatory Visit (HOSPITAL_BASED_OUTPATIENT_CLINIC_OR_DEPARTMENT_OTHER): Payer: Self-pay | Admitting: Family Medicine

## 2023-05-19 DIAGNOSIS — E039 Hypothyroidism, unspecified: Secondary | ICD-10-CM

## 2023-05-19 DIAGNOSIS — C01 Malignant neoplasm of base of tongue: Secondary | ICD-10-CM

## 2023-05-24 ENCOUNTER — Other Ambulatory Visit (HOSPITAL_BASED_OUTPATIENT_CLINIC_OR_DEPARTMENT_OTHER): Payer: Self-pay | Admitting: Family Medicine

## 2023-05-24 DIAGNOSIS — C01 Malignant neoplasm of base of tongue: Secondary | ICD-10-CM

## 2023-05-24 DIAGNOSIS — E039 Hypothyroidism, unspecified: Secondary | ICD-10-CM

## 2023-05-25 ENCOUNTER — Encounter: Payer: Self-pay | Admitting: Podiatry

## 2023-05-25 ENCOUNTER — Telehealth (HOSPITAL_BASED_OUTPATIENT_CLINIC_OR_DEPARTMENT_OTHER): Payer: Self-pay | Admitting: *Deleted

## 2023-05-25 ENCOUNTER — Telehealth (HOSPITAL_BASED_OUTPATIENT_CLINIC_OR_DEPARTMENT_OTHER): Payer: Self-pay | Admitting: Family Medicine

## 2023-05-25 ENCOUNTER — Other Ambulatory Visit (HOSPITAL_COMMUNITY): Payer: Self-pay

## 2023-05-25 ENCOUNTER — Other Ambulatory Visit (HOSPITAL_BASED_OUTPATIENT_CLINIC_OR_DEPARTMENT_OTHER): Payer: Self-pay

## 2023-05-25 DIAGNOSIS — E039 Hypothyroidism, unspecified: Secondary | ICD-10-CM

## 2023-05-25 DIAGNOSIS — C01 Malignant neoplasm of base of tongue: Secondary | ICD-10-CM

## 2023-05-25 MED ORDER — LEVOTHYROXINE SODIUM 125 MCG PO TABS
125.0000 ug | ORAL_TABLET | Freq: Every day | ORAL | 0 refills | Status: DC
  Filled 2023-05-25 (×3): qty 30, 30d supply, fill #0

## 2023-05-25 NOTE — Telephone Encounter (Signed)
 Pt wife renee notified with verbal understanding

## 2023-05-25 NOTE — Telephone Encounter (Signed)
 Pt's wife is upset about levothyroxine not being able to be refilled without being seen, pt has not been seen in office since 2024 and stated she was coming to the office with paperwork from his recent [place] visit that states he needs a refill for his medication please advise

## 2023-05-25 NOTE — Telephone Encounter (Signed)
 Patient was a [name] Wu patient. Will need to have a follow up appointment with first available provider to have medication filled.  Please call patient and get appt scheduled

## 2023-05-25 NOTE — Telephone Encounter (Signed)
 Copied from CRM [id number]. Topic: Clinical - Prescription Issue >> May 25, 2023  9:44 AM Zipporah Him wrote: Reason for CRM: Patient wife states they have been coming into the office to try and get medication refills and nothing has been sent over to the pharmacy. I did not see any notes as they have been going directly to the office to handle the issue. Called [facility] as patients wife insists to speak directly with the office. CAL advised that the patient needed to schedule for am appointment, hasn't been seen since 01/2023. Patient just recently had blood work done at [place] and they need his levothyroxine refilled, doesn't understand why he needs to be seen again. He has been out of these meds for some time. Please call as wife is incredibly frustrated and needs assistance on how to go about getting this filled ASAP.

## 2023-06-06 ENCOUNTER — Ambulatory Visit (INDEPENDENT_AMBULATORY_CARE_PROVIDER_SITE_OTHER): Admitting: Family Medicine

## 2023-06-06 ENCOUNTER — Encounter (HOSPITAL_BASED_OUTPATIENT_CLINIC_OR_DEPARTMENT_OTHER): Payer: Self-pay | Admitting: Family Medicine

## 2023-06-06 VITALS — BP 133/89 | HR 100 | Ht 65.0 in | Wt 205.8 lb

## 2023-06-06 DIAGNOSIS — Z7729 Contact with and (suspected ) exposure to other hazardous substances: Secondary | ICD-10-CM | POA: Insufficient documentation

## 2023-06-06 DIAGNOSIS — R7303 Prediabetes: Secondary | ICD-10-CM

## 2023-06-06 DIAGNOSIS — E039 Hypothyroidism, unspecified: Secondary | ICD-10-CM | POA: Diagnosis not present

## 2023-06-06 DIAGNOSIS — L570 Actinic keratosis: Secondary | ICD-10-CM | POA: Insufficient documentation

## 2023-06-06 DIAGNOSIS — S6990XA Unspecified injury of unspecified wrist, hand and finger(s), initial encounter: Secondary | ICD-10-CM | POA: Insufficient documentation

## 2023-06-06 DIAGNOSIS — H9190 Unspecified hearing loss, unspecified ear: Secondary | ICD-10-CM | POA: Insufficient documentation

## 2023-06-06 DIAGNOSIS — R030 Elevated blood-pressure reading, without diagnosis of hypertension: Secondary | ICD-10-CM | POA: Diagnosis not present

## 2023-06-06 MED ORDER — LEVOTHYROXINE SODIUM 125 MCG PO TABS: 125.0000 ug | ORAL_TABLET | Freq: Every day | ORAL | 1 refills | Status: DC

## 2023-06-06 NOTE — Assessment & Plan Note (Signed)
 Prior A1c within prediabetes range.  Primarily managing with lifestyle modifications.  No current concerns, denies any issues with polyuria or polydipsia. Recommend continue with lifestyle modifications, patient has generally been remaining physically active and has been focusing on dietary adjustments as well. We will continue with intermittent monitoring of A1c moving forward

## 2023-06-06 NOTE — Assessment & Plan Note (Signed)
 Blood pressure borderline elevated in office, however did improve on recheck.  He does report that he has been checking his blood pressure at home and that home readings have been better controlled than readings here in the office today.  No current issues with chest pain or headaches. At this time, can continue with lifestyle modifications and intermittent monitoring of blood pressure at home, DASH diet. We will continue to monitor blood pressure closely in the office as well.

## 2023-06-06 NOTE — Progress Notes (Signed)
    Procedures performed today:    None.  Independent interpretation of notes and tests performed by another provider:   None.  Brief History, Exam, Impression, and Recommendations:    BP 133/89 (BP Location: Right Arm, Patient Position: Sitting, Cuff Size: Large)   Pulse 100   Ht 5\' 5"  (1.651 m)   Wt 205 lb 12.8 oz (93.4 kg)   SpO2 99%   BMI 34.25 kg/m   Hypothyroidism (acquired) Assessment & Plan: Recent TSH was slightly above normal range.  We did increase dose of levothyroxine  to 125 mcg and patient has been continuing with this. Given duration of patient has been utilizing this medication, we will plan to recheck TSH in about 4 to 6 weeks from now to assess progress and determine if further changes to dose are needed.  Orders: -     Levothyroxine  Sodium; Take 1 tablet (125 mcg total) by mouth daily before breakfast.  Dispense: 30 tablet; Refill: 1  Prediabetes Assessment & Plan: Prior A1c within prediabetes range.  Primarily managing with lifestyle modifications.  No current concerns, denies any issues with polyuria or polydipsia. Recommend continue with lifestyle modifications, patient has generally been remaining physically active and has been focusing on dietary adjustments as well. We will continue with intermittent monitoring of A1c moving forward   Elevated blood pressure reading in office without diagnosis of hypertension Assessment & Plan: Blood pressure borderline elevated in office, however did improve on recheck.  He does report that he has been checking his blood pressure at home and that home readings have been better controlled than readings here in the office today.  No current issues with chest pain or headaches. At this time, can continue with lifestyle modifications and intermittent monitoring of blood pressure at home, DASH diet. We will continue to monitor blood pressure closely in the office as well.   Return in about 4 months (around 10/06/2023) for  CPE with fasting labs 1 week prior.   ___________________________________________ Tuan Tippin de Peru, MD, ABFM, CAQSM Primary Care and Sports Medicine [facility]

## 2023-06-06 NOTE — Assessment & Plan Note (Signed)
 Recent TSH was slightly above normal range.  We did increase dose of levothyroxine  to 125 mcg and patient has been continuing with this. Given duration of patient has been utilizing this medication, we will plan to recheck TSH in about 4 to 6 weeks from now to assess progress and determine if further changes to dose are needed.

## 2023-06-06 NOTE — Patient Instructions (Signed)
  Medication Instructions:  Your physician recommends that you continue on your current medications as directed. Please refer to the Current Medication list given to you today. --If you need a refill on any your medications before your next appointment, please call your pharmacy first. If no refills are authorized on file call the office.-- Lab Work: Your physician has recommended that you have lab work today: 4-6 weeks / 1 week before physical appointment  If you have labs (blood work) drawn today and your tests are completely normal, you will receive your results via MyChart message OR a phone call from our staff.  Please ensure you check your voicemail in the event that you authorized detailed messages to be left on a delegated number. If you have any lab test that is abnormal or we need to change your treatment, we will call you to review the results.  Follow-Up: Your next appointment:   Your physician recommends that you schedule a follow-up appointment in: 4-6 month physical  with Dr. de Peru  You will receive a text message or e-mail with a link to a survey about your care and experience with us  today! We would greatly appreciate your feedback!   Thanks for letting us  be apart of your health journey!!  Primary Care and Sports Medicine   Dr. Court Distance Peru   We encourage you to activate your patient portal called "MyChart".  Sign up information is provided on this After Visit Summary.  MyChart is used to connect with patients for Virtual Visits (Telemedicine).  Patients are able to view lab/test results, encounter notes, upcoming appointments, etc.  Non-urgent messages can be sent to your provider as well. To learn more about what you can do with MyChart, please visit --  [email or web address].

## 2023-06-09 ENCOUNTER — Encounter: Payer: Self-pay | Admitting: Internal Medicine

## 2023-06-12 ENCOUNTER — Telehealth (HOSPITAL_COMMUNITY): Payer: Self-pay | Admitting: *Deleted

## 2023-06-12 NOTE — Telephone Encounter (Signed)
 Attempted to contact patient to schedule OP MBS. Left VM @ [phone]. RKEEL

## 2023-06-13 ENCOUNTER — Other Ambulatory Visit (HOSPITAL_COMMUNITY): Payer: Self-pay | Admitting: Internal Medicine

## 2023-06-13 DIAGNOSIS — R1310 Dysphagia, unspecified: Secondary | ICD-10-CM

## 2023-06-13 DIAGNOSIS — R059 Cough, unspecified: Secondary | ICD-10-CM

## 2023-06-16 ENCOUNTER — Encounter: Payer: Self-pay | Admitting: Neurology

## 2023-06-16 ENCOUNTER — Other Ambulatory Visit: Payer: Self-pay

## 2023-06-16 DIAGNOSIS — R202 Paresthesia of skin: Secondary | ICD-10-CM

## 2023-06-19 ENCOUNTER — Encounter (HOSPITAL_COMMUNITY)

## 2023-06-28 ENCOUNTER — Ambulatory Visit (HOSPITAL_COMMUNITY): Admission: RE | Admit: 2023-06-28 | Source: Ambulatory Visit

## 2023-06-28 ENCOUNTER — Ambulatory Visit (HOSPITAL_COMMUNITY)

## 2023-06-28 ENCOUNTER — Encounter (HOSPITAL_COMMUNITY): Payer: Self-pay

## 2023-06-30 ENCOUNTER — Other Ambulatory Visit (HOSPITAL_BASED_OUTPATIENT_CLINIC_OR_DEPARTMENT_OTHER): Payer: Self-pay | Admitting: *Deleted

## 2023-06-30 DIAGNOSIS — E039 Hypothyroidism, unspecified: Secondary | ICD-10-CM

## 2023-07-01 LAB — TSH: TSH: 3.19 u[IU]/mL (ref 0.450–4.500)

## 2023-07-05 ENCOUNTER — Encounter: Payer: Self-pay | Admitting: Speech Pathology

## 2023-07-05 ENCOUNTER — Other Ambulatory Visit (HOSPITAL_COMMUNITY): Payer: Self-pay | Admitting: Internal Medicine

## 2023-07-05 ENCOUNTER — Other Ambulatory Visit: Payer: Self-pay

## 2023-07-05 ENCOUNTER — Ambulatory Visit: Attending: Internal Medicine | Admitting: Speech Pathology

## 2023-07-05 DIAGNOSIS — R1310 Dysphagia, unspecified: Secondary | ICD-10-CM | POA: Insufficient documentation

## 2023-07-05 DIAGNOSIS — R1312 Dysphagia, oropharyngeal phase: Secondary | ICD-10-CM | POA: Insufficient documentation

## 2023-07-05 DIAGNOSIS — R059 Cough, unspecified: Secondary | ICD-10-CM

## 2023-07-05 NOTE — Patient Instructions (Addendum)
 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   SWALLOWING EXERCISES Effortful Swallows - Squeeze hard with the muscles in your neck while you swallow your  saliva or a sip of water - Repeat 20 times, 2-3 times a day, and use whenever you eat or drink  [place]ako Swallow - swallow with your tongue sticking out - Stick tongue out and gently bite tongue with your teeth - Swallow, while holding your tongue with your teeth - Repeat 20 times, 2-3 times a day   Shaker Exercise - head lift - Lie flat on your back in your bed or on a couch without pillows - Raise your head and look at your feet  - KEEP YOUR SHOULDERS DOWN - HOLD FOR 45 to 60 SECONDS, then lower your head back down - Repeat 3 times, 2-3 times a day   [employer] -  swallow as tight as you  for 5 seconds - Start to swallow, and keep your Adam's apple up by squeezing tight with the muscles of the throat - Hold the squeeze for 5-7 seconds and then relax - Repeat 20 times, 2-3 times a day - You tube will have some examples   Tongue Press - Press your entire tongue as hard as you can against the roof of your mouth for 3-5 seconds - Repeat 20 times, 2-3 times a day        6. CTAR - Chin Tuck Against Resistance              - Place towel, ball or pool noodle under your chin             - Hold for 60 seconds 2-3x  a day             - Pulse up and down 20x 2-3x a day  Modified Barium Swallow Study - Tuesday June 10 - 10:45 at [facility]  Use the main entrance, entrance A off of [address] - ask for radiology department

## 2023-07-05 NOTE — Therapy (Signed)
 OUTPATIENT SPEECH LANGUAGE PATHOLOGY SWALLOW EVALUATION   Patient Name: [name] Wu MRN: [id number] DOB:[birth year], 68 y.o., male Today's Date: 07/05/2023  PCP: [place], [name] Jansky, MD REFERRING PROVIDER: Ledon Pry, MD  END OF SESSION:  End of Session - 07/05/23 1212     Visit Number 1    Number of Visits 9    Date for SLP Re-Evaluation 08/30/23   extended due to scheduling   Authorization Type [facility]    Authorization Time Period 05/14/23 to 11/13/22    Authorization - Visit Number 1    Authorization - Number of Visits 15    SLP Start Time 1016    SLP Stop Time  1059    SLP Time Calculation (min) 43 min    Activity Tolerance Patient tolerated treatment well             Past Medical History:  Diagnosis Date   Amnesia    after being hitting head playing baseball, lasted 1 night- global unsure of date   Esophageal stenosis    History of radiation therapy 10/27/13- 12/23/13   R oropharynx 7000 cGy, 35 fx, R neck adenopathy 7000 cGy 35 fx, high risk nodal bed 5950 cGy 35 fx, low risk nodal bed 5600 cGy 35 fx   History of stomach ulcers ~ 1983   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    High risk nodal bed 5950 cGy in 35 sessions, and low risk nodal bed 5600 cGy in 35 sessions                      Sinusitis 02/03/2014   Sleep apnea    uses CPAP   Thyroid  disease    hypothyroid   Tuberculosis 1984   S/P military stay in [place]; INH x 26yrs; "cleared"   Past Surgical History:  Procedure Laterality Date   APPENDECTOMY  ~ 1967   at [facility]   CHEST WALL TUMOR EXCISION Left 1977   DIRECT LARYNGOSCOPY N/A 09/12/2013   Procedure: DIRECT LARYNGOSCOPY WITH BIOPSY ;  Surgeon:  [name] Mellow, MD;  Location: [facility] OR;  Service: ENT;  Laterality: N/A;   DIRECT LARYNGOSCOPY N/A 01/12/2015   Procedure: DIRECT LARYNGOSCOPY;  Surgeon: [name] Mellow, MD;  Location: [facility];  Service: ENT;  Laterality: N/A;   ESOPHAGOSCOPY N/A 09/12/2013   Procedure: ESOPHAGOSCOPY;  Surgeon: [name] Mellow, MD;  Location: [facility] OR;  Service: ENT;  Laterality: N/A;   ESOPHAGOSCOPY WITH DILITATION N/A 01/12/2015   Procedure: ESOPHAGOSCOPY WITH DILITATION;  Surgeon: [name] Mellow, MD;  Location: [facility];  Service: ENT;  Laterality: N/A;   FOOT FRACTURE SURGERY Left ~ 1995   GASTROJEJUNOSTOMY N/A 10/17/2013   Procedure: LAPAROSCOPIC GASTROJEJUNOSTOMY;  Surgeon: [name] Rima, MD;  Location: [facility] OR;  Service: General;  Laterality: N/A;  Dr. Vann Wu card was used please make a preference card for Dr.  [name].   GASTROSTOMY TUBE PLACEMENT Left 10/17/2013   HEEL SPUR SURGERY Left ~ 1995   "shaved bone down; took nerve out"   INTERPHALANGEAL JOINT ARTHROPLASTY Right ` 2008   "middle"   PORTACATH PLACEMENT Left 10/17/2013   power port   PORTACATH PLACEMENT Left 10/17/2013   Procedure: INSERTION PORT-A-CATH;  Surgeon: [name] Rima, MD;  Location: [facility] OR;  Service: General;  Laterality: Left;   TONGUE BIOPSY  09/2013   "@ base of tongue"   UPPER GASTROINTESTINAL ENDOSCOPY     VASECTOMY  1982   Patient Active Problem List   Diagnosis Date Noted   Actinic keratosis 06/06/2023   Exposure to potentially hazardous substance 06/06/2023   Finger injury 06/06/2023   Hearing loss 06/06/2023   Prediabetes 10/26/2022   Family history of Alzheimer's disease 10/26/2022   Difficulty swallowing pills 07/29/2022   Elevated blood pressure reading in office without diagnosis of hypertension 07/29/2022   OSA on CPAP 07/29/2022   Peripheral neuropathy due to chemotherapy (HCC) 07/29/2022   Pain in finger of left hand 07/09/2019   History of head and neck radiation 05/04/2017   History of cancer  tonsil 05/29/2015   Xerostomia 07/24/2014   Hypothyroidism (acquired) 07/24/2014   Other fatigue 04/23/2014   Sinusitis 02/03/2014   Secondary lymphedema 02/03/2014   Mucositis due to antineoplastic therapy 01/06/2014   Anemia in neoplastic disease 12/09/2013   Exposed mandible 11/18/2013   Nausea without vomiting 11/15/2013   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 He received 37 rounds of XRT and 3 chemo.   PAIN:  Are you having pain? No  FALLS: Has patient fallen in last 6 months?  No  LIVING ENVIRONMENT: Lives with: lives with their spouse Lives in: House/apartment  PLOF:  Level of assistance: Independent with ADLs, Independent with IADLs Employment: Full-time employment  PATIENT GOALS: "I want to swallow better"  OBJECTIVE:  Note: Objective measures were completed at Evaluation unless otherwise noted. OBJECTIVE:   DIAGNOSTIC FINDINGS: IMPRESSION Esophagram 2016:  1. Lower cervical esophageal web does somewhat narrow the lumen of the lower cervical esophagus. 2. Moderate gastroesophageal reflux. The patient could not swallow the pill at the end of the study.    COGNITION: Overall cognitive status: Within functional limits for tasks assessed Areas of impairment:     SUBJECTIVE DYSPHAGIA REPORTS:  Date of onset: 10 years ago Reported symptoms:  coughing with liquids, choking with both solids and liquids, globus sensation, xerostomia, regurgitation, and hoarseness  Current diet: Dysphagia 3 (mechanical soft), Dysphagia 2 (chopped/minced), and thin liquids  Co-morbid voice changes: Yes  FACTORS WHICH MAY INCREASE RISK OF ADVERSE EVENT IN PRESENCE OF ASPIRATION:  General health: well appearing  Risk factors: none evident     ORAL MOTOR EXAMINATION: Overall status: WFL Comments:   CLINICAL SWALLOW ASSESSMENT:   Dentition: adequate natural dentition Vocal quality at baseline: normal Patient directly observed with  POs: Yes: dysphagia 3 (soft) and thin liquids  Feeding: able to feed self Liquids provided by: cup Yale Swallow Protocol: immediate throat clears -  Oral phase signs and symptoms: WNL - reports dry mouth Pharyngeal phase signs and symptoms: multiple swallows, immediate throat clear, complaints of residue, complaints of globus, and voice change to raspy  PATIENT REPORTED OUTCOME MEASURES (PROM): EAT-10: 20 - rated 4, or severe problem for swallowing is stressful; rated 3 for difficulty eating out, swallowing takes extra effort, pleasure of eating is affected by swallowing difficulty                                                                                                                             TREATMENT DATE:   Consider EMST pending results of MBSS  07/05/23: Eval completed - initiated training in HEP for dysphagia and to lessen late effects of XRT. Effortful swallow completed with occasional min A10/10x; Masako with usual mod A 2/8x; CTAR hold for 45 sec and 20 reps with occasional min A; Mendelson 0/7 attempts with max tactile and visual A; modified Shakers demonstrated due to time. Educated re: swallowing with intent which he reports he does consistently.     PATIENT EDUCATION: Education details: HEP for dysphagia; general swallow precautions, recommend MBSS - See Treatment, See patient  instructions Person educated: Patient Education method: Explanation, Demonstration, Verbal cues, and Handouts Education comprehension: verbalized understanding, returned demonstration, verbal cues required, and needs further education   ASSESSMENT:  CLINICAL IMPRESSION: Patient is a 68 y.o. male who was seen today for oropharyngeal dysphagia. He reports globus, regurgitation. Unable to tolerate meats, breads, cakes. Has to take synthroid  with Pepsi only to facilitate passage of pill. Difficulty eating out - usually orders baked potato only. Weight is stable, denies h/o pna. Does have h/o of cervical esophageal web per esophagram 2016, as well as moderate GERD. He is currently not taking anti-reflux meds.  He takes 2 Ensure a day for weight maintenance. MBSS rescheduled for Tuesday July 18, 2023. I recommend skilled ST to maximize safety of swallow to reduce risk of aspiration pna, weight loss and choking. Consider referral of laryngologist pending results of MBSS - will need [facility] auth for this.   OBJECTIVE IMPAIRMENTS: include dysphagia. These impairments are limiting patient from safety when swallowing. Factors affecting potential to achieve goals and functional outcome are previous level of function and severity of impairments. Patient will benefit from skilled SLP services to address above impairments and improve overall function.  REHAB POTENTIAL: Good   GOALS: Goals reviewed with patient? Yes   LONG TERM GOALS: Target date: 08/30/23  Pt will complete HEP for dysphagia with mod I Baseline:  Goal status: INITIAL  2.  Pt will verbalize need to continue HEP 5/7 days a week after d/c from ST Baseline:  Goal status: INITIAL  3.  Pt will follow swallow precautions with mod I Baseline:  Goal status: INITIAL  4.  Pt will follow  diet modifications with mod I Baseline:  Goal status: INITIAL  5.  Pt will verbalize 3 reflux precautions with mod I Baseline:  Goal status:  INITIAL  PLAN:  SLP FREQUENCY: 2x/week  SLP DURATION: 4 weeks  PLANNED INTERVENTIONS: Aspiration precaution training, Pharyngeal strengthening exercises, Diet toleration management , Environmental controls, Trials of upgraded texture/liquids, Cueing hierachy, Internal/external aids, SLP instruction and feedback, Compensatory strategies, Patient/family education, [phone] Treatment of swallowing function, and MBSS    Asani Deniston, Dareen Ebbing, CCC-SLP 07/05/2023, 12:25 PM

## 2023-07-07 ENCOUNTER — Ambulatory Visit (HOSPITAL_BASED_OUTPATIENT_CLINIC_OR_DEPARTMENT_OTHER): Payer: Self-pay | Admitting: Family Medicine

## 2023-07-07 ENCOUNTER — Other Ambulatory Visit (HOSPITAL_BASED_OUTPATIENT_CLINIC_OR_DEPARTMENT_OTHER): Payer: Self-pay | Admitting: Family Medicine

## 2023-07-07 ENCOUNTER — Ambulatory Visit: Admitting: Speech Pathology

## 2023-07-07 DIAGNOSIS — R1310 Dysphagia, unspecified: Secondary | ICD-10-CM

## 2023-07-07 DIAGNOSIS — R1312 Dysphagia, oropharyngeal phase: Secondary | ICD-10-CM | POA: Diagnosis not present

## 2023-07-07 DIAGNOSIS — Z0000 Encounter for general adult medical examination without abnormal findings: Secondary | ICD-10-CM

## 2023-07-07 NOTE — Therapy (Signed)
 OUTPATIENT SPEECH LANGUAGE PATHOLOGY REATMENT NOTE   Patient Name: [name] Wu MRN: [id number] DOB:07-10-1955, 68 y.o., male Today's Date: 07/07/2023  PCP: Eddie Good [place], Alonza Jansky, MD REFERRING PROVIDER: Ledon Pry, MD  END OF SESSION:  End of Session - 07/07/23 1102     Visit Number 2    Number of Visits 9    Date for SLP Re-Evaluation 08/30/23   extended due to scheduling   Authorization Type [facility]    Authorization Time Period 05/14/23 to 11/13/22    Authorization - Visit Number 2    Authorization - Number of Visits 15    SLP Start Time 1102    SLP Stop Time  1145    SLP Time Calculation (min) 43 min    Activity Tolerance Patient tolerated treatment well             Past Medical History:  Diagnosis Date   Amnesia    after being hitting head playing baseball, lasted 1 night- global unsure of date   Esophageal stenosis    History of radiation therapy 10/27/13- 12/23/13   R oropharynx 7000 cGy, 35 fx, R neck adenopathy 7000 cGy 35 fx, high risk nodal bed 5950 cGy 35 fx, low risk nodal bed 5600 cGy 35 fx   History of stomach ulcers ~ 1983   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    High risk nodal bed 5950 cGy in 35 sessions, and low risk nodal bed 5600 cGy in 35 sessions                      Sinusitis 02/03/2014   Sleep apnea    uses CPAP   Thyroid  disease    hypothyroid   Tuberculosis 1984   S/P military stay in [place]; INH x 75yrs; "cleared"   Past Surgical History:  Procedure Laterality Date   APPENDECTOMY  ~ 1967   at [facility]   CHEST WALL TUMOR EXCISION Left 1977   DIRECT LARYNGOSCOPY N/A 09/12/2013   Procedure: DIRECT LARYNGOSCOPY WITH BIOPSY ;  Surgeon:  Janita Mellow, MD;  Location: [facility] OR;  Service: ENT;  Laterality: N/A;   DIRECT LARYNGOSCOPY N/A 01/12/2015   Procedure: DIRECT LARYNGOSCOPY;  Surgeon: Janita Mellow, MD;  Location: [facility];  Service: ENT;  Laterality: N/A;   ESOPHAGOSCOPY N/A 09/12/2013   Procedure: ESOPHAGOSCOPY;  Surgeon: Janita Mellow, MD;  Location: [facility] OR;  Service: ENT;  Laterality: N/A;   ESOPHAGOSCOPY WITH DILITATION N/A 01/12/2015   Procedure: ESOPHAGOSCOPY WITH DILITATION;  Surgeon: Janita Mellow, MD;  Location: [facility];  Service: ENT;  Laterality: N/A;   FOOT FRACTURE SURGERY Left ~ 1995   GASTROJEJUNOSTOMY N/A 10/17/2013   Procedure: LAPAROSCOPIC GASTROJEJUNOSTOMY;  Surgeon: Lockie Rima, MD;  Location: [facility] OR;  Service: General;  Laterality: N/A;  Dr. Vann Gent card was used please make a preference card for Dr.  [name].   GASTROSTOMY TUBE PLACEMENT Left 10/17/2013   HEEL SPUR SURGERY Left ~ 1995   "shaved bone down; took nerve out"   INTERPHALANGEAL JOINT ARTHROPLASTY Right ` 2008   "middle"   PORTACATH PLACEMENT Left 10/17/2013   power port   PORTACATH PLACEMENT Left 10/17/2013   Procedure: INSERTION PORT-A-CATH;  Surgeon: Lockie Rima, MD;  Location: [facility] OR;  Service: General;  Laterality: Left;   TONGUE BIOPSY  09/2013   "@ base of tongue"   UPPER GASTROINTESTINAL ENDOSCOPY     VASECTOMY  1982   Patient Active Problem List   Diagnosis Date Noted   Actinic keratosis 06/06/2023   Exposure to potentially hazardous substance 06/06/2023   Finger injury 06/06/2023   Hearing loss 06/06/2023   Prediabetes 10/26/2022   Family history of Alzheimer's disease 10/26/2022   Difficulty swallowing pills 07/29/2022   Elevated blood pressure reading in office without diagnosis of hypertension 07/29/2022   OSA on CPAP 07/29/2022   Peripheral neuropathy due to chemotherapy (HCC) 07/29/2022   Pain in finger of left hand 07/09/2019   History of head and neck radiation 05/04/2017   History of cancer  tonsil 05/29/2015   Xerostomia 07/24/2014   Hypothyroidism (acquired) 07/24/2014   Other fatigue 04/23/2014   Sinusitis 02/03/2014   Secondary lymphedema 02/03/2014   Mucositis due to antineoplastic therapy 01/06/2014   Anemia in neoplastic disease 12/09/2013   Exposed mandible 11/18/2013   Nausea without vomiting 11/15/2013   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 He received 37 rounds of XRT and 3 chemo.   PAIN:  Are you having pain? No  FALLS: Has patient fallen in last 6 months?  No  LIVING ENVIRONMENT: Lives with: lives with their spouse Lives in: House/apartment  PLOF:  Level of assistance: Independent with ADLs, Independent with IADLs Employment: Full-time employment  PATIENT GOALS: "I want to swallow better"  OBJECTIVE:  Note: Objective measures were completed at Evaluation unless otherwise noted. OBJECTIVE:   DIAGNOSTIC FINDINGS: IMPRESSION Esophagram 2016:  1. Lower cervical esophageal web does somewhat narrow the lumen of the lower cervical esophagus. 2. Moderate gastroesophageal reflux. The patient could not swallow the pill at the end of the study.    COGNITION: Overall cognitive status: Within functional limits for tasks assessed Areas of impairment:     SUBJECTIVE DYSPHAGIA REPORTS:  Date of onset: 10 years ago Reported symptoms: coughing with liquids,  choking with both solids and liquids, globus sensation, xerostomia, regurgitation, and hoarseness  Current diet: Dysphagia 3 (mechanical soft), Dysphagia 2 (chopped/minced), and thin liquids  Co-morbid voice changes: Yes  FACTORS WHICH MAY INCREASE RISK OF ADVERSE EVENT IN PRESENCE OF ASPIRATION:  General health: well appearing  Risk factors: none evident     ORAL MOTOR EXAMINATION: Overall status: WFL Comments:   CLINICAL SWALLOW ASSESSMENT:   Dentition: adequate natural dentition Vocal quality at baseline: normal Patient directly observed with POs: Yes: dysphagia 3 (soft) and thin liquids  Feeding: able to feed self Liquids provided by: cup Yale Swallow Protocol: immediate throat clears -  Oral phase signs and symptoms: WNL - reports dry mouth Pharyngeal phase signs and symptoms: multiple swallows, immediate throat clear, complaints of residue, complaints of globus, and voice change to raspy  PATIENT REPORTED OUTCOME MEASURES (PROM): EAT-10: 20 - rated 4, or severe problem for swallowing is stressful; rated 3 for difficulty eating out, swallowing takes extra effort, pleasure of eating is affected by swallowing difficulty                                                                                                                             TREATMENT DATE:   Consider EMST pending results of MBSS  07/07/23: Led pt through dysphagia exercises with pt completing the following: Effortful swallow: 30 reps in sets of 3 (1 small thin liquid sip + 2 dray swallows) Masako: x5. Pt reporting challenges eliciting swallow while keeping tongue anchored  Mendelsohn: pt able to achieve x3 using tactile cues and verbal cues to maintain strong lingual elevation CTAR: pt demonstrating exercise but is not able to sense any targeted musculature working. Had pt complete shaker exercise alternative and appears pt is completing correctly but again has reduced sensation.   Discussed importance of  completing HEP BID consistency d/t pervasive nature of post radiation dysphagia. Pt reports challenges d/t being very busy but will try to implement routine.   07/05/23: Eval completed - initiated training in HEP for dysphagia and to lessen late effects of XRT. Effortful swallow completed with occasional min A10/10x; Masako with usual mod A 2/8x; CTAR hold for 45 sec and 20 reps with occasional min A; Mendelson 0/7 attempts with max tactile and visual A; modified Shakers demonstrated due to time. Educated re: swallowing with intent which he reports he does consistently.     PATIENT EDUCATION: Education details: HEP for dysphagia; general swallow precautions, recommend MBSS - See Treatment, See patient instructions Person educated: Patient Education method: Explanation, Demonstration, Verbal cues, and Handouts Education comprehension: verbalized understanding, returned demonstration, verbal cues required, and needs further education   ASSESSMENT:  CLINICAL IMPRESSION: Patient is a 68 y.o. male who was seen today for oropharyngeal dysphagia. He reports globus, regurgitation. Unable to tolerate meats, breads, cakes. Has to take synthroid  with Pepsi only to facilitate passage of pill. Difficulty eating out - usually orders baked potato only. Weight is stable, denies h/o pna. Does have h/o of cervical esophageal web per esophagram 2016, as well as moderate GERD. He is currently not taking anti-reflux meds.  He takes 2 Ensure a day for weight maintenance. MBSS rescheduled for Tuesday July 18, 2023. I recommend skilled ST to maximize safety of swallow to reduce risk of aspiration pna, weight loss and choking. Consider referral of laryngologist pending results of MBSS - will need [facility] auth for this.   OBJECTIVE IMPAIRMENTS: include dysphagia. These impairments are limiting patient from safety when swallowing. Factors affecting potential  to achieve goals and functional outcome are previous level of function  and severity of impairments. Patient will benefit from skilled SLP services to address above impairments and improve overall function.  REHAB POTENTIAL: Good   GOALS: Goals reviewed with patient? Yes   LONG TERM GOALS: Target date: 08/30/23  Pt will complete HEP for dysphagia with mod I Baseline:  Goal status: INITIAL  2.  Pt will verbalize need to continue HEP 5/7 days a week after d/c from ST Baseline:  Goal status: INITIAL  3.  Pt will follow swallow precautions with mod I Baseline:  Goal status: INITIAL  4.  Pt will follow diet modifications with mod I Baseline:  Goal status: INITIAL  5.  Pt will verbalize 3 reflux precautions with mod I Baseline:  Goal status: INITIAL  PLAN:  SLP FREQUENCY: 2x/week  SLP DURATION: 4 weeks  PLANNED INTERVENTIONS: Aspiration precaution training, Pharyngeal strengthening exercises, Diet toleration management , Environmental controls, Trials of upgraded texture/liquids, Cueing hierachy, Internal/external aids, SLP instruction and feedback, Compensatory strategies, Patient/family education, [phone] Treatment of swallowing function, and MBSS    Alston Jerry, CCC-SLP 07/07/2023, 11:03 AM

## 2023-07-12 ENCOUNTER — Encounter: Payer: Self-pay | Admitting: Speech Pathology

## 2023-07-12 ENCOUNTER — Ambulatory Visit: Attending: Internal Medicine | Admitting: Speech Pathology

## 2023-07-12 DIAGNOSIS — R1312 Dysphagia, oropharyngeal phase: Secondary | ICD-10-CM | POA: Diagnosis present

## 2023-07-12 NOTE — Therapy (Signed)
 OUTPATIENT SPEECH LANGUAGE PATHOLOGY REATMENT NOTE   Patient Name: [name] Wu MRN: [id number] DOB:1955/03/11, 68 y.o., male Today's Date: 07/12/2023  PCP: Eddie Good Peru, Alonza Jansky, MD REFERRING PROVIDER: Ledon Pry, MD  END OF SESSION:  End of Session - 07/12/23 1235     Visit Number 3    Number of Visits 9    Date for SLP Re-Evaluation 08/30/23    Authorization Type [facility]    Authorization Time Period 05/14/23 to 11/13/22    Authorization - Visit Number 3    Authorization - Number of Visits 15    SLP Start Time 1235    SLP Stop Time  1310    SLP Time Calculation (min) 35 min    Activity Tolerance Patient tolerated treatment well             Past Medical History:  Diagnosis Date   Amnesia    after being hitting head playing baseball, lasted 1 night- global unsure of date   Esophageal stenosis    History of radiation therapy 10/27/13- 12/23/13   R oropharynx 7000 cGy, 35 fx, R neck adenopathy 7000 cGy 35 fx, high risk nodal bed 5950 cGy 35 fx, low risk nodal bed 5600 cGy 35 fx   History of stomach ulcers ~ 1983   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    High risk nodal bed 5950 cGy in 35 sessions, and low risk nodal bed 5600 cGy in 35 sessions                      Sinusitis 02/03/2014   Sleep apnea    uses CPAP   Thyroid  disease    hypothyroid   Tuberculosis 1984   S/P military stay in [place]; INH x 48yrs; "cleared"   Past Surgical History:  Procedure Laterality Date   APPENDECTOMY  ~ 1967   at [facility]   CHEST WALL TUMOR EXCISION Left 1977   DIRECT LARYNGOSCOPY N/A 09/12/2013   Procedure: DIRECT LARYNGOSCOPY WITH BIOPSY ;  Surgeon: Janita Mellow, MD;  Location: [facility]  OR;  Service: ENT;  Laterality: N/A;   DIRECT LARYNGOSCOPY N/A 01/12/2015   Procedure: DIRECT LARYNGOSCOPY;  Surgeon: Janita Mellow, MD;  Location: [facility];  Service: ENT;  Laterality: N/A;   ESOPHAGOSCOPY N/A 09/12/2013   Procedure: ESOPHAGOSCOPY;  Surgeon: Janita Mellow, MD;  Location: [facility] OR;  Service: ENT;  Laterality: N/A;   ESOPHAGOSCOPY WITH DILITATION N/A 01/12/2015   Procedure: ESOPHAGOSCOPY WITH DILITATION;  Surgeon: Janita Mellow, MD;  Location: [facility];  Service: ENT;  Laterality: N/A;   FOOT FRACTURE SURGERY Left ~ 1995   GASTROJEJUNOSTOMY N/A 10/17/2013   Procedure: LAPAROSCOPIC GASTROJEJUNOSTOMY;  Surgeon: Lockie Rima, MD;  Location: [facility] OR;  Service: General;  Laterality: N/A;  Dr. Vann Gent card was used please make a preference card for Dr. Cherlynn Cornfield.   GASTROSTOMY TUBE  PLACEMENT Left 10/17/2013   HEEL SPUR SURGERY Left ~ 1995   "shaved bone down; took nerve out"   INTERPHALANGEAL JOINT ARTHROPLASTY Right ` 2008   "middle"   PORTACATH PLACEMENT Left 10/17/2013   power port   PORTACATH PLACEMENT Left 10/17/2013   Procedure: INSERTION PORT-A-CATH;  Surgeon: Lockie Rima, MD;  Location: [facility] OR;  Service: General;  Laterality: Left;   TONGUE BIOPSY  09/2013   "@ base of tongue"   UPPER GASTROINTESTINAL ENDOSCOPY     VASECTOMY  1982   Patient Active Problem List   Diagnosis Date Noted   Actinic keratosis 06/06/2023   Exposure to potentially hazardous substance 06/06/2023   Finger injury 06/06/2023   Hearing loss 06/06/2023   Prediabetes 10/26/2022   Family history of Alzheimer's disease 10/26/2022   Difficulty swallowing pills 07/29/2022   Elevated blood pressure reading in office without diagnosis of hypertension 07/29/2022   OSA on CPAP 07/29/2022   Peripheral neuropathy due to chemotherapy (HCC) 07/29/2022   Pain in finger of left hand 07/09/2019   History of head and neck radiation 05/04/2017   History of cancer tonsil 05/29/2015   Xerostomia  07/24/2014   Hypothyroidism (acquired) 07/24/2014   Other fatigue 04/23/2014   Sinusitis 02/03/2014   Secondary lymphedema 02/03/2014   Mucositis due to antineoplastic therapy 01/06/2014   Anemia in neoplastic disease 12/09/2013   Exposed mandible 11/18/2013   Nausea without vomiting 11/15/2013   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 He received 37 rounds of XRT and 3 chemo.   PAIN:  Are you having pain? No  FALLS: Has patient fallen in last 6 months?  No  LIVING ENVIRONMENT: Lives with: lives with their spouse Lives in: House/apartment  PLOF:  Level of assistance: Independent with ADLs, Independent with IADLs Employment: Full-time employment  PATIENT GOALS: "I want to swallow better"  OBJECTIVE:  Note: Objective measures were completed at Evaluation unless otherwise noted. OBJECTIVE:   DIAGNOSTIC FINDINGS: IMPRESSION Esophagram 2016:  1. Lower cervical esophageal web does somewhat narrow the lumen of the lower cervical esophagus. 2. Moderate gastroesophageal reflux. The patient could not swallow the pill at the end of the study.    COGNITION: Overall cognitive status: Within functional limits for tasks assessed Areas of impairment:     SUBJECTIVE DYSPHAGIA REPORTS:  Date of onset: 10 years ago Reported symptoms: coughing with liquids, choking with both solids and liquids, globus  sensation, xerostomia, regurgitation, and hoarseness  Current diet: Dysphagia 3 (mechanical soft), Dysphagia 2 (chopped/minced), and thin liquids  Co-morbid voice changes: Yes  FACTORS WHICH MAY INCREASE RISK OF ADVERSE EVENT IN PRESENCE OF ASPIRATION:  General health: well appearing  Risk factors: none evident     ORAL MOTOR EXAMINATION: Overall status: WFL Comments:   CLINICAL SWALLOW ASSESSMENT:   Dentition: adequate natural dentition Vocal quality at baseline: normal Patient directly observed with POs: Yes: dysphagia 3 (soft) and thin liquids  Feeding: able to feed self Liquids  provided by: cup [facility] Swallow Protocol: immediate throat clears -  Oral phase signs and symptoms: WNL - reports dry mouth Pharyngeal phase signs and symptoms: multiple swallows, immediate throat clear, complaints of residue, complaints of globus, and voice change to raspy  PATIENT REPORTED OUTCOME MEASURES (PROM): EAT-10: 20 - rated 4, or severe problem for swallowing is stressful; rated 3 for difficulty eating out, swallowing takes extra effort, pleasure of eating is affected by swallowing difficulty                                                                                                                             TREATMENT DATE:   Consider EMST pending results of MBSS  07/12/23: Mealey has been completing HEP as able at home - he continues to struggle with dry mouth due to missing saliva gland. Effortful swallow - 1 sip and 2 dry total of 30 reps. Masako required liquid sips every 2 reps and occasional verbal cues to keep tongue between teeth. CTAR with rare min A for 45 second holds and 2 sets of 20 reps of nods. Mendelson required frequent max A to achieve 2/10 attempts. Reviewed general swallow precautions and educated pt on what to expect during MBSS next week.   07/07/23: Led pt through dysphagia exercises with pt completing the following: Effortful swallow: 30 reps in sets of 3 (1 small thin  liquid sip + 2 dray swallows) Masako: x5. Pt reporting challenges eliciting swallow while keeping tongue anchored  Mendelsohn: pt able to achieve x3 using tactile cues and verbal cues to maintain strong lingual elevation CTAR: pt demonstrating exercise but is not able to sense any targeted musculature working. Had pt complete shaker exercise alternative and appears pt is completing correctly but again has reduced sensation.   Discussed importance of completing HEP BID consistency d/t pervasive nature of post radiation dysphagia. Pt reports challenges d/t being very busy but will try to implement routine.   07/05/23: Eval completed - initiated training in HEP for dysphagia and to lessen late effects of XRT. Effortful swallow completed with occasional min A10/10x; Masako with usual mod A 2/8x; CTAR hold for 45 sec and 20 reps with occasional min A; Mendelson 0/7 attempts with max tactile and visual A; modified Shakers demonstrated due to time. Educated re: swallowing with intent which he reports he does consistently.     PATIENT EDUCATION: Education details: HEP for dysphagia; general swallow precautions, recommend MBSS - See Treatment, See patient instructions Person educated: Patient Education method: Explanation, Demonstration, Verbal cues, and Handouts Education comprehension: verbalized understanding, returned demonstration, verbal cues required, and needs further education   ASSESSMENT:  CLINICAL IMPRESSION: Patient is a 68 y.o. male who was seen today for oropharyngeal dysphagia. He reports globus, regurgitation. Unable to tolerate meats, breads, cakes. Has to take synthroid  with Pepsi only to facilitate passage of pill. Difficulty eating out - usually orders baked potato only. Weight is stable, denies h/o pna. Does have h/o  of cervical esophageal web per esophagram 2016, as well as moderate GERD. He is currently not taking anti-reflux meds.  He takes 2 Ensure a day for weight maintenance.  MBSS rescheduled for Tuesday July 18, 2023. I recommend skilled ST to maximize safety of swallow to reduce risk of aspiration pna, weight loss and choking. Consider referral of laryngologist pending results of MBSS - will need [facility] auth for this.   OBJECTIVE IMPAIRMENTS: include dysphagia. These impairments are limiting patient from safety when swallowing. Factors affecting potential to achieve goals and functional outcome are previous level of function and severity of impairments. Patient will benefit from skilled SLP services to address above impairments and improve overall function.  REHAB POTENTIAL: Good   GOALS: Goals reviewed with patient? Yes   LONG TERM GOALS: Target date: 08/30/23  Pt will complete HEP for dysphagia with mod I Baseline:  Goal status: INITIAL  2.  Pt will verbalize need to continue HEP 5/7 days a week after d/c from ST Baseline:  Goal status: INITIAL  3.  Pt will follow swallow precautions with mod I Baseline:  Goal status: INITIAL  4.  Pt will follow diet modifications with mod I Baseline:  Goal status: INITIAL  5.  Pt will verbalize 3 reflux precautions with mod I Baseline:  Goal status: INITIAL  PLAN:  SLP FREQUENCY: 2x/week  SLP DURATION: 4 weeks  PLANNED INTERVENTIONS: Aspiration precaution training, Pharyngeal strengthening exercises, Diet toleration management , Environmental controls, Trials of upgraded texture/liquids, Cueing hierachy, Internal/external aids, SLP instruction and feedback, Compensatory strategies, Patient/family education, [phone] Treatment of swallowing function, and MBSS    Salena Ortlieb, Dareen Ebbing, CCC-SLP 07/12/2023, 1:11 PM

## 2023-07-12 NOTE — Patient Instructions (Signed)
   Keep up the exercises and add the stretches 2x day  Make sure you are chewing all foods very well to a puree consistency  Hot dogs and grapes are the things people choke on the most   The swallow test will test hard solids (graham cracker), fruit cocktail, larger pill, and thin and thick liquids and a straw  You can eat before this test  Next therapy session is Thursday after your swallow test

## 2023-07-18 ENCOUNTER — Ambulatory Visit (HOSPITAL_COMMUNITY)
Admission: RE | Admit: 2023-07-18 | Discharge: 2023-07-18 | Disposition: A | Discharge status: Home / Self Care | Source: Ambulatory Visit | Attending: Family Medicine | Admitting: Family Medicine

## 2023-07-18 DIAGNOSIS — R059 Cough, unspecified: Secondary | ICD-10-CM

## 2023-07-18 DIAGNOSIS — Z85810 Personal history of malignant neoplasm of tongue: Secondary | ICD-10-CM | POA: Diagnosis not present

## 2023-07-18 DIAGNOSIS — R1313 Dysphagia, pharyngeal phase: Secondary | ICD-10-CM | POA: Insufficient documentation

## 2023-07-18 DIAGNOSIS — R1310 Dysphagia, unspecified: Secondary | ICD-10-CM | POA: Diagnosis present

## 2023-07-18 DIAGNOSIS — G4733 Obstructive sleep apnea (adult) (pediatric): Secondary | ICD-10-CM | POA: Insufficient documentation

## 2023-07-18 NOTE — Evaluation (Signed)
 Modified Barium Swallow Study  Patient Details  Name: [name] Wu MRN: [id number] Date of Birth: 02-Sep-1955  Today's Date: 07/18/2023  HPI/PMH: HPI: [name] is a 68 yo M with PMHx of fatigue, sinusitis, TB (1984), esophageal stenosis, OSA on SPAP, and Malignant neoplasm of the base of tongue s/p XRT (2015) who presents with chronic difficulty swallowing, characterized by coughing/choking with both liquids and solids leading often to expectoration/vomiting of unswallowed material. Patient endorses that this swallowing difficulty has been present for numerous years following XRT for head and neck cancer, and recently has started working with OTPT SLP to improve swallowing function.   Clinical Impression: Clinical Impression: Patient presents with mild pharyngeal swallowing impairment characterized primarily by impaired pharyngeal efficiency status post remote XRT for base of tongue cancer in 2015. As often seen with HNC, patient is most likely exhibiting sequelae of radiation to the pharyngeal area including tissue fibrosis, which impairs movement of the pharyngeal swallowing structures. As a result, patient demonstrates reduced hyolaryngeal excursion/elevation, epiglottic inversion, and pharyngeal constriction resulting in collection of residue primarily in the vallecula which increases in amount alongside bolus viscosity. Effortful swallows, liquid wash, and multiple swallows are somewhat effective in mitigating residue, though no strategy fully clears the vallecula and patient was observed to expectorate residue intermittently. Patient independently compensates further by taking small bites and chewing thoroughly. Recommend continuation of preferred items, utilizing all above mentioned strategies. Recommend cutting small medications in half and taking them with purees/pudding to ease pharyngeal transit. Continue with OTPT SLP.  Factors that may increase risk of adverse event in presence  of aspiration [name] Wu & [name] Wu 2021): No data recorded  Recommendations/Plan: Swallowing Evaluation Recommendations Swallowing Evaluation Recommendations Recommendations: PO diet PO Diet Recommendation: Regular; Thin liquids (Level 0) Liquid Administration via: Cup; Straw Medication Administration: Whole meds with puree Supervision: Patient able to self-feed Swallowing strategies  : Slow rate; Small bites/sips; effortful swallow; Multiple dry swallows after each bite/sip; Follow solids with liquids Postural changes: Position pt fully upright for meals Oral care recommendations: Oral care BID (2x/day)    Treatment Plan Treatment Plan Treatment recommendations: Defer treatment plan to SLP at other venue (see follow-up recommendations) Follow-up recommendations: Outpatient SLP Functional status assessment: Patient has had a recent decline in their functional status and demonstrates the ability to make significant improvements in function in a reasonable and predictable amount of time. Treatment frequency: Min 2x/week Treatment duration: 2 weeks Interventions: Oropharyngeal exercises; Patient/family education; Compensatory techniques; Diet toleration management by SLP     Recommendations Recommendations for follow up therapy are one component of a multi-disciplinary discharge planning process, led by the attending physician.  Recommendations may be updated based on patient status, additional functional criteria and insurance authorization.  Assessment: Orofacial Exam: Orofacial Exam Oral Cavity: Oral Hygiene: Xerostomia Oral Cavity - Dentition: Adequate natural dentition Orofacial Anatomy: WFL Oral Motor/Sensory Function: WFL    Anatomy:  Anatomy: WFL   Boluses Administered: Boluses Administered Boluses Administered: Thin liquids (Level 0); Mildly thick liquids (Level 2, nectar thick); Moderately thick liquids (Level 3, honey thick); Puree; Solid     Oral Impairment  Domain: Oral Impairment Domain Lip Closure: No labial escape Tongue control during bolus hold: Cohesive bolus between tongue to palatal seal Bolus preparation/mastication: Timely and efficient chewing and mashing Bolus transport/lingual motion: Brisk tongue motion Oral residue: Complete oral clearance Location of oral residue : N/A Initiation of pharyngeal swallow : Posterior angle of the ramus     Pharyngeal Impairment Domain: Pharyngeal Impairment  Domain Soft palate elevation: No bolus between soft palate (SP)/pharyngeal wall (PW) Laryngeal elevation: Partial superior movement of thyroid  cartilage/partial approximation of arytenoids to epiglottic petiole Anterior hyoid excursion: Partial anterior movement Epiglottic movement: Partial inversion Laryngeal vestibule closure: Incomplete, narrow column air/contrast in laryngeal vestibule Pharyngeal stripping wave : Present - diminished Pharyngeal contraction (A/P view only): N/A Pharyngoesophageal segment opening: Partial distention/partial duration, partial obstruction of flow Tongue base retraction: Trace column of contrast or air between tongue base and PPW Pharyngeal residue: Majority of contrast within or on pharyngeal structures Location of pharyngeal residue: Valleculae     Esophageal Impairment Domain: No data recorded  Pill: No data recorded  Penetration/Aspiration Scale Score: Penetration/Aspiration Scale Score 1.  Material does not enter airway: Mildly thick liquids (Level 2, nectar thick); Moderately thick liquids (Level 3, honey thick); Puree; Solid 2.  Material enters airway, remains ABOVE vocal cords then ejected out: Thin liquids (Level 0)    Compensatory Strategies: Compensatory Strategies Compensatory strategies: Yes Effortful swallow: Effective Effective Effortful Swallow: Puree; Solid Multiple swallows: Effective Effective Multiple Swallows: Puree; Solid; Thin liquid (Level 0) Liquid wash:  Effective Effective Liquid Wash: Puree; Solid       General Information: Caregiver present: Yes   Diet Prior to this Study: Dysphagia 3 (mechanical soft); Dysphagia 2 (finely chopped); Thin liquids (Level 0)    Temperature : Normal    Respiratory Status: WFL    Supplemental O2: None (Room air)    History of Recent Intubation: No   Behavior/Cognition: Alert; Cooperative; Pleasant mood  Self-Feeding Abilities: Able to self-feed  Baseline vocal quality/speech: Normal  Volitional Cough: Able to elicit  Volitional Swallow: Able to elicit  Exam Limitations: No limitations   Goal Planning: Prognosis for improved oropharyngeal function: Fair  Barriers to Reach Goals: Time post onset; Overall medical prognosis  Barriers/Prognosis Comment: XRT 2015  Patient/Family Stated Goal: none stated  Consulted and agree with results and recommendations: Patient; Family member/caregiver   Pain: Pain Assessment Pain Assessment: No/denies pain    End of Session: Start Time:SLP Start Time (ACUTE ONLY): 1057  Stop Time: SLP Stop Time (ACUTE ONLY): 1115  Time Calculation:SLP Time Calculation (min) (ACUTE ONLY): 18 min  Charges: SLP Evaluations $ SLP Speech Visit: 1 Visit  SLP Evaluations $Outpatient MBS Swallow: 1 Procedure   SLP visit diagnosis: SLP Visit Diagnosis: Dysphagia, pharyngeal phase (R13.13)    Past Medical History:  Past Medical History:  Diagnosis Date   Amnesia    after being hitting head playing baseball, lasted 1 night- global unsure of date   Esophageal stenosis    History of radiation therapy 10/27/13- 12/23/13   R oropharynx 7000 cGy, 35 fx, R neck adenopathy 7000 cGy 35 fx, high risk nodal bed 5950 cGy 35 fx, low risk nodal bed 5600 cGy 35 fx   History of stomach ulcers ~ 1983   Malignant neoplasm of base of tongue (HCC) 09/13/13   right base of tongue- inv squamous cell    OSA on CPAP    uses CPAP nightly   Other fatigue 04/23/2014   S/P  radiation therapy 10/27/2013 through 12/23/2013                                                      Right oropharynx 7000 cGy in 35 sessions, right neck adenopathy 7000 cGy in  35 sessions.    High risk nodal bed 5950 cGy in 35 sessions, and low risk nodal bed 5600 cGy in 35 sessions                      Sinusitis 02/03/2014   Sleep apnea    uses CPAP   Thyroid  disease    hypothyroid   Tuberculosis 1984   S/P military stay in [place]; INH x 80yrs; "cleared"   Past Surgical History:  Past Surgical History:  Procedure Laterality Date   APPENDECTOMY  ~ 1967   at [facility]   CHEST WALL TUMOR EXCISION Left 1977   DIRECT LARYNGOSCOPY N/A 09/12/2013   Procedure: DIRECT LARYNGOSCOPY WITH BIOPSY ;  Surgeon: Janita Mellow, MD;  Location: [facility] OR;  Service: ENT;  Laterality: N/A;   DIRECT LARYNGOSCOPY N/A 01/12/2015   Procedure: DIRECT LARYNGOSCOPY;  Surgeon: Janita Mellow, MD;  Location: [facility];  Service: ENT;  Laterality: N/A;   ESOPHAGOSCOPY N/A 09/12/2013   Procedure: ESOPHAGOSCOPY;  Surgeon: Janita Mellow, MD;  Location: [facility] OR;  Service: ENT;  Laterality: N/A;   ESOPHAGOSCOPY WITH DILITATION N/A 01/12/2015   Procedure: ESOPHAGOSCOPY WITH DILITATION;  Surgeon: Janita Mellow, MD;  Location: [facility];  Service: ENT;  Laterality: N/A;   FOOT FRACTURE SURGERY Left ~ 1995   GASTROJEJUNOSTOMY N/A 10/17/2013   Procedure: LAPAROSCOPIC GASTROJEJUNOSTOMY;  Surgeon: Lockie Rima, MD;  Location: [facility] OR;  Service: General;  Laterality: N/A;  Dr. Vann Gent card was used please make a preference card for Dr. Cherlynn Cornfield.   GASTROSTOMY TUBE PLACEMENT Left 10/17/2013   HEEL SPUR SURGERY Left ~ 1995   "shaved bone down; took nerve out"   INTERPHALANGEAL JOINT ARTHROPLASTY Right ` 2008   "middle"   PORTACATH PLACEMENT Left 10/17/2013   power port   PORTACATH PLACEMENT Left 10/17/2013   Procedure: INSERTION PORT-A-CATH;  Surgeon: Lockie Rima, MD;  Location: [facility] OR;  Service: General;  Laterality: Left;    TONGUE BIOPSY  09/2013   "@ base of tongue"   UPPER GASTROINTESTINAL ENDOSCOPY     VASECTOMY  1982    Joshu Furukawa A Elberta Lachapelle 07/18/2023, 2:00 PM

## 2023-07-18 NOTE — Therapy (Unsigned)
 OUTPATIENT SPEECH LANGUAGE PATHOLOGY TREATMENT NOTE   Patient Name: [name] Wu MRN: [id number] DOB:[birth year], 68 y.o., male Today's Date: 07/19/2023  PCP: Eddie Good Peru, Alonza Jansky, MD REFERRING PROVIDER: Ledon Pry, MD  END OF SESSION:  End of Session - 07/19/23 1134     Visit Number 4    Number of Visits 9    Date for SLP Re-Evaluation 08/30/23    Authorization Type [facility]    Authorization - Visit Number 4    Authorization - Number of Visits 15    SLP Start Time 1145    SLP Stop Time  1215    SLP Time Calculation (min) 30 min    Activity Tolerance Patient tolerated treatment well              Past Medical History:  Diagnosis Date   Amnesia    after being hitting head playing baseball, lasted 1 night- global unsure of date   Esophageal stenosis    History of radiation therapy 10/27/13- 12/23/13   R oropharynx 7000 cGy, 35 fx, R neck adenopathy 7000 cGy 35 fx, high risk nodal bed 5950 cGy 35 fx, low risk nodal bed 5600 cGy 35 fx   History of stomach ulcers ~ 1983   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    High risk nodal bed 5950 cGy in 35 sessions, and low risk nodal bed 5600 cGy in 35 sessions                      Sinusitis 02/03/2014   Sleep apnea    uses CPAP   Thyroid  disease    hypothyroid   Tuberculosis 1984   S/P military stay in [place]; INH x 43yrs; cleared   Past Surgical History:  Procedure Laterality Date   APPENDECTOMY  ~ 1967   at [facility]   CHEST WALL TUMOR EXCISION Left 1977   DIRECT LARYNGOSCOPY N/A 09/12/2013   Procedure: DIRECT LARYNGOSCOPY WITH BIOPSY ;  Surgeon: Janita Mellow, MD;  Location: [facility] OR;  Service: ENT;  Laterality: N/A;   DIRECT  LARYNGOSCOPY N/A 01/12/2015   Procedure: DIRECT LARYNGOSCOPY;  Surgeon: Janita Mellow, MD;  Location: [facility];  Service: ENT;  Laterality: N/A;   ESOPHAGOSCOPY N/A 09/12/2013   Procedure: ESOPHAGOSCOPY;  Surgeon: Janita Mellow, MD;  Location: [facility] OR;  Service: ENT;  Laterality: N/A;   ESOPHAGOSCOPY WITH DILITATION N/A 01/12/2015   Procedure: ESOPHAGOSCOPY WITH DILITATION;  Surgeon: Janita Mellow, MD;  Location: [facility];  Service: ENT;  Laterality: N/A;   FOOT FRACTURE SURGERY Left ~ 1995   GASTROJEJUNOSTOMY N/A 10/17/2013   Procedure: LAPAROSCOPIC GASTROJEJUNOSTOMY;  Surgeon: Lockie Rima, MD;  Location: [facility] OR;  Service: General;  Laterality: N/A;  Dr. Vann Gent card was used please make a preference card for Dr. Cherlynn Cornfield.   GASTROSTOMY TUBE PLACEMENT Left 10/17/2013   HEEL SPUR SURGERY  Left ~ 1995   shaved bone down; took nerve out   INTERPHALANGEAL JOINT ARTHROPLASTY Right ` 2008   middle   PORTACATH PLACEMENT Left 10/17/2013   power port   PORTACATH PLACEMENT Left 10/17/2013   Procedure: INSERTION PORT-A-CATH;  Surgeon: Lockie Rima, MD;  Location: [facility] OR;  Service: General;  Laterality: Left;   TONGUE BIOPSY  09/2013   @ base of tongue   UPPER GASTROINTESTINAL ENDOSCOPY     VASECTOMY  1982   Patient Active Problem List   Diagnosis Date Noted   Actinic keratosis 06/06/2023   Exposure to potentially hazardous substance 06/06/2023   Finger injury 06/06/2023   Hearing loss 06/06/2023   Prediabetes 10/26/2022   Family history of Alzheimer's disease 10/26/2022   Difficulty swallowing pills 07/29/2022   Elevated blood pressure reading in office without diagnosis of hypertension 07/29/2022   OSA on CPAP 07/29/2022   Peripheral neuropathy due to chemotherapy (HCC) 07/29/2022   Pain in finger of left hand 07/09/2019   History of head and neck radiation 05/04/2017   History of cancer tonsil 05/29/2015   Xerostomia 07/24/2014   Hypothyroidism (acquired) 07/24/2014    Other fatigue 04/23/2014   Sinusitis 02/03/2014   Secondary lymphedema 02/03/2014   Mucositis due to antineoplastic therapy 01/06/2014   Anemia in neoplastic disease 12/09/2013   Exposed mandible 11/18/2013   Nausea without vomiting 11/15/2013   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 He received 37 rounds of XRT and 3 chemo.   PAIN:  Are you having pain? No  FALLS: Has patient fallen in last 6 months?  No  LIVING ENVIRONMENT: Lives with: lives with their spouse Lives in: House/apartment  PLOF:  Level of assistance: Independent with ADLs, Independent with IADLs Employment: Full-time employment  PATIENT GOALS: I want to swallow better  OBJECTIVE:  Note: Objective measures were completed at Evaluation unless otherwise noted. OBJECTIVE:   DIAGNOSTIC FINDINGS: IMPRESSION Esophagram 2016:  1. Lower cervical esophageal web does somewhat narrow the lumen of the lower cervical esophagus. 2. Moderate gastroesophageal reflux. The patient could not swallow the pill at the end of the study.   MBSS Clinical Impression (07/18/23): Clinical Impression: Patient presents with mild pharyngeal swallowing impairment characterized primarily by impaired pharyngeal efficiency status post remote XRT for base of tongue cancer in 2015. As often seen with HNC, patient is most likely exhibiting sequelae of radiation to the  pharyngeal area including tissue fibrosis, which impairs movement of the pharyngeal swallowing structures. As a result, patient demonstrates reduced hyolaryngeal excursion/elevation, epiglottic inversion, and pharyngeal constriction resulting in collection of residue primarily in the vallecula which increases in amount alongside bolus viscosity. Effortful swallows, liquid wash, and multiple swallows are somewhat effective in mitigating residue, though no strategy fully clears the vallecula and patient was observed to expectorate residue intermittently. Patient independently compensates further by taking small bites and chewing thoroughly. Recommend continuation of preferred items, utilizing all above mentioned strategies. Recommend cutting small medications  in half and taking them with purees/pudding to ease pharyngeal transit. Continue with OTPT SLP.  PATIENT REPORTED OUTCOME MEASURES (PROM): EAT-10: 20 - rated 4, or severe problem for swallowing is stressful; rated 3 for difficulty eating out, swallowing takes extra effort, pleasure of eating is affected by swallowing difficulty                                                                                                                           TREATMENT DATE:  07/19/23: Reviewed yesterday's MBSS results and recommendations (see above). Endorsed carryover of swallow precautions and HEP. Today, targeted 100 hard/fast swallows with tsp sips of water. Endorsed fatigue at halfway point. Coughing and throat clearing increased as task progressed. Cued increased intensity of cough for airway protection. Pt may cancel remaining session d/t ability to complete exercises independently.   07/12/23: Grattan has been completing HEP as able at home - he continues to struggle with dry mouth due to missing saliva gland. Effortful swallow - 1 sip and 2 dry total of 30 reps. Masako required liquid sips every 2 reps and occasional verbal cues to keep tongue between teeth.  CTAR with rare min A for 45 second holds and 2 sets of 20 reps of nods. Mendelson required frequent max A to achieve 2/10 attempts. Reviewed general swallow precautions and educated pt on what to expect during MBSS next week.   07/07/23: Led pt through dysphagia exercises with pt completing the following: Effortful swallow: 30 reps in sets of 3 (1 small thin liquid sip + 2 dray swallows) Masako: x5. Pt reporting challenges eliciting swallow while keeping tongue anchored  Mendelsohn: pt able to achieve x3 using tactile cues and verbal cues to maintain strong lingual elevation CTAR: pt demonstrating exercise but is not able to sense any targeted musculature working. Had pt complete shaker exercise alternative and appears pt is completing correctly but again has reduced sensation.   Discussed importance of completing HEP BID consistency d/t pervasive nature of post radiation dysphagia. Pt reports challenges d/t being very busy but will try to implement routine.   07/05/23: Eval completed - initiated training in HEP for dysphagia and to lessen late effects of XRT. Effortful swallow completed with occasional min A10/10x; Masako with usual mod A 2/8x; CTAR hold for 45 sec and 20 reps with occasional min A; Mendelson 0/7 attempts with max tactile and visual A; modified Shakers demonstrated due to time. Educated re: swallowing with intent which he reports he does consistently.     PATIENT EDUCATION: Education details: HEP for dysphagia; general swallow precautions, recommend MBSS - See Treatment, See patient instructions Person educated: Patient Education method: Explanation, Demonstration, Verbal cues, and Handouts Education comprehension: verbalized understanding, returned demonstration, verbal cues required, and needs further education   ASSESSMENT:  CLINICAL IMPRESSION: Patient is a 68 y.o. male who was seen today for oropharyngeal dysphagia. He reports globus, regurgitation. Unable to tolerate  meats, breads, cakes. Has to take synthroid  with Pepsi  only to facilitate passage of pill. Difficulty eating out - usually orders baked potato only. Weight is stable, denies h/o pna. Does have h/o of cervical esophageal web per esophagram 2016, as well as moderate GERD. He is currently not taking anti-reflux meds.  He takes 2 Ensure a day for weight maintenance. MBSS rescheduled for Tuesday July 18, 2023. I recommend skilled ST to maximize safety of swallow to reduce risk of aspiration pna, weight loss and choking. Consider referral of laryngologist pending results of MBSS - will need [facility] auth for this.   OBJECTIVE IMPAIRMENTS: include dysphagia. These impairments are limiting patient from safety when swallowing. Factors affecting potential to achieve goals and functional outcome are previous level of function and severity of impairments. Patient will benefit from skilled SLP services to address above impairments and improve overall function.  REHAB POTENTIAL: Good   GOALS: Goals reviewed with patient? Yes   LONG TERM GOALS: Target date: 08/30/23  Pt will complete HEP for dysphagia with mod I Baseline:  Goal status: INITIAL  2.  Pt will verbalize need to continue HEP 5/7 days a week after d/c from ST Baseline:  Goal status: INITIAL  3.  Pt will follow swallow precautions with mod I Baseline:  Goal status: INITIAL  4.  Pt will follow diet modifications with mod I Baseline:  Goal status: INITIAL  5.  Pt will verbalize 3 reflux precautions with mod I Baseline:  Goal status: INITIAL  PLAN:  SLP FREQUENCY: 2x/week  SLP DURATION: 4 weeks  PLANNED INTERVENTIONS: Aspiration precaution training, Pharyngeal strengthening exercises, Diet toleration management , Environmental controls, Trials of upgraded texture/liquids, Cueing hierachy, Internal/external aids, SLP instruction and feedback, Compensatory strategies, Patient/family education, [phone] Treatment of swallowing function, and  MBSS    Tamar Fairly, CCC-SLP 07/19/2023, 11:35 AM

## 2023-07-19 ENCOUNTER — Ambulatory Visit

## 2023-07-19 DIAGNOSIS — R1312 Dysphagia, oropharyngeal phase: Secondary | ICD-10-CM

## 2023-07-19 NOTE — Patient Instructions (Signed)
 Goals: 100 swallows per day

## 2023-07-26 ENCOUNTER — Encounter

## 2023-07-31 ENCOUNTER — Ambulatory Visit (INDEPENDENT_AMBULATORY_CARE_PROVIDER_SITE_OTHER): Admitting: Neurology

## 2023-07-31 DIAGNOSIS — R202 Paresthesia of skin: Secondary | ICD-10-CM

## 2023-07-31 DIAGNOSIS — G629 Polyneuropathy, unspecified: Secondary | ICD-10-CM

## 2023-07-31 NOTE — Procedures (Signed)
 [facility]  [address] [place], [address]  [place], [place] [address] Tel: [phone] Fax: [phone] Test Date:  07/31/2023  Patient: [name] Wu DOB: [birth year] Physician: Venetia Potters, MD  Sex: Male Height: 5' 5 Ref Phys: Hargis Catchings, MD  ID#: [id number]   Technician:    History: This is a 68 year old male with pain in bilateral lower limbs.  NCV & EMG Findings: Extensive electrodiagnostic evaluation of the right lower limb with additional nerve conductions studies in the right upper limb and left lower limb show: Bilateral sural and bilateral superficial peroneal/fibular sensory responses are absent. Right median and ulnar sensory responses are within normal limits. Right peroneal/fibular (EDB) and tibial (AH) motor responses are within normal limits. Right H reflex latency is within normal limits. Chronic motor axon loss changes without accompanying active denervation changes are seen in the right flexor digitorum longus muscle. All other tested muscles are within normal limits.  Impression: This is an abnormal study. 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 No electrodiagnostic evidence of a right lumbosacral radiculopathy (L3-S1).    ___________________________ Venetia Potters, MD    Nerve Conduction Studies Motor Nerve Results    Latency Amplitude F-Lat Segment Distance CV Comment  Site (ms) Norm (mV) Norm (ms)  (cm) (m/s) Norm   Right Fibular (EDB) Motor  Ankle 3.4  < 6.0 3.8  > 2.5        Bel fib head 10.0 - 3.6 -  Bel fib head-Ankle 28.5 43  > 40   Pop fossa 12.0 - 3.6 -  Pop fossa-Bel fib head 9 45 -   Right Tibial (AH) Motor  Ankle 3.2  < 6.0 6.0  > 4.0        Knee 11.6 - 4.5 -  Knee-Ankle 39.5 47  > 40    Sensory Sites    Neg Peak Lat Amplitude (O-P) Segment Distance Velocity Comment  Site (ms) Norm (V) Norm  (cm) (ms)   Right  Median Sensory  Wrist-Dig II 3.7  < 3.8 14  > 10 Wrist-Dig II 13    Left Superficial Fibular Sensory  14 cm-Ankle *NR  < 4.6 *NR  > 3 14 cm-Ankle 14    Right Superficial Fibular Sensory  14 cm-Ankle *NR  < 4.6 *NR  > 3 14 cm-Ankle 14    Left Sural Sensory  Calf-Lat mall *NR  < 4.6 *NR  > 3 Calf-Lat mall 14    Right Sural Sensory  Calf-Lat mall *NR  < 4.6 *NR  > 3 Calf-Lat mall 14    Right Ulnar Sensory  Wrist-Dig V 3.0  < 3.2 8  > 5 Wrist-Dig V 11     H-Reflex Results    M-Lat H Lat H Neg Amp H-M Lat  Site (ms) (ms) Norm (mV) (ms)  Right Tibial H-Reflex  Pop fossa 5.5 32.6  < 35.0 0.83 27.1   Electromyography   Side Muscle Ins.Act Fibs Fasc Recrt Amp Dur Poly Activation Comment  Right Tib ant Nml Nml Nml Nml Nml Nml Nml Nml N/A  Right Gastroc MH Nml Nml Nml Nml Nml Nml Nml Nml N/A  Right FDL Nml Nml Nml *1- *1+ *1+ Nml Nml N/A  Right Rectus fem Nml Nml Nml Nml Nml Nml Nml Nml N/A  Right Biceps fem SH Nml Nml Nml Nml Nml Nml Nml Nml N/A  Right Gluteus med Nml  Nml Nml Nml Nml Nml Nml Nml N/A      Waveforms:  Motor      Sensory               H-Reflex

## 2023-08-02 ENCOUNTER — Ambulatory Visit

## 2023-08-02 DIAGNOSIS — R1312 Dysphagia, oropharyngeal phase: Secondary | ICD-10-CM | POA: Diagnosis not present

## 2023-08-02 NOTE — Therapy (Signed)
 OUTPATIENT SPEECH LANGUAGE PATHOLOGY TREATMENT NOTE (DISCHARGE)   Patient Name: [name] Wu MRN: [id number] DOB:05-16-1955, 68 y.o., male Today's Date: 08/02/2023  PCP: everitt Peru, Quintin PARAS, MD REFERRING PROVIDER: Cesar Norris, MD  END OF SESSION:  End of Session - 08/02/23 1133     Visit Number 5    Number of Visits 9    Date for SLP Re-Evaluation 08/30/23    Authorization Type [facility]    Authorization Time Period 05/14/23 to 11/13/22    SLP Start Time 1145    SLP Stop Time  1200    SLP Time Calculation (min) 15 min    Activity Tolerance Patient tolerated treatment well         SPEECH THERAPY DISCHARGE SUMMARY  Visits from Start of Care: 5  Current functional level related to goals / functional outcomes: Chronic dysphagia d/t HNC. Pt endorsed limited benefit of swallow HEP and compensations at this time. Recommended continuing HEP to maximize swallow function with pt plan to continue independently.    Remaining deficits: Dysphagia   Education / Equipment: HEP, swallow exercises, MBSS results and recommendations, compensatory training    Patient agrees to discharge. Patient goals were met. Patient is being discharged due to the patient's request.     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    High risk nodal bed 5950 cGy in 35 sessions, and low risk nodal bed 5600 cGy in 35 sessions                      Sinusitis 02/03/2014   Sleep apnea    uses CPAP   Thyroid  disease    hypothyroid   Tuberculosis 1984   S/P military stay in [place]; INH x 78yrs; cleared   Past Surgical History:  Procedure Laterality Date   APPENDECTOMY  ~ 1967   at [facility]   CHEST WALL TUMOR EXCISION Left 1977   DIRECT LARYNGOSCOPY N/A 09/12/2013   Procedure: DIRECT LARYNGOSCOPY WITH BIOPSY ;  Surgeon: Ida Loader, MD;  Location: [facility] OR;  Service: ENT;  Laterality: N/A;   DIRECT LARYNGOSCOPY N/A 01/12/2015   Procedure: DIRECT LARYNGOSCOPY;  Surgeon: Ida Loader, MD;  Location: [facility];  Service: ENT;  Laterality: N/A;   ESOPHAGOSCOPY N/A 09/12/2013   Procedure: ESOPHAGOSCOPY;  Surgeon: Ida Loader, MD;  Location: [facility] OR;  Service: ENT;  Laterality: N/A;   ESOPHAGOSCOPY WITH DILITATION N/A 01/12/2015  Procedure: ESOPHAGOSCOPY WITH DILITATION;  Surgeon: Ida Loader, MD;  Location: [facility];  Service: ENT;  Laterality: N/A;   FOOT FRACTURE SURGERY Left ~ 1995   GASTROJEJUNOSTOMY N/A 10/17/2013   Procedure: LAPAROSCOPIC GASTROJEJUNOSTOMY;  Surgeon: Jina Nephew, MD;  Location: [facility] OR;  Service: General;  Laterality: N/A;  Dr. Rubie card was used please make a preference card for Dr. Nephew.   GASTROSTOMY TUBE PLACEMENT Left 10/17/2013   HEEL SPUR SURGERY Left ~ 1995   shaved bone down; took nerve out   INTERPHALANGEAL JOINT ARTHROPLASTY Right ` 2008   middle   PORTACATH PLACEMENT Left 10/17/2013   power port   PORTACATH PLACEMENT Left 10/17/2013   Procedure: INSERTION PORT-A-CATH;  Surgeon: Jina Nephew, MD;  Location: [facility] OR;  Service: General;  Laterality: Left;   TONGUE BIOPSY  09/2013   @ base of tongue   UPPER GASTROINTESTINAL ENDOSCOPY     VASECTOMY  1982   Patient Active Problem List   Diagnosis Date Noted   Actinic keratosis 06/06/2023   Exposure to  potentially hazardous substance 06/06/2023   Finger injury 06/06/2023   Hearing loss 06/06/2023   Prediabetes 10/26/2022   Family history of Alzheimer's disease 10/26/2022   Difficulty swallowing pills 07/29/2022   Elevated blood pressure reading in office without diagnosis of hypertension 07/29/2022   OSA on CPAP 07/29/2022   Peripheral neuropathy due to chemotherapy (HCC) 07/29/2022   Pain in finger of left hand 07/09/2019   History of head and neck radiation 05/04/2017   History of cancer tonsil 05/29/2015   Xerostomia 07/24/2014   Hypothyroidism (acquired) 07/24/2014   Other fatigue 04/23/2014   Sinusitis 02/03/2014   Secondary lymphedema 02/03/2014   Mucositis due to antineoplastic therapy 01/06/2014   Anemia in neoplastic disease 12/09/2013   Exposed mandible 11/18/2013   Nausea without vomiting 11/15/2013   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 He received 37 rounds of XRT and 3 chemo.   PAIN:  Are you having pain? No  FALLS: Has patient fallen in last 6 months?  No  LIVING ENVIRONMENT: Lives with: lives with their spouse Lives in: House/apartment  PLOF:  Level of assistance: Independent with ADLs, Independent with IADLs Employment: Full-time employment  PATIENT GOALS: I want to swallow better  OBJECTIVE:  Note: Objective measures were completed at Evaluation unless otherwise noted. OBJECTIVE:   DIAGNOSTIC  FINDINGS: IMPRESSION Esophagram 2016:  1. Lower cervical esophageal web does somewhat narrow the lumen of the lower cervical esophagus. 2. Moderate gastroesophageal reflux. The patient could not swallow the pill at the end of the study.   MBSS Clinical Impression (07/18/23): Clinical Impression: Patient presents with mild pharyngeal swallowing impairment characterized primarily by impaired pharyngeal efficiency status post remote XRT for base of tongue cancer in 2015. As often seen with HNC, patient is most likely exhibiting sequelae of radiation to the pharyngeal area including tissue fibrosis, which impairs movement of the pharyngeal swallowing structures. As a result, patient demonstrates reduced hyolaryngeal excursion/elevation, epiglottic  inversion, and pharyngeal constriction resulting in collection of residue primarily in the vallecula which increases in amount alongside bolus viscosity. Effortful swallows, liquid wash, and multiple swallows are somewhat effective in mitigating residue, though no strategy fully clears the vallecula and patient was observed to expectorate residue intermittently. Patient independently compensates further by taking small bites and chewing thoroughly. Recommend continuation of preferred items, utilizing all above mentioned strategies. Recommend cutting small medications in half and taking them with purees/pudding to ease pharyngeal transit. Continue with OTPT SLP.  PATIENT REPORTED OUTCOME MEASURES (PROM): EAT-10: 20 - rated 4, or severe problem for swallowing is stressful; rated 3 for difficulty eating out, swallowing takes extra effort, pleasure of eating is affected by swallowing difficulty                                                                                                                           TREATMENT DATE:  08/02/23: Reported persistent choking on varied textures. Denied need for Heimlich maneuver, with reported sufficient ability to cough and  clear airway. Recommended Life Vac device in case of emergency, given desire to continue PO diet. Pt believes MBSS was not adequate reflection of his true swallow function, with SLP recommending FEES for additional information. Pt declined FEES at this time. Reported ongoing carryover of 100 swallows and HEP exercises, with SLP educating pt on recommendation to continue daily exercises and advance as able. Given chronic dysphagia, pt believes he is at baseline and denied need for additional ST services at this time. May benefit from future goal planning discussions with MD and/or nutritionist.   07/19/23: Reviewed yesterday's MBSS results and recommendations (see above). Endorsed carryover of swallow precautions and HEP. Today, targeted 100 hard/fast swallows with tsp sips of water. Endorsed fatigue at halfway point. Coughing and throat clearing increased as task progressed. Cued increased intensity of cough for airway protection. Pt may cancel remaining session d/t ability to complete exercises independently.   07/12/23: [place] has been completing HEP as able at home - he continues to struggle with dry mouth due to missing saliva gland. Effortful swallow - 1 sip and 2 dry total of 30 reps. Masako required liquid sips every 2 reps and occasional verbal cues to keep tongue between teeth. CTAR with rare min A for 45 second holds and 2 sets of 20 reps of nods. Mendelson required frequent max A to achieve 2/10 attempts. Reviewed general swallow precautions and educated pt on what to expect during MBSS next week.   07/07/23: Led pt through dysphagia exercises with pt completing the following: Effortful swallow: 30 reps in sets of 3 (1 small thin liquid sip + 2 dray swallows) Masako: x5. Pt reporting challenges eliciting swallow while keeping tongue anchored  Mendelsohn: pt able to achieve x3 using tactile cues and verbal cues to maintain strong lingual elevation CTAR: pt demonstrating exercise but is not able to  sense any targeted musculature working. Had pt complete shaker exercise alternative and appears pt  is completing correctly but again has reduced sensation.   Discussed importance of completing HEP BID consistency d/t pervasive nature of post radiation dysphagia. Pt reports challenges d/t being very busy but will try to implement routine.   07/05/23: Eval completed - initiated training in HEP for dysphagia and to lessen late effects of XRT. Effortful swallow completed with occasional min A10/10x; Masako with usual mod A 2/8x; CTAR hold for 45 sec and 20 reps with occasional min A; Mendelson 0/7 attempts with max tactile and visual A; modified Shakers demonstrated due to time. Educated re: swallowing with intent which he reports he does consistently.     PATIENT EDUCATION: Education details: HEP for dysphagia; general swallow precautions, recommend MBSS - See Treatment, See patient instructions Person educated: Patient Education method: Explanation, Demonstration, Verbal cues, and Handouts Education comprehension: verbalized understanding, returned demonstration, verbal cues required, and needs further education   ASSESSMENT:  CLINICAL IMPRESSION: Patient is a 68 y.o. male who was seen today for oropharyngeal dysphagia. He reports ongoing globus sensation and regurgitation. Unable to tolerate meats, breads, cakes. Denies h/o pna. Does have h/o of cervical esophageal web per esophagram 2016, as well as moderate GERD. He takes 2 Ensure a day for weight maintenance. Completed education and instruction of swallow HEP and precautions to maximize swallow function. Pt requested ST discharge this date with plans to continue HEP independently.   OBJECTIVE IMPAIRMENTS: include dysphagia. These impairments are limiting patient from safety when swallowing. Factors affecting potential to achieve goals and functional outcome are previous level of function and severity of impairments. Patient will benefit from  skilled SLP services to address above impairments and improve overall function.  REHAB POTENTIAL: Good   GOALS: Goals reviewed with patient? Yes   LONG TERM GOALS: Target date: 08/30/23  Pt will complete HEP for dysphagia with mod I Baseline:  Goal status: MET  2.  Pt will verbalize need to continue HEP 5/7 days a week after d/c from ST Baseline:  Goal status: MET  3.  Pt will follow swallow precautions with mod I Baseline:  Goal status: MET  4.  Pt will follow diet modifications with mod I Baseline:  Goal status: MET  5.  Pt will verbalize 3 reflux precautions with mod I Baseline:  Goal status: DEFERRED  PLAN:  SLP FREQUENCY: 2x/week  SLP DURATION: 4 weeks  PLANNED INTERVENTIONS: Aspiration precaution training, Pharyngeal strengthening exercises, Diet toleration management , Environmental controls, Trials of upgraded texture/liquids, Cueing hierachy, Internal/external aids, SLP instruction and feedback, Compensatory strategies, Patient/family education, [phone] Treatment of swallowing function, and MBSS    Comer LILLETTE Louder, CCC-SLP 08/02/2023, 12:03 PM

## 2023-08-09 ENCOUNTER — Encounter (HOSPITAL_BASED_OUTPATIENT_CLINIC_OR_DEPARTMENT_OTHER): Payer: Self-pay | Admitting: *Deleted

## 2023-08-17 ENCOUNTER — Other Ambulatory Visit (HOSPITAL_BASED_OUTPATIENT_CLINIC_OR_DEPARTMENT_OTHER): Payer: Self-pay | Admitting: *Deleted

## 2023-08-17 DIAGNOSIS — E039 Hypothyroidism, unspecified: Secondary | ICD-10-CM

## 2023-08-17 MED ORDER — LEVOTHYROXINE SODIUM 125 MCG PO TABS: 125.0000 ug | ORAL_TABLET | Freq: Every day | ORAL | 1 refills | Status: DC

## 2023-10-20 ENCOUNTER — Encounter (HOSPITAL_BASED_OUTPATIENT_CLINIC_OR_DEPARTMENT_OTHER): Payer: Self-pay | Admitting: Family Medicine

## 2023-10-23 ENCOUNTER — Other Ambulatory Visit (HOSPITAL_BASED_OUTPATIENT_CLINIC_OR_DEPARTMENT_OTHER): Payer: Self-pay | Admitting: *Deleted

## 2023-10-23 DIAGNOSIS — E039 Hypothyroidism, unspecified: Secondary | ICD-10-CM

## 2023-10-23 MED ORDER — LEVOTHYROXINE SODIUM 125 MCG PO TABS: 125.0000 ug | ORAL_TABLET | Freq: Every day | ORAL | 2 refills | Status: DC

## 2023-12-22 ENCOUNTER — Other Ambulatory Visit (HOSPITAL_BASED_OUTPATIENT_CLINIC_OR_DEPARTMENT_OTHER): Payer: Self-pay | Admitting: *Deleted

## 2023-12-22 DIAGNOSIS — E039 Hypothyroidism, unspecified: Secondary | ICD-10-CM

## 2023-12-22 MED ORDER — LEVOTHYROXINE SODIUM 125 MCG PO TABS
125.0000 ug | ORAL_TABLET | Freq: Every day | ORAL | 3 refills | Status: AC
Start: 2023-12-22 — End: ?

## 2024-03-20 ENCOUNTER — Ambulatory Visit: Admitting: Podiatry
